# Patient Record
Sex: Female | Born: 1965 | Race: Black or African American | Hispanic: No | Marital: Single | Smoking: Light tobacco smoker
Health system: Southern US, Community
[De-identification: ages and names within clinical notes are randomized; demographics above are authoritative.]

## PROBLEM LIST (undated history)

## (undated) DIAGNOSIS — R7303 Prediabetes: Secondary | ICD-10-CM

## (undated) DIAGNOSIS — E669 Obesity, unspecified: Secondary | ICD-10-CM

## (undated) DIAGNOSIS — G4733 Obstructive sleep apnea (adult) (pediatric): Secondary | ICD-10-CM

## (undated) DIAGNOSIS — E119 Type 2 diabetes mellitus without complications: Secondary | ICD-10-CM

## (undated) DIAGNOSIS — I10 Essential (primary) hypertension: Secondary | ICD-10-CM

## (undated) DIAGNOSIS — E785 Hyperlipidemia, unspecified: Secondary | ICD-10-CM

## (undated) DIAGNOSIS — Z720 Tobacco use: Secondary | ICD-10-CM

## (undated) DIAGNOSIS — G4730 Sleep apnea, unspecified: Secondary | ICD-10-CM

## (undated) HISTORY — DX: Obstructive sleep apnea (adult) (pediatric): G47.33

## (undated) HISTORY — DX: Prediabetes: R73.03

## (undated) HISTORY — DX: Hyperlipidemia, unspecified: E78.5

## (undated) HISTORY — DX: Obesity, unspecified: E66.9

## (undated) HISTORY — DX: Tobacco use: Z72.0

## (undated) HISTORY — DX: Sleep apnea, unspecified: G47.30

## (undated) HISTORY — DX: Type 2 diabetes mellitus without complications: E11.9

---

## 2012-06-30 ENCOUNTER — Emergency Department (INDEPENDENT_AMBULATORY_CARE_PROVIDER_SITE_OTHER)
Admission: EM | Admit: 2012-06-30 | Discharge: 2012-06-30 | Disposition: A | Discharge status: Home / Self Care | Payer: Self-pay | Source: Home / Self Care | Attending: Family Medicine | Admitting: Family Medicine

## 2012-06-30 ENCOUNTER — Encounter (HOSPITAL_COMMUNITY): Payer: Self-pay

## 2012-06-30 DIAGNOSIS — I10 Essential (primary) hypertension: Secondary | ICD-10-CM

## 2012-06-30 HISTORY — DX: Essential (primary) hypertension: I10

## 2012-06-30 LAB — COMPREHENSIVE METABOLIC PANEL
BUN: 10 mg/dL (ref 6–23)
CO2: 27 mEq/L (ref 19–32)
Chloride: 103 mEq/L (ref 96–112)
Creatinine, Ser: 0.65 mg/dL (ref 0.50–1.10)
GFR calc non Af Amer: >90 mL/min (ref >90)
Glucose, Bld: 88 mg/dL (ref 70–99)
Total Bilirubin: 0.3 mg/dL (ref 0.3–1.2)

## 2012-06-30 LAB — LIPID PANEL
Cholesterol: 200 mg/dL (ref 0–200)
Triglycerides: 152 mg/dL — ABNORMAL HIGH (ref <150)

## 2012-06-30 LAB — CBC
HCT: 40.4 % (ref 36.0–46.0)
Hemoglobin: 13.9 g/dL (ref 12.0–15.0)
MCH: 33.7 pg (ref 26.0–34.0)
MCHC: 34.4 g/dL (ref 30.0–36.0)

## 2012-06-30 MED ORDER — METOPROLOL TARTRATE 50 MG PO TABS: 50.0000 mg | ORAL_TABLET | Freq: Every day | ORAL | Status: DC

## 2012-06-30 MED ORDER — LISINOPRIL 20 MG PO TABS: 20.0000 mg | ORAL_TABLET | Freq: Every day | ORAL | Status: DC

## 2012-06-30 MED ORDER — AMLODIPINE BESYLATE 5 MG PO TABS: 5.0000 mg | ORAL_TABLET | Freq: Every day | ORAL | Status: DC

## 2012-06-30 NOTE — ED Notes (Signed)
Patient states has a history of hypertension -needs medication refill

## 2012-06-30 NOTE — ED Provider Notes (Signed)
History     CSN: [id number]  Arrival date & time 06/30/12  1559   First MD Initiated Contact with Patient 06/30/12 1600      Chief Complaint  Patient presents with  . Medication Refill    (Consider location/radiation/quality/duration/timing/severity/associated sxs/prior treatment) HPI Pt says that she is out of her medications.  She takes 3 different BP medications. Pt says that she just relocated to this area.  Pt says that she is not having any other symptoms.     Past Medical History  Diagnosis Date  . Hypertension    History reviewed. No pertinent past surgical history.  No family history on file.  History  Substance Use Topics  . Smoking status: Light Tobacco Smoker  . Smokeless tobacco: Not on file  . Alcohol Use: Yes    OB History    Grav Para Term Preterm Abortions TAB SAB Ect Mult Living                  Review of Systems  Constitutional: Positive for fatigue.  Neurological: Positive for light-headedness and headaches.  All other systems reviewed and are negative.    Allergies  Review of patient's allergies indicates no known allergies.  Home Medications   Current Outpatient Rx  Name  Route  Sig  Dispense  Refill  . AMLODIPINE BESYLATE 5 MG PO TABS   Oral   Take 5 mg by mouth daily.         Marland Kitchen LISINOPRIL 20 MG PO TABS   Oral   Take 20 mg by mouth daily.         Marland Kitchen METOPROLOL TARTRATE 50 MG PO TABS   Oral   Take 50 mg by mouth once.           BP 151/91  Pulse 84  Temp 98.5 F (36.9 C) (Oral)  Resp 16  SpO2 100%  Physical Exam  Nursing note and vitals reviewed. Constitutional: She is oriented to person, place, and time. She appears well-developed and well-nourished.  HENT:  Head: Normocephalic and atraumatic.  Eyes: EOM are normal. Pupils are equal, round, and reactive to light.  Neck: Normal range of motion. Neck supple. No JVD present. No tracheal deviation present. No thyromegaly present.  Cardiovascular: Normal rate,  regular rhythm and normal heart sounds.   Pulmonary/Chest: Effort normal and breath sounds normal.  Abdominal: Soft. Bowel sounds are normal.  Musculoskeletal: Normal range of motion. She exhibits no edema and no tenderness.  Lymphadenopathy:    She has no cervical adenopathy.  Neurological: She is alert and oriented to person, place, and time. She has normal reflexes.  Skin: Skin is warm and dry.  Psychiatric: She has a normal mood and affect. Judgment and thought content normal.   ED Course  Procedures (including critical care time)  Labs Reviewed - No data to display No results found.  No diagnosis found.   MDM  IMPRESSION  Hypertension  Hyperlipidemia  RECOMMENDATIONS / PLAN Refilled medications today. Follow up on results.  Send for old records.   FOLLOW UP 2 months   The patient was given clear instructions to go to ER or return to medical center if symptoms don't improve, worsen or new problems develop.  The patient verbalized understanding.  The patient was told to call to get lab results if they haven't heard anything in the next week.            Cleora Fleet, MD 06/30/12 1826

## 2012-07-01 ENCOUNTER — Telehealth (HOSPITAL_COMMUNITY): Payer: Self-pay

## 2012-07-01 NOTE — ED Notes (Signed)
Received records  [place]

## 2012-07-01 NOTE — Progress Notes (Signed)
Quick Note:  Please notify patient that her labs came back okay. She should have her labs rechecked again in 3-4 months.   Rodney Langton, MD, CDE, FAAFP [facility] [place], [place]   ______

## 2012-07-01 NOTE — Telephone Encounter (Signed)
Message copied by Lestine Mount on Wed Jul 01, 2012 12:40 PM ------      Message from: Cleora Fleet      Created: Wed Jul 01, 2012 10:57 AM       Please notify patient that her labs came back okay.  She should have her labs rechecked again in 3-4 months.                  Rodney Langton, MD, CDE, FAAFP      [facility]      [facility]      [place], [place]

## 2012-07-07 NOTE — ED Notes (Signed)
Pt ccalled requesting pravastatin  But did not know the strength asked  Her to have pharmacy fax [place] the information

## 2012-07-15 MED ORDER — PRAVASTATIN SODIUM 40 MG PO TABS: 40.0000 mg | ORAL_TABLET | Freq: Every day | ORAL | Status: DC

## 2012-07-15 NOTE — Progress Notes (Signed)
The patient called and reported that she was taking pravastatin 40 mg po daily from her previous provider.  She is requesting for a refill to be sent to [facility] on [address].    Sent electronically on 07/15/12.   Rodney Langton, MD, CDE, FAAFP [facility] [place], [place]

## 2012-11-13 ENCOUNTER — Encounter: Payer: Self-pay | Admitting: Family Medicine

## 2012-11-13 NOTE — Telephone Encounter (Signed)
error 

## 2012-12-11 ENCOUNTER — Ambulatory Visit: Payer: Self-pay | Attending: Family Medicine | Admitting: Internal Medicine

## 2012-12-11 VITALS — BP 151/100 | HR 88 | Temp 99.0°F | Resp 20 | Ht 67.0 in | Wt 246.0 lb

## 2012-12-11 DIAGNOSIS — I10 Essential (primary) hypertension: Secondary | ICD-10-CM | POA: Insufficient documentation

## 2012-12-11 MED ORDER — PRAVASTATIN SODIUM 40 MG PO TABS: 40.0000 mg | ORAL_TABLET | Freq: Every day | ORAL | Status: DC

## 2012-12-11 MED ORDER — LISINOPRIL 20 MG PO TABS: 20.0000 mg | ORAL_TABLET | Freq: Every day | ORAL | Status: DC

## 2012-12-11 MED ORDER — METOPROLOL TARTRATE 50 MG PO TABS: 50.0000 mg | ORAL_TABLET | Freq: Every day | ORAL | Status: DC

## 2012-12-11 MED ORDER — AMLODIPINE BESYLATE 10 MG PO TABS: 10.0000 mg | ORAL_TABLET | Freq: Every day | ORAL | Status: DC

## 2012-12-11 NOTE — Progress Notes (Signed)
PT HERE FOR RX REFILL BP/CHOLOESTEROL MEDS. ALSO C/O RIGHT LEG WITH SWELLING AND L SHOULDER DISCOMFORT THAT STARTS IN BACKL OF NECK.SX STARTED SINCE LAST YR

## 2012-12-11 NOTE — Progress Notes (Signed)
Patient ID: Virginia Levine, female   DOB: September 22, 1965, 47 y.o.   MRN: [id number]  Patient Demographics  Virginia Levine, is a 47 y.o. female  NWG:[id number]  [id number]  DOB - 01/13/1966  Chief Complaint  Patient presents with  . Medication Refill    BP,CHOLESTEROL MEDS  . Leg Pain    R LEG/LEFT SHOULDER        Subjective:   Virginia Levine with History of hypertension, dyslipidemia  is here for routine followup visit and to get her medications refilled.  Denies any subjective complaints except as above, no active headache, no chest abdominal pain at this time, not short of breath. No focal weakness which is new.   Objective:    Patient Active Problem List   Diagnosis Date Noted  . HTN (hypertension) 12/11/2012     Filed Vitals:   12/11/12 1550  BP: 151/100  Pulse: 88  Temp: 99 F (37.2 C)  TempSrc: Oral  Resp: 20  Height: 5\' 7"  (1.702 m)  Weight: 246 lb (111.585 kg)  SpO2: 99%     Exam   Awake Alert, Oriented X 3, No new F.N deficits, Normal affect [name].AT,PERRAL Supple Neck,No JVD, No cervical lymphadenopathy appriciated.  Symmetrical Chest wall movement, Good air movement bilaterally, CTAB RRR,No Gallops,Rubs or new Murmurs, No Parasternal Heave +ve B.Sounds, Abd Soft, Non tender, No organomegaly appriciated, No rebound - guarding or rigidity. No Cyanosis, Clubbing or edema, No new Rash or bruise      Data Review   CBC No results found for this basename: WBC, HGB, HCT, PLT, MCV, MCH, MCHC, RDW, NEUTRABS, LYMPHSABS, MONOABS, EOSABS, BASOSABS, BANDABS, BANDSABD,  in the last 168 hours  Chemistries   No results found for this basename: NA, K, CL, CO2, GLUCOSE, BUN, CREATININE, GFRCGP, CALCIUM, MG, AST, ALT, ALKPHOS, BILITOT,  in the last 168 hours ------------------------------------------------------------------------------------------------------------------ No results found for this basename: HGBA1C,  in the last 72  hours ------------------------------------------------------------------------------------------------------------------ No results found for this basename: CHOL, HDL, LDLCALC, TRIG, CHOLHDL, LDLDIRECT,  in the last 72 hours ------------------------------------------------------------------------------------------------------------------ No results found for this basename: TSH, T4TOTAL, FREET3, T3FREE, THYROIDAB,  in the last 72 hours ------------------------------------------------------------------------------------------------------------------ No results found for this basename: VITAMINB12, FOLATE, FERRITIN, TIBC, IRON, RETICCTPCT,  in the last 72 hours  Coagulation profile  No results found for this basename: INR, PROTIME,  in the last 168 hours     Prior to Admission medications   Medication Sig Start Date End Date Taking? Authorizing Provider  amLODipine (NORVASC) 10 MG tablet Take 1 tablet (10 mg total) by mouth daily. 12/11/12  Yes Leroy Sea, MD  lisinopril (PRINIVIL,ZESTRIL) 20 MG tablet Take 1 tablet (20 mg total) by mouth daily. 12/11/12  Yes Leroy Sea, MD  metoprolol (LOPRESSOR) 50 MG tablet Take 1 tablet (50 mg total) by mouth daily. 12/11/12  Yes Leroy Sea, MD  pravastatin (PRAVACHOL) 40 MG tablet Take 1 tablet (40 mg total) by mouth daily. 12/11/12  Yes Leroy Sea, MD     Assessment & Plan   Hypertension in poor control. Increase Norvasc dose, get back in 2 weeks. Continue other medications unchanged.   Smoking. Counseled to quit smoking, outpatient referral to smoking cessation made   Obesity. Counseled on diet and exercise   Routine health maintenance.  Screening labs. CBC, CMP, TSH, A1c, ordered   Mammogram, Pap smear - referral made   Immunizations shot next visit clinic is out of tetanus shots       SINGH,PRASHANT K  M.D on 12/11/2012 at 4:06 PM

## 2012-12-11 NOTE — Patient Instructions (Signed)
Heart healthy low carbohydrate diet. Low impact aerobic exercise 30 minutes a day 5 times a week.   Him back in 2 weeks

## 2012-12-12 LAB — TSH: TSH: 1.261 u[IU]/mL (ref 0.350–4.500)

## 2012-12-12 LAB — CBC

## 2012-12-12 LAB — HEMOGLOBIN A1C

## 2012-12-12 LAB — BASIC METABOLIC PANEL
CO2: 22 mEq/L (ref 19–32)
Glucose, Bld: 95 mg/dL (ref 70–99)
Potassium: 4.2 mEq/L (ref 3.5–5.3)
Sodium: 139 mEq/L (ref 135–145)

## 2012-12-22 ENCOUNTER — Encounter: Payer: Self-pay | Admitting: Obstetrics and Gynecology

## 2012-12-25 ENCOUNTER — Ambulatory Visit: Payer: Self-pay | Admitting: Family Medicine

## 2013-01-21 ENCOUNTER — Ambulatory Visit (INDEPENDENT_AMBULATORY_CARE_PROVIDER_SITE_OTHER): Payer: Self-pay | Admitting: Obstetrics and Gynecology

## 2013-01-21 ENCOUNTER — Encounter: Payer: Self-pay | Admitting: Obstetrics and Gynecology

## 2013-01-21 VITALS — BP 149/98 | HR 106 | Ht 67.0 in | Wt 243.8 lb

## 2013-01-21 DIAGNOSIS — Z01419 Encounter for gynecological examination (general) (routine) without abnormal findings: Secondary | ICD-10-CM

## 2013-01-21 DIAGNOSIS — F17200 Nicotine dependence, unspecified, uncomplicated: Secondary | ICD-10-CM

## 2013-01-21 DIAGNOSIS — E785 Hyperlipidemia, unspecified: Secondary | ICD-10-CM

## 2013-01-21 DIAGNOSIS — E669 Obesity, unspecified: Secondary | ICD-10-CM

## 2013-01-21 MED ORDER — METOPROLOL TARTRATE 50 MG PO TABS: 50.0000 mg | ORAL_TABLET | Freq: Every day | ORAL | Status: DC

## 2013-01-21 MED ORDER — LISINOPRIL 20 MG PO TABS: 20.0000 mg | ORAL_TABLET | Freq: Every day | ORAL | Status: DC

## 2013-01-21 MED ORDER — AMLODIPINE BESYLATE 10 MG PO TABS: 10.0000 mg | ORAL_TABLET | Freq: Every day | ORAL | Status: DC

## 2013-01-21 MED ORDER — PRAVASTATIN SODIUM 40 MG PO TABS: 40.0000 mg | ORAL_TABLET | Freq: Every day | ORAL | Status: DC

## 2013-01-21 NOTE — Progress Notes (Signed)
CC: Gynecologic Exam      HPI Virginia Levine is a 47 y.o. G3P0030  who Is referred from her primary physician for GYN exam. She is being followed for chronic hypertension on multiple agents and has dyslipidemia. Needs refills.  GYN Hx: neg for STIs, abnormal bleeding, fibroids, GYN surgeries. Has not had mammogram for 2 years. All have been negative.  LMP 11/21/12 Now feels premenstrual and this has been the first late menses. Not sexually active x 2 years. Last Pap negative per pt about 2 years ago.   Past Medical History  Diagnosis Date  . Hypertension     OB History  Gravida Para Term Preterm AB SAB TAB Ectopic Multiple Living  3 0 0 0 3 2 1    0    # Outcome Date GA Lbr Len/2nd Weight Sex Delivery Anes PTL Lv  3 SAB           2 SAB           1 TAB             FH negative for breast, ovarian, endometrial, colon CA   History reviewed. No pertinent past surgical history.  History   Social History  . Marital Status: Single    Spouse Name: N/A    Number of Children: N/A  . Years of Education: N/A   Occupational History  . Not on file.   Social History Main Topics  . Smoking status: Light Tobacco Smoker  . Smokeless tobacco: Never Used  . Alcohol Use: Yes  . Drug Use: No  . Sexual Activity: No   Other Topics Concern  . Not on file   Social History Narrative  . No narrative on file    Current Outpatient Prescriptions on File Prior to Visit  Medication Sig Dispense Refill  . amLODipine (NORVASC) 10 MG tablet Take 1 tablet (10 mg total) by mouth daily.  30 tablet  3  . lisinopril (PRINIVIL,ZESTRIL) 20 MG tablet Take 1 tablet (20 mg total) by mouth daily.  30 tablet  3  . metoprolol (LOPRESSOR) 50 MG tablet Take 1 tablet (50 mg total) by mouth daily.  30 tablet  3  . pravastatin (PRAVACHOL) 40 MG tablet Take 1 tablet (40 mg total) by mouth daily.  30 tablet  3   No current facility-administered medications on file prior to visit.    No Known  Allergies  ROS Pertinent items in HPI  PHYSICAL EXAM Filed Vitals:   01/21/13 1528  BP: 149/98  Pulse: 106   General: Well nourished, well developed female in no acute distress Cardiovascular: Normal rate Respiratory: Normal effort Neck: no thyromegaly Breasts: symmetric, no nipple discharge, no discrete masses, no lymphadenopathy Abdomen: Soft, nontender Back: No CVAT Extremities: No edema Neurologic: Alert and oriented Speculum exam: NEFG; vagina with malodorous  discharge, no blood; cervix clean Bimanual exam: cervix closed, no CMT; uterus Unable to outline due to body habitus; no adnexal tenderness or masses   LAB  Pap and WP sent    ASSESSMENT GYN exam 1. Obesity   2. Dyslipidemia   CHTN not well controlled  PLAN      Medication List       This list is accurate as of: 01/21/13  3:42 PM.  Always use your most recent med list.               amLODipine 10 MG tablet  Commonly known as:  NORVASC  Take 1 tablet (10 mg  total) by mouth daily.     lisinopril 20 MG tablet  Commonly known as:  PRINIVIL,ZESTRIL  Take 1 tablet (20 mg total) by mouth daily.     metoprolol 50 MG tablet  Commonly known as:  LOPRESSOR  Take 1 tablet (50 mg total) by mouth daily.     pravastatin 40 MG tablet  Commonly known as:  PRAVACHOL  Take 1 tablet (40 mg total) by mouth daily.      Above meds reordered Advised to call Dr. Thedore Mins for  F/U asap since BP not controlled.  Mamogram scholarship application done. F/U 1 year or prn  Danae Orleans, CNM 01/21/2013 3:41 PM

## 2013-01-21 NOTE — Addendum Note (Signed)
Addended by: Kathee Delton on: 01/21/2013 04:13 PM   Modules accepted: Orders

## 2013-01-21 NOTE — Addendum Note (Signed)
Addended by: Kathee Delton on: 01/21/2013 04:17 PM   Modules accepted: Orders

## 2013-01-21 NOTE — Progress Notes (Signed)
Last pap 2-3 years ago. Last mammogram was 4 years ago. Having a heavy discharge with odor. Out of BP meds, hasn't taken any of them today.

## 2013-01-21 NOTE — Addendum Note (Signed)
Addended by: HILLMAN, CARRIE L on: 01/21/2013 04:21 PM   Modules accepted: Orders  

## 2013-01-21 NOTE — Patient Instructions (Addendum)
Mammogram Tips Healthy women should begin getting mammograms every year or two once they reach age 49, and once a year when they reach age 57. Here are tips:  Find an experienced, high-volume center with accomplished radiologists. You can ask for their credentials.  Ask to see the certificate showing the center is approved by the U.S. Food and Drug Administration.  Use the same center regularly, so it is easier to compare your new mammograms with your old ones.  Bring a list of places you have had mammograms, dates, biopsies or other breast treatments. Bring old mammograms with you or have them sent to your primary caregiver.  Describe any breast problems to your caregiver or the person doing the mammogram. Be ready to give past surgeries, birth control pills, hormone use, breast implants, growths, moles, breast scars and family or personal history of breast cancer.  Call your doctor or center to check on the mammogram if you hear nothing within 10 days. Do not assume everything was normal.  To protect your privacy, the mammogram results cannot be given over the phone or to anyone but you.  Radiation from a mammogram is very low and does not pose a radiation risk.  Mammograms can detect breast problems other than breast cancer.  You may be asked stand or sit in front of the X-ray machine.  Two small plastic or glass plates are placed around the breast when taking the X-ray.  If you are menstruating, schedule your mammogram a week after your menstrual period.  Do not wear deodorants, powder or perfume when getting a mammogram.  Wash your breasts and under your arms before getting a mammogram.  Wear cloths that are easy for you to undress and dress.  Arrive at the center at least 15 minutes before the mammogram is scheduled.  There may be slight discomfort during the mammogram, but it goes away shortly after the test.  Try to relax as much as possible during the mammogram.  Talk  to your caregiver if you do not understand the results of the mammogram.  Follow the recommendations of your caregiver regarding further tests and treatments if needed.  Get a second opinion if you are concerned or question the results of the mammogram, further tests or treatment if needed.  Continue with monthly self-breast exams and yearly caregiver exams even if the mammogram is normal.  Your caregiver may recommend getting a mammogram before age 65 and more often if you are at high risk for developing breast cancer. Document Released: 09/05/2005 Document Revised: 08/12/2011 Document Reviewed: 05/13/2008 [facility] Patient Information 2014 [place], [place]. Health Maintenance, Females A healthy lifestyle and preventative care can promote health and wellness.  Maintain regular health, dental, and eye exams.  Eat a healthy diet. Foods like vegetables, fruits, whole grains, low-fat dairy products, and lean protein foods contain the nutrients you need without too many calories. Decrease your intake of foods high in solid fats, added sugars, and salt. Get information about a proper diet from your caregiver, if necessary.  Regular physical exercise is one of the most important things you can do for your health. Most adults should get at least 150 minutes of moderate-intensity exercise (any activity that increases your heart rate and causes you to sweat) each week. In addition, most adults need muscle-strengthening exercises on 2 or more days a week.   Maintain a healthy weight. The body mass index (BMI) is a screening tool to identify possible weight problems. It provides an estimate of  body fat based on height and weight. Your caregiver can help determine your BMI, and can help you achieve or maintain a healthy weight. For adults 20 years and older:  A BMI below 18.5 is considered underweight.  A BMI of 18.5 to 24.9 is normal.  A BMI of 25 to 29.9 is considered overweight.  A BMI of 30 and  above is considered obese.  Maintain normal blood lipids and cholesterol by exercising and minimizing your intake of saturated fat. Eat a balanced diet with plenty of fruits and vegetables. Blood tests for lipids and cholesterol should begin at age 88 and be repeated every 5 years. If your lipid or cholesterol levels are high, you are over 50, or you are a high risk for heart disease, you may need your cholesterol levels checked more frequently.Ongoing high lipid and cholesterol levels should be treated with medicines if diet and exercise are not effective.  If you smoke, find out from your caregiver how to quit. If you do not use tobacco, do not start.  If you are pregnant, do not drink alcohol. If you are breastfeeding, be very cautious about drinking alcohol. If you are not pregnant and choose to drink alcohol, do not exceed 1 drink per day. One drink is considered to be 12 ounces (355 mL) of beer, 5 ounces (148 mL) of wine, or 1.5 ounces (44 mL) of liquor.  Avoid use of street drugs. Do not share needles with anyone. Ask for help if you need support or instructions about stopping the use of drugs.  High blood pressure causes heart disease and increases the risk of stroke. Blood pressure should be checked at least every 1 to 2 years. Ongoing high blood pressure should be treated with medicines, if weight loss and exercise are not effective.  If you are 50 to 49 years old, ask your caregiver if you should take aspirin to prevent strokes.  Diabetes screening involves taking a blood sample to check your fasting blood sugar level. This should be done once every 3 years, after age 43, if you are within normal weight and without risk factors for diabetes. Testing should be considered at a younger age or be carried out more frequently if you are overweight and have at least 1 risk factor for diabetes.  Breast cancer screening is essential preventative care for women. You should practice "breast  self-awareness." This means understanding the normal appearance and feel of your breasts and may include breast self-examination. Any changes detected, no matter how small, should be reported to a caregiver. Women in their 46s and 30s should have a clinical breast exam (CBE) by a caregiver as part of a regular health exam every 1 to 3 years. After age 40, women should have a CBE every year. Starting at age 25, women should consider having a mammogram (breast X-ray) every year. Women who have a family history of breast cancer should talk to their caregiver about genetic screening. Women at a high risk of breast cancer should talk to their caregiver about having an MRI and a mammogram every year.  The Pap test is a screening test for cervical cancer. Women should have a Pap test starting at age 56. Between ages 61 and 77, Pap tests should be repeated every 2 years. Beginning at age 32, you should have a Pap test every 3 years as long as the past 3 Pap tests have been normal. If you had a hysterectomy for a problem that was not cancer  or a condition that could lead to cancer, then you no longer need Pap tests. If you are between ages 73 and 92, and you have had normal Pap tests going back 10 years, you no longer need Pap tests. If you have had past treatment for cervical cancer or a condition that could lead to cancer, you need Pap tests and screening for cancer for at least 20 years after your treatment. If Pap tests have been discontinued, risk factors (such as a new sexual partner) need to be reassessed to determine if screening should be resumed. Some women have medical problems that increase the chance of getting cervical cancer. In these cases, your caregiver may recommend more frequent screening and Pap tests.  The human papillomavirus (HPV) test is an additional test that may be used for cervical cancer screening. The HPV test looks for the virus that can cause the cell changes on the cervix. The cells  collected during the Pap test can be tested for HPV. The HPV test could be used to screen women aged 71 years and older, and should be used in women of any age who have unclear Pap test results. After the age of 11, women should have HPV testing at the same frequency as a Pap test.  Colorectal cancer can be detected and often prevented. Most routine colorectal cancer screening begins at the age of 82 and continues through age 46. However, your caregiver may recommend screening at an earlier age if you have risk factors for colon cancer. On a yearly basis, your caregiver may provide home test kits to check for hidden blood in the stool. Use of a small camera at the end of a tube, to directly examine the colon (sigmoidoscopy or colonoscopy), can detect the earliest forms of colorectal cancer. Talk to your caregiver about this at age 70, when routine screening begins. Direct examination of the colon should be repeated every 5 to 10 years through age 58, unless early forms of pre-cancerous polyps or small growths are found.  Hepatitis C blood testing is recommended for all people born from [birth year] through 1967 and any individual with known risks for hepatitis C.  Practice safe sex. Use condoms and avoid high-risk sexual practices to reduce the spread of sexually transmitted infections (STIs). Sexually active women aged 56 and younger should be checked for Chlamydia, which is a common sexually transmitted infection. Older women with new or multiple partners should also be tested for Chlamydia. Testing for other STIs is recommended if you are sexually active and at increased risk.  Osteoporosis is a disease in which the bones lose minerals and strength with aging. This can result in serious bone fractures. The risk of osteoporosis can be identified using a bone density scan. Women ages 86 and over and women at risk for fractures or osteoporosis should discuss screening with their caregivers. Ask your caregiver  whether you should be taking a calcium supplement or vitamin D to reduce the rate of osteoporosis.  Menopause can be associated with physical symptoms and risks. Hormone replacement therapy is available to decrease symptoms and risks. You should talk to your caregiver about whether hormone replacement therapy is right for you.  Use sunscreen with a sun protection factor (SPF) of 30 or greater. Apply sunscreen liberally and repeatedly throughout the day. You should seek shade when your shadow is shorter than you. Protect yourself by wearing long sleeves, pants, a wide-brimmed hat, and sunglasses year round, whenever you are outdoors.  Notify your  caregiver of new moles or changes in moles, especially if there is a change in shape or color. Also notify your caregiver if a mole is larger than the size of a pencil eraser.  Stay current with your immunizations. Document Released: 12/03/2010 Document Revised: 08/12/2011 Document Reviewed: 12/03/2010 [facility] Patient Information 2014 [place], [place]. Smoking Cessation Quitting smoking is important to your health and has many advantages. However, it is not always easy to quit since nicotine is a very addictive drug. Often times, people try 3 times or more before being able to quit. This document explains the best ways for you to prepare to quit smoking. Quitting takes hard work and a lot of effort, but you can do it. ADVANTAGES OF QUITTING SMOKING  You will live longer, feel better, and live better.  Your body will feel the impact of quitting smoking almost immediately.  Within 20 minutes, blood pressure decreases. Your pulse returns to its normal level.  After 8 hours, carbon monoxide levels in the blood return to normal. Your oxygen level increases.  After 24 hours, the chance of having a heart attack starts to decrease. Your breath, hair, and body stop smelling like smoke.  After 48 hours, damaged nerve endings begin to recover. Your sense of  taste and smell improve.  After 72 hours, the body is virtually free of nicotine. Your bronchial tubes relax and breathing becomes easier.  After 2 to 12 weeks, lungs can hold more air. Exercise becomes easier and circulation improves.  The risk of having a heart attack, stroke, cancer, or lung disease is greatly reduced.  After 1 year, the risk of coronary heart disease is cut in half.  After 5 years, the risk of stroke falls to the same as a nonsmoker.  After 10 years, the risk of lung cancer is cut in half and the risk of other cancers decreases significantly.  After 15 years, the risk of coronary heart disease drops, usually to the level of a nonsmoker.  If you are pregnant, quitting smoking will improve your chances of having a healthy baby.  The people you live with, especially any children, will be healthier.  You will have extra money to spend on things other than cigarettes. QUESTIONS TO THINK ABOUT BEFORE ATTEMPTING TO QUIT You may want to talk about your answers with your caregiver.  Why do you want to quit?  If you tried to quit in the past, what helped and what did not?  What will be the most difficult situations for you after you quit? How will you plan to handle them?  Who can help you through the tough times? Your family? Friends? A caregiver?  What pleasures do you get from smoking? What ways can you still get pleasure if you quit? Here are some questions to ask your caregiver:  How can you help me to be successful at quitting?  What medicine do you think would be best for me and how should I take it?  What should I do if I need more help?  What is smoking withdrawal like? How can I get information on withdrawal? GET READY  Set a quit date.  Change your environment by getting rid of all cigarettes, ashtrays, matches, and lighters in your home, car, or work. Do not let people smoke in your home.  Review your past attempts to quit. Think about what  worked and what did not. GET SUPPORT AND ENCOURAGEMENT You have a better chance of being successful if you have  help. You can get support in many ways.  Tell your family, friends, and co-workers that you are going to quit and need their support. Ask them not to smoke around you.  Get individual, group, or telephone counseling and support. Programs are available at [employer] and health centers. Call your local health department for information about programs in your area.  Spiritual beliefs and practices may help some smokers quit.  Download a "quit meter" on your computer to keep track of quit statistics, such as how long you have gone without smoking, cigarettes not smoked, and money saved.  Get a self-help book about quitting smoking and staying off of tobacco. LEARN NEW SKILLS AND BEHAVIORS  Distract yourself from urges to smoke. Talk to someone, go for a walk, or occupy your time with a task.  Change your normal routine. Take a different route to work. Drink tea instead of coffee. Eat breakfast in a different place.  Reduce your stress. Take a hot bath, exercise, or read a book.  Plan something enjoyable to do every day. Reward yourself for not smoking.  Explore interactive web-based programs that specialize in helping you quit. GET MEDICINE AND USE IT CORRECTLY Medicines can help you stop smoking and decrease the urge to smoke. Combining medicine with the above behavioral methods and support can greatly increase your chances of successfully quitting smoking.  Nicotine replacement therapy helps deliver nicotine to your body without the negative effects and risks of smoking. Nicotine replacement therapy includes nicotine gum, lozenges, inhalers, nasal sprays, and skin patches. Some may be available over-the-counter and others require a prescription.  Antidepressant medicine helps people abstain from smoking, but how this works is unknown. This medicine is available by  prescription.  Nicotinic receptor partial agonist medicine simulates the effect of nicotine in your brain. This medicine is available by prescription. Ask your caregiver for advice about which medicines to use and how to use them based on your health history. Your caregiver will tell you what side effects to look out for if you choose to be on a medicine or therapy. Carefully read the information on the package. Do not use any other product containing nicotine while using a nicotine replacement product.  RELAPSE OR DIFFICULT SITUATIONS Most relapses occur within the first 3 months after quitting. Do not be discouraged if you start smoking again. Remember, most people try several times before finally quitting. You may have symptoms of withdrawal because your body is used to nicotine. You may crave cigarettes, be irritable, feel very hungry, cough often, get headaches, or have difficulty concentrating. The withdrawal symptoms are only temporary. They are strongest when you first quit, but they will go away within 10 14 days. To reduce the chances of relapse, try to:  Avoid drinking alcohol. Drinking lowers your chances of successfully quitting.  Reduce the amount of caffeine you consume. Once you quit smoking, the amount of caffeine in your body increases and can give you symptoms, such as a rapid heartbeat, sweating, and anxiety.  Avoid smokers because they can make you want to smoke.  Do not let weight gain distract you. Many smokers will gain weight when they quit, usually less than 10 pounds. Eat a healthy diet and stay active. You can always lose the weight gained after you quit.  Find ways to improve your mood other than smoking. FOR MORE INFORMATION  www.smokefree.gov  Document Released: 05/14/2001 Document Revised: 11/19/2011 Document Reviewed: 08/29/2011 [facility] Patient Information 2014 [place], [place].

## 2013-01-22 ENCOUNTER — Ambulatory Visit: Payer: Self-pay

## 2013-01-22 ENCOUNTER — Other Ambulatory Visit: Payer: Self-pay | Admitting: General Practice

## 2013-01-22 DIAGNOSIS — I10 Essential (primary) hypertension: Secondary | ICD-10-CM

## 2013-01-22 DIAGNOSIS — E785 Hyperlipidemia, unspecified: Secondary | ICD-10-CM

## 2013-01-22 LAB — WET PREP, GENITAL: Clue Cells Wet Prep HPF POC: NONE SEEN

## 2013-01-22 MED ORDER — PRAVASTATIN SODIUM 40 MG PO TABS: 40.0000 mg | ORAL_TABLET | Freq: Every day | ORAL | Status: DC

## 2013-01-22 MED ORDER — METOPROLOL TARTRATE 50 MG PO TABS: 50.0000 mg | ORAL_TABLET | Freq: Every day | ORAL | Status: DC

## 2013-01-22 MED ORDER — AMLODIPINE BESYLATE 10 MG PO TABS: 10.0000 mg | ORAL_TABLET | Freq: Every day | ORAL | Status: DC

## 2013-01-22 MED ORDER — LISINOPRIL 20 MG PO TABS: 20.0000 mg | ORAL_TABLET | Freq: Every day | ORAL | Status: DC

## 2013-02-23 ENCOUNTER — Other Ambulatory Visit: Payer: Self-pay | Admitting: Family Medicine

## 2013-02-25 NOTE — Telephone Encounter (Signed)
Medication refill- zocor 

## 2014-04-04 ENCOUNTER — Encounter: Payer: Self-pay | Admitting: Obstetrics and Gynecology

## 2015-01-11 DIAGNOSIS — E785 Hyperlipidemia, unspecified: Secondary | ICD-10-CM | POA: Insufficient documentation

## 2015-01-11 DIAGNOSIS — B372 Candidiasis of skin and nail: Secondary | ICD-10-CM | POA: Insufficient documentation

## 2015-03-24 DIAGNOSIS — K625 Hemorrhage of anus and rectum: Secondary | ICD-10-CM | POA: Insufficient documentation

## 2015-03-24 DIAGNOSIS — N938 Other specified abnormal uterine and vaginal bleeding: Secondary | ICD-10-CM | POA: Insufficient documentation

## 2015-06-04 DIAGNOSIS — J189 Pneumonia, unspecified organism: Secondary | ICD-10-CM | POA: Insufficient documentation

## 2015-06-04 DIAGNOSIS — J810 Acute pulmonary edema: Secondary | ICD-10-CM | POA: Insufficient documentation

## 2016-06-03 HISTORY — PX: LEEP: SHX91

## 2016-11-26 DIAGNOSIS — Z8742 Personal history of other diseases of the female genital tract: Secondary | ICD-10-CM | POA: Insufficient documentation

## 2018-03-16 DIAGNOSIS — K219 Gastro-esophageal reflux disease without esophagitis: Secondary | ICD-10-CM | POA: Insufficient documentation

## 2018-07-06 DIAGNOSIS — R748 Abnormal levels of other serum enzymes: Secondary | ICD-10-CM | POA: Insufficient documentation

## 2018-12-01 DIAGNOSIS — R140 Abdominal distension (gaseous): Secondary | ICD-10-CM | POA: Insufficient documentation

## 2019-12-14 ENCOUNTER — Other Ambulatory Visit: Payer: Self-pay

## 2019-12-14 ENCOUNTER — Encounter: Payer: Self-pay | Admitting: Family Medicine

## 2019-12-14 ENCOUNTER — Ambulatory Visit (INDEPENDENT_AMBULATORY_CARE_PROVIDER_SITE_OTHER): Payer: Self-pay | Admitting: Family Medicine

## 2019-12-14 VITALS — BP 136/90 | HR 82 | Ht 67.0 in | Wt 294.0 lb

## 2019-12-14 DIAGNOSIS — Z1211 Encounter for screening for malignant neoplasm of colon: Secondary | ICD-10-CM

## 2019-12-14 DIAGNOSIS — E785 Hyperlipidemia, unspecified: Secondary | ICD-10-CM

## 2019-12-14 DIAGNOSIS — I10 Essential (primary) hypertension: Secondary | ICD-10-CM

## 2019-12-14 DIAGNOSIS — Z720 Tobacco use: Secondary | ICD-10-CM

## 2019-12-14 DIAGNOSIS — N898 Other specified noninflammatory disorders of vagina: Secondary | ICD-10-CM

## 2019-12-14 DIAGNOSIS — R7303 Prediabetes: Secondary | ICD-10-CM

## 2019-12-14 DIAGNOSIS — Z7689 Persons encountering health services in other specified circumstances: Secondary | ICD-10-CM

## 2019-12-14 DIAGNOSIS — G4733 Obstructive sleep apnea (adult) (pediatric): Secondary | ICD-10-CM

## 2019-12-14 MED ORDER — CARVEDILOL 25 MG PO TABS: 25.0000 mg | ORAL_TABLET | Freq: Two times a day (BID) | ORAL | 3 refills | Status: DC

## 2019-12-14 MED ORDER — METFORMIN HCL ER 500 MG PO TB24: 500.0000 mg | ORAL_TABLET | Freq: Every day | ORAL | 3 refills | Status: DC

## 2019-12-14 NOTE — Patient Instructions (Signed)
Thank you for coming to see me today. It was a pleasure. Today we talked about:   Come back for lab work and to talk about your other.  I have placed a referral to Gynecology and GI.  If you do not hear from them in the next 2 weeks, please give [place] a call.  Please follow-up with me in 1-2 months.  If you have any questions or concerns, please do not hesitate to call the office at [phone].  Best,   Luis Abed, DO

## 2019-12-14 NOTE — Progress Notes (Signed)
SUBJECTIVE:   CHIEF COMPLAINT / HPI:   Establish Care Patient presents today to establish care She works for [employer] at the old [facility]  Originally from [place], lived in the [place] She likes the [place] Then she moved to [place], [place], [place], then [place]  Had bradycardia 2015 while hospitalized in [place] with pneumonia She was supposed to follow up with a cardiologist after a year, but she has not done this in quite some time  Hypertension Currently on carvedilol 25 mg twice daily, Norvasc 5 mg daily, hydrochlorothiazide 25 mg daily.  Patient also takes potassium chloride 20 mg daily States that blood pressure has been well controlled.  No chest pain or trouble breathing.  HLD Currently on atorvastatin 20 mg daily Has been tolerating this well  Pre-diabetes On Metformin 500 mg daily Has been tolerating this well  OSA Has been using one that she bought from someone but thinks that she needs a new one Has sleep study in care everywhere from 12/24/2016 and 11/27/2016, was diagnosed with severe OSA  Tobacco Abuse Reports that she smokes more when she drinks more, about once a month.  States other than that, is usually around 5 to 6 cigarettes a day.  Vaginal discharge Had a LEEP in 2018 Last Pap 2019 with neg pap and neg HPV in Care everywhere LMP many years ago, is not sure when. Patient wants referral to gynecology because of long history of vaginal discharge and wants to see gynecology specifically   PERTINENT  PMH / PSH: Hypertension, hyperlipidemia, prediabetes, OSA, tobacco abuse, morbid obesity  OBJECTIVE:   BP 136/90   Pulse 82   Ht 5\' 7"  (1.702 m)   Wt 294 lb (133.4 kg)   LMP 11/21/2012   SpO2 98%   BMI 46.05 kg/m    Physical Exam:  General: 54 y.o. female in NAD Cardio: RRR no m/r/g Lungs: CTAB, no wheezing, no rhonchi, no crackles, no IWOB on RA Skin: warm and dry Extremities: No edema   ASSESSMENT/PLAN:    Encounter to establish care with new doctor New patient packet reviewed with patient.  Patient's past medical, surgical, family, social history reviewed and updated in chart, medications also reviewed and updated in chart.  Plan to address more health maintenance at patient's follow-up visit.  Advised to follow-up in 1 month.  HTN (hypertension) Patient is currently on Coreg 25 mg twice daily, Norvasc 5 mg daily, HCTZ 25 mg daily and potassium chloride 20 mg daily.  Provided refill for Coreg at patient request.  We will plan to perform BMP at her next visit.  Pre-diabetes Patient does meet criteria for treatment with Metformin for prediabetes.  She is requesting refill for Metformin, refill sent.  Will perform A1c at next visit.  Dyslipidemia Currently on Lipitor 20 mg daily.  On chart review it appears the last lipid panel was June 2020.  Can repeat at next visit.  Continue with Lipitor.  OSA (obstructive sleep apnea) Did see where patient has a sleep study in her chart and diagnosis of severe OSA.  She does not have her own CPAP machine, therefore will order.  Order sent to nurse clinic.  Tobacco abuse Patient currently smoking about 5 to 6 cigarettes a day.  Can work on cessation if patient agrees.  Continue to encourage discontinuing.  Vaginal discharge Patient is requesting referral for gynecologic care.  Referral placed.  Screen for colon cancer Referral placed for colonoscopy.  Patient given colonoscopy form to schedule.  Cleophas Dunker, [place]

## 2019-12-15 DIAGNOSIS — R7303 Prediabetes: Secondary | ICD-10-CM | POA: Insufficient documentation

## 2019-12-15 DIAGNOSIS — Z7689 Persons encountering health services in other specified circumstances: Secondary | ICD-10-CM | POA: Insufficient documentation

## 2019-12-15 DIAGNOSIS — N898 Other specified noninflammatory disorders of vagina: Secondary | ICD-10-CM | POA: Insufficient documentation

## 2019-12-15 DIAGNOSIS — Z1211 Encounter for screening for malignant neoplasm of colon: Secondary | ICD-10-CM | POA: Insufficient documentation

## 2019-12-15 DIAGNOSIS — G4733 Obstructive sleep apnea (adult) (pediatric): Secondary | ICD-10-CM | POA: Insufficient documentation

## 2019-12-15 MED ORDER — AMLODIPINE BESYLATE 5 MG PO TABS: 5.0000 mg | ORAL_TABLET | Freq: Every day | ORAL | Status: DC

## 2019-12-15 NOTE — Assessment & Plan Note (Signed)
Did see where patient has a sleep study in her chart and diagnosis of severe OSA.  She does not have her own CPAP machine, therefore will order.  Order sent to nurse clinic.

## 2019-12-15 NOTE — Assessment & Plan Note (Signed)
Patient is requesting referral for gynecologic care.  Referral placed.

## 2019-12-15 NOTE — Assessment & Plan Note (Signed)
Currently on Lipitor 20 mg daily.  On chart review it appears the last lipid panel was June 2020.  Can repeat at next visit.  Continue with Lipitor.

## 2019-12-15 NOTE — Assessment & Plan Note (Signed)
Patient is currently on Coreg 25 mg twice daily, Norvasc 5 mg daily, HCTZ 25 mg daily and potassium chloride 20 mg daily.  Provided refill for Coreg at patient request.  We will plan to perform BMP at her next visit.

## 2019-12-15 NOTE — Assessment & Plan Note (Signed)
Patient currently smoking about 5 to 6 cigarettes a day.  Can work on cessation if patient agrees.  Continue to encourage discontinuing.

## 2019-12-15 NOTE — Assessment & Plan Note (Signed)
New patient packet reviewed with patient.  Patient's past medical, surgical, family, social history reviewed and updated in chart, medications also reviewed and updated in chart.  Plan to address more health maintenance at patient's follow-up visit.  Advised to follow-up in 1 month.

## 2019-12-15 NOTE — Assessment & Plan Note (Signed)
Patient does meet criteria for treatment with Metformin for prediabetes.  She is requesting refill for Metformin, refill sent.  Will perform A1c at next visit.

## 2019-12-15 NOTE — Assessment & Plan Note (Signed)
Referral placed for colonoscopy.  Patient given colonoscopy form to schedule.

## 2019-12-16 ENCOUNTER — Telehealth: Payer: Self-pay

## 2019-12-16 MED ORDER — METFORMIN HCL ER 500 MG PO TB24: 500.0000 mg | ORAL_TABLET | Freq: Every day | ORAL | 3 refills | Status: DC

## 2019-12-16 MED ORDER — CARVEDILOL 25 MG PO TABS: 25.0000 mg | ORAL_TABLET | Freq: Two times a day (BID) | ORAL | 3 refills | Status: DC

## 2019-12-16 NOTE — Progress Notes (Signed)
Patient calls nurse line requesting transfer of [facility] from [facility] to [facility]. Called [facility], spoke with Zollie Scale, and cancelled original rx. Resent to [facility] pharmacy upon patient request.   Veronda Prude, RN

## 2019-12-16 NOTE — Telephone Encounter (Signed)
See below message from [place] at [facility].   Received, Thank you!    Veronda Prude, RN

## 2019-12-16 NOTE — Telephone Encounter (Signed)
Community message sent to [facility]. Will await response.   Marlia Schewe C Lenka Zhao, RN  

## 2019-12-16 NOTE — Addendum Note (Signed)
Addended by: Veronda Prude on: 12/16/2019 02:03 PM   Modules accepted: Orders

## 2019-12-20 ENCOUNTER — Other Ambulatory Visit: Payer: Self-pay | Admitting: Family Medicine

## 2019-12-20 ENCOUNTER — Telehealth: Payer: Self-pay

## 2019-12-20 DIAGNOSIS — I10 Essential (primary) hypertension: Secondary | ICD-10-CM

## 2019-12-20 MED ORDER — POTASSIUM CHLORIDE CRYS ER 20 MEQ PO TBCR: 20.0000 meq | EXTENDED_RELEASE_TABLET | Freq: Two times a day (BID) | ORAL | 1 refills | Status: DC

## 2019-12-20 NOTE — Telephone Encounter (Signed)
Rx sent 

## 2019-12-20 NOTE — Telephone Encounter (Signed)
Patient calls nurse line reporting her medications were sent to the wrong pharmacy at recent visit. Looks like pharmacy was entered into epic wrong. I have confirmed with original pharmacy that they have transferred to preferred [facility] on [address]   Patient requesting a refill on Potassium BID. This was not sent in at her recent office visit. Please advise. I have updated her preferred pharmacy in the system.

## 2019-12-29 ENCOUNTER — Other Ambulatory Visit: Payer: Self-pay | Admitting: Obstetrics and Gynecology

## 2019-12-29 DIAGNOSIS — Z1231 Encounter for screening mammogram for malignant neoplasm of breast: Secondary | ICD-10-CM

## 2020-01-12 ENCOUNTER — Telehealth: Payer: Self-pay

## 2020-01-12 DIAGNOSIS — I10 Essential (primary) hypertension: Secondary | ICD-10-CM

## 2020-01-12 MED ORDER — AMLODIPINE BESYLATE 5 MG PO TABS: 5.0000 mg | ORAL_TABLET | Freq: Every day | ORAL | 0 refills | Status: DC

## 2020-01-12 MED ORDER — HYDROCHLOROTHIAZIDE 25 MG PO TABS: 25.0000 mg | ORAL_TABLET | Freq: Every day | ORAL | 0 refills | Status: DC

## 2020-01-12 MED ORDER — ATORVASTATIN CALCIUM 20 MG PO TABS: 20.0000 mg | ORAL_TABLET | Freq: Every day | ORAL | 0 refills | Status: DC

## 2020-01-12 NOTE — Telephone Encounter (Signed)
Patient needs follow up appointment for labwork and to discuss neck pain.

## 2020-01-12 NOTE — Telephone Encounter (Signed)
Patient calls nurse line for medication refills. Patient reports having a slipped disc in neck and has been having muscle tightness. Patient reports using flexeril in the past. Not on current medication list.   Please advise  To PCP  Veronda Prude, RN

## 2020-01-17 NOTE — Telephone Encounter (Signed)
Called patient and LVM to return call to office to schedule follow up.   Envy Meno C Dakhari Zuver, RN  

## 2020-01-18 NOTE — Telephone Encounter (Signed)
Called patient and scheduled appointment with PCP for 02/10/20 at 1550.   Veronda Prude, RN

## 2020-01-20 ENCOUNTER — Telehealth: Payer: Self-pay | Admitting: Family Medicine

## 2020-01-20 NOTE — Telephone Encounter (Signed)
Patient called and had questions regarding which Covid vaccination she should get [employer] versus [address]  Call patient back to answer question.  Patient was encouraged to obtain any Covid vaccine, father or maternal would be fine.  Advised patient that she will likely have side effects afterwards and that this is a sign of her immune system working properly.  Patient raise concerns that given her comorbidities and heart disease that she would have problems from the vaccine, reassured the patient that the chances of her having problems from Covid are much higher and that protecting herself with the COVID-19 vaccine would be the safest and recommended thing to do.  Patient was thankful for answering her questions and felt reassured.  She plans to get her Covid vaccine tomorrow.

## 2020-01-31 ENCOUNTER — Ambulatory Visit: Payer: Self-pay

## 2020-02-03 ENCOUNTER — Ambulatory Visit: Payer: Self-pay

## 2020-02-03 ENCOUNTER — Ambulatory Visit: Payer: No Typology Code available for payment source | Admitting: *Deleted

## 2020-02-03 ENCOUNTER — Other Ambulatory Visit: Payer: Self-pay

## 2020-02-03 VITALS — Temp 97.1°F | Wt 297.6 lb

## 2020-02-03 DIAGNOSIS — Z01419 Encounter for gynecological examination (general) (routine) without abnormal findings: Secondary | ICD-10-CM

## 2020-02-03 NOTE — Patient Instructions (Addendum)
Explained breast self awareness with Virginia Levine. Pap smear completed today. Let her know next Pap smear will be due based on the result of today's Pap smear. Referred patient to the [facility] for a screening mammogram on the mobile unit. Appointment scheduled Thursday, March 16, 2020 at 1500. Patient aware of appointment and will be there. Let patient know will follow up with her within the next couple weeks with results of Pap smear by letter or phone. Informed patient that the Breast Center will follow-up with her within two weeks after mammogram by letter or phone with results. Discussed smoking cessation. Referred to the [facility] Quitline and gave resources to the free smoking cessation classes at [facility]. Shelley Cocke verbalized understanding.  Tiann Saha, Kathaleen Maser, RN 3:06 PM

## 2020-02-03 NOTE — Progress Notes (Signed)
[name] is a 54 y.o. G2P0020 female who presents to [facility] clinic today with complaint of vaginal odor since LEEP 01/14/2017.    Pap Smear: Pap smear completed today. Last Pap smear was 03/30/2018 and was normal. Per patient has history of an abnormal Pap smear 11/11/2016 that was LGSIL. 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 Last Pap smear result is available in Epic.   Physical exam: Breasts Breasts symmetrical. No skin abnormalities bilateral breasts. No nipple retraction bilateral breasts. No nipple discharge bilateral breasts. No lymphadenopathy. No lumps palpated bilateral breasts. No complaints of pain or tenderness on exam.      Pelvic/Bimanual Ext Genitalia No lesions, no swelling and no discharge observed on external genitalia.      Vagina Vagina pink and normal texture. No lesions or discharge observed in vagina. Patient complained of a vaginal odor since LEEP. Referred patient to the [facility] for [facility] for follow-up. Explained to patient the visit will not be covered by [facility] and patient given the [facility] Paperwork. Follow-up appointment scheduled for Thursday, March 16, 2020 at 1535.          Cervix Cervix is present. Cervix pink and of normal texture. No discharge observed.    Uterus Uterus is present and palpable. Uterus in normal position and normal size.        Adnexae Bilateral ovaries present and unable to palpate. No tenderness on palpation.         Rectovaginal No rectal exam completed today since patient had no rectal complaints. No skin abnormalities observed on exam.     Smoking History: Patient is a current smoker. Discussed smoking cessation. Referred to the [facility] Quitline and gave resources to the free smoking cessation classes at [facility].   Patient Navigation: Patient education provided. Access to services provided for patient through [facility]  program. Transportation provided home from appointment.  Colorectal Cancer Screening: Per patient has had colonoscopy completed on 04/26/2015. Patient stated has been referred for another colonoscopy by her PCP. No complaints today.    Breast and Cervical Cancer Risk Assessment: Patient does not have family history of breast cancer, known genetic mutations, or radiation treatment to the chest before age 86. Patient has history of cervical dysplasia. Patient has no history of being immunocompromised or DES exposure in-utero.  Risk Assessment    Risk Scores      02/03/2020   Last edited by: Narda Rutherford, LPN   5-year risk: 1.4 %   Lifetime risk: 7.7 %          A: [facility] exam with pap smear Complaint of vaginal odor since LEEP.  P: Referred patient to the [facility] for a screening mammogram on the mobile unit. Appointment scheduled Thursday, March 16, 2020 at 1500.  Priscille Heidelberg, RN 02/03/2020 4:43 PM

## 2020-02-09 ENCOUNTER — Telehealth: Payer: Self-pay

## 2020-02-09 LAB — CYTOLOGY - PAP
Comment: NEGATIVE
Diagnosis: UNDETERMINED — AB
High risk HPV: NEGATIVE

## 2020-02-09 NOTE — Telephone Encounter (Addendum)
Patient informed pap results, ASC-US, repeat pap & co-testing in 1 year. Patient verbalized understanding.  ----- Message from Priscille Heidelberg, RN sent at 02/09/2020 10:21 AM EDT ----- Pap and co-testing 1 year.

## 2020-02-10 ENCOUNTER — Ambulatory Visit (INDEPENDENT_AMBULATORY_CARE_PROVIDER_SITE_OTHER): Payer: Self-pay | Admitting: Family Medicine

## 2020-02-10 ENCOUNTER — Encounter: Payer: Self-pay | Admitting: Family Medicine

## 2020-02-10 ENCOUNTER — Other Ambulatory Visit: Payer: Self-pay

## 2020-02-10 VITALS — BP 128/60 | HR 88 | Ht 67.0 in | Wt 297.2 lb

## 2020-02-10 DIAGNOSIS — R7303 Prediabetes: Secondary | ICD-10-CM

## 2020-02-10 DIAGNOSIS — Z114 Encounter for screening for human immunodeficiency virus [HIV]: Secondary | ICD-10-CM

## 2020-02-10 DIAGNOSIS — I10 Essential (primary) hypertension: Secondary | ICD-10-CM

## 2020-02-10 DIAGNOSIS — R635 Abnormal weight gain: Secondary | ICD-10-CM

## 2020-02-10 DIAGNOSIS — Z1159 Encounter for screening for other viral diseases: Secondary | ICD-10-CM

## 2020-02-10 DIAGNOSIS — Z720 Tobacco use: Secondary | ICD-10-CM

## 2020-02-10 DIAGNOSIS — E785 Hyperlipidemia, unspecified: Secondary | ICD-10-CM

## 2020-02-10 MED ORDER — BUPROPION HCL ER (SR) 150 MG PO TB12: 150.0000 mg | ORAL_TABLET | Freq: Every day | ORAL | 0 refills | Status: DC

## 2020-02-10 MED ORDER — CARVEDILOL 25 MG PO TABS: 25.0000 mg | ORAL_TABLET | Freq: Two times a day (BID) | ORAL | 3 refills | Status: DC

## 2020-02-10 NOTE — Patient Instructions (Signed)
Thank you for coming to see me today. It was a pleasure. Today we talked about:   We will start you on Wellbutrin 150mg  once a day for one week, then increase to twice daily.  We will get some labs today.  If they are abnormal or we need to do something about them, I will call you.  If they are normal, I will send you a message on MyChart (if it is active) or a letter in the mail.  If you don't hear from in 2 weeks, please call the office at the number below.  Call the [facility] to set up an appointment for weight management. Their number is [phone].  Call 1800-QUIT-NOW for help with stopping smoking. They can assist with free resources such as patches, check-in calls, and counseling.   I will reach out to the pharmacy team about helping you quit smoking and they may call you.  Please follow-up with me in 1 month.  If you have any questions or concerns, please do not hesitate to call the office at [phone].  Best,   [phone], DO

## 2020-02-10 NOTE — Progress Notes (Signed)
SUBJECTIVE:   CHIEF COMPLAINT / HPI:   Weight gain Patient reports that she has been gaining weight over the past 8 months, thinks  somewhere around 80 pounds On chart review appears to be 15 pound increase since January This is greatly affecting her mood She reports that she does not think she has been eating very well She states that she eats multiple times throughout the day and feels that she needs an appetite suppressant Overall she states that she tries to be happy, but she is not happy with the way that she looks and feels She would like assistance with weight loss  HTN Current regimen: Coreg 25 mg twice daily, Norvasc 5 mg daily, HCTZ 25 mg daily, potassium chloride 20 mg daily Denies chest pain, shortness of breath Has slight leg edema Last BMP appears in 2014 on chart review She needs a refill on Coreg  Prediabetes Has been on Metformin 500 mg daily No prior A1c in chart Patient does not check her own CBGs  HLD Current regimen: Lipitor 20 mg daily Needs lipid panel Tolerates statin well  Tobacco abuse Patient has been trying to quit smoking She is smoking about 1 pack in 3 days She is very overwhelmed with the idea of quitting and would like assistance with this  Patient has received her Covid vaccination  PERTINENT  PMH / PSH: Hypertension, OSA, obesity, hyperlipidemia, tobacco abuse, prediabetes  OBJECTIVE:   BP 128/60   Pulse 88   Ht 5\' 7"  (1.702 m)   Wt 297 lb 3.2 oz (134.8 kg)   LMP 11/21/2012   SpO2 96%   BMI 46.55 kg/m   Physical Exam:  General: 54 y.o. female in NAD Cardio: RRR no m/r/g Lungs: CTAB, no wheezing, no rhonchi, no crackles, no IWOB on RA Skin: warm and dry Extremities: Trace BLE edema Psych: Mood and affect somewhat tearful during examination, appropriate dress, denies SI, thought process linear and logical   ASSESSMENT/PLAN:   Weight gain Weight loss does not seem to be as drastic as patient realizes however she has  gained a significant amount of weight and meets criteria for morbid obesity.  She is very motivated to lose weight.  Discussed with her that the best approach would likely be a lifestyle change.  Recommended healthy weight and wellness, which patient is very interested in.  She was given the number to call them.  We will also add on TSH to labs to ensure there is no thyroid dysfunction, although less likely, and advised patient that this would likely not be sole cause of her weight gain.  HTN (hypertension) BP well controlled today.  Continue Coreg 25 mg twice daily, Norvasc 5 mg daily, HCTZ 25 mg daily.  Will obtain BMP today.  Patient's A1c is in diabetic range, would consider adding ACE/ARB for renal protection.  Pre-diabetes A1c obtained today, could not be run until the following day due to end of day lab closure.  She has been prediabetic per her report and on Metformin 500 mg daily.  We will continue this for now.  Follow-up A1c.  Dyslipidemia Continue Lipitor.  Obtain lipid panel today.  Titrate statin pending results of lipid panel as well as diabetes diagnosis.  Tobacco abuse Discussed with patient that given she is having some depression as well as wanting to quit smoking, would recommend Wellbutrin.  She is very open to this.  She was counseled on side effects and advised to take 150 mg daily for 1  week, then increase to twice daily.  Also reach out to Dr. Raymondo Band who will have the pharmacy team check in on her to see how she is doing with her smoking cessation.  She will follow-up in 1 month.  She is also given quit line information.     Unknown Jim, DO [facility]

## 2020-02-11 DIAGNOSIS — R635 Abnormal weight gain: Secondary | ICD-10-CM | POA: Insufficient documentation

## 2020-02-11 LAB — BASIC METABOLIC PANEL
BUN/Creatinine Ratio: 19 (ref 9–23)
BUN: 14 mg/dL (ref 6–24)
CO2: 24 mmol/L (ref 20–29)
Calcium: 9.9 mg/dL (ref 8.7–10.2)
Chloride: 101 mmol/L (ref 96–106)
Creatinine, Ser: 0.73 mg/dL (ref 0.57–1.00)
GFR calc Af Amer: 106 mL/min/{1.73_m2} (ref >59)
GFR calc non Af Amer: 92 mL/min/{1.73_m2} (ref >59)
Glucose: 107 mg/dL — ABNORMAL HIGH (ref 65–99)
Potassium: 3.2 mmol/L — ABNORMAL LOW (ref 3.5–5.2)
Sodium: 143 mmol/L (ref 134–144)

## 2020-02-11 LAB — HCV AB W REFLEX TO QUANT PCR: HCV Ab: <0.1 s/co ratio (ref 0.0–0.9)

## 2020-02-11 LAB — LIPID PANEL
Chol/HDL Ratio: 4.6 ratio — ABNORMAL HIGH (ref 0.0–4.4)
Cholesterol, Total: 161 mg/dL (ref 100–199)
HDL: 35 mg/dL — ABNORMAL LOW (ref >39)
LDL Chol Calc (NIH): 90 mg/dL (ref 0–99)
Triglycerides: 212 mg/dL — ABNORMAL HIGH (ref 0–149)
VLDL Cholesterol Cal: 36 mg/dL (ref 5–40)

## 2020-02-11 LAB — POCT GLYCOSYLATED HEMOGLOBIN (HGB A1C): HbA1c, POC (controlled diabetic range): 6.6 % (ref 0.0–7.0)

## 2020-02-11 LAB — TSH: TSH: 1.51 u[IU]/mL (ref 0.450–4.500)

## 2020-02-11 LAB — HCV INTERPRETATION

## 2020-02-11 LAB — HIV ANTIBODY (ROUTINE TESTING W REFLEX): HIV Screen 4th Generation wRfx: NONREACTIVE

## 2020-02-11 NOTE — Assessment & Plan Note (Signed)
Weight loss does not seem to be as drastic as patient realizes however she has gained a significant amount of weight and meets criteria for morbid obesity.  She is very motivated to lose weight.  Discussed with her that the best approach would likely be a lifestyle change.  Recommended healthy weight and wellness, which patient is very interested in.  She was given the number to call them.  We will also add on TSH to labs to ensure there is no thyroid dysfunction, although less likely, and advised patient that this would likely not be sole cause of her weight gain.

## 2020-02-11 NOTE — Assessment & Plan Note (Signed)
Discussed with patient that given she is having some depression as well as wanting to quit smoking, would recommend Wellbutrin.  She is very open to this.  She was counseled on side effects and advised to take 150 mg daily for 1 week, then increase to twice daily.  Also reach out to Dr. Raymondo Band who will have the pharmacy team check in on her to see how she is doing with her smoking cessation.  She will follow-up in 1 month.  She is also given quit line information.

## 2020-02-11 NOTE — Assessment & Plan Note (Signed)
A1c obtained today, could not be run until the following day due to end of day lab closure.  She has been prediabetic per her report and on Metformin 500 mg daily.  We will continue this for now.  Follow-up A1c.

## 2020-02-11 NOTE — Assessment & Plan Note (Signed)
BP well controlled today.  Continue Coreg 25 mg twice daily, Norvasc 5 mg daily, HCTZ 25 mg daily.  Will obtain BMP today.  Patient's A1c is in diabetic range, would consider adding ACE/ARB for renal protection.

## 2020-02-11 NOTE — Assessment & Plan Note (Signed)
Continue Lipitor.  Obtain lipid panel today.  Titrate statin pending results of lipid panel as well as diabetes diagnosis.

## 2020-02-14 ENCOUNTER — Other Ambulatory Visit: Payer: Self-pay | Admitting: Family Medicine

## 2020-02-14 ENCOUNTER — Encounter: Payer: Self-pay | Admitting: Family Medicine

## 2020-02-14 ENCOUNTER — Telehealth: Payer: Self-pay | Admitting: Pharmacist

## 2020-02-14 DIAGNOSIS — E876 Hypokalemia: Secondary | ICD-10-CM

## 2020-02-14 DIAGNOSIS — E785 Hyperlipidemia, unspecified: Secondary | ICD-10-CM

## 2020-02-14 DIAGNOSIS — E119 Type 2 diabetes mellitus without complications: Secondary | ICD-10-CM | POA: Insufficient documentation

## 2020-02-14 MED ORDER — ATORVASTATIN CALCIUM 40 MG PO TABS: 40.0000 mg | ORAL_TABLET | Freq: Every day | ORAL | 1 refills | Status: DC

## 2020-02-14 NOTE — Telephone Encounter (Signed)
Contacted patient RE tobacco cessation follow-up/ support.   Patient had multiple questions related to bupropion SR.  I clarified the following:  - Bupropion most common side effect is insomnia  - Important to take second dose 8 hour after the first   Patient expressed willingness to start this medication for benefits including; tobacco cessation, weight loss and mood.  She plans to wait until next Sunday 9/19 to initiate the bupropion.   We agreed that  I would follow-up by phone in ~ 7-9  Days

## 2020-02-15 NOTE — Telephone Encounter (Signed)
Patient call nurse line requesting to speak with PCP about smoking cessation and the new medication prescribed. Please call her at your convenience.

## 2020-02-15 NOTE — Telephone Encounter (Signed)
Attempted to call patient.  No answer, left VM.  Advised to call back if she has questions she would like to discuss.

## 2020-02-21 ENCOUNTER — Telehealth: Payer: Self-pay | Admitting: Pharmacist

## 2020-02-21 NOTE — Telephone Encounter (Signed)
Noted and agree. 

## 2020-02-21 NOTE — Telephone Encounter (Signed)
Contacted patient RE tobacco cessation.  She initially stated "I am really stressed" and shared that her cigarette intake has increased from 5 to 10 cigs per day.  She believes most of her smoking is stress related.   She denies starting the bupropion at this time.  I encourage her to start bupropion, even if quitting cigarettes was not an option currently.  She agreed that starting bupropion may help and is considering starting.   Following some discussion, we agreed that an additional support call in 1 week would be helpful.   I will plan to follow-up in 1 week.

## 2020-02-25 ENCOUNTER — Other Ambulatory Visit: Payer: Self-pay

## 2020-02-25 ENCOUNTER — Other Ambulatory Visit: Payer: No Typology Code available for payment source

## 2020-02-25 DIAGNOSIS — E876 Hypokalemia: Secondary | ICD-10-CM

## 2020-02-26 LAB — BASIC METABOLIC PANEL
BUN/Creatinine Ratio: 15 (ref 9–23)
BUN: 11 mg/dL (ref 6–24)
CO2: 24 mmol/L (ref 20–29)
Calcium: 10.1 mg/dL (ref 8.7–10.2)
Chloride: 104 mmol/L (ref 96–106)
Creatinine, Ser: 0.75 mg/dL (ref 0.57–1.00)
GFR calc Af Amer: 103 mL/min/{1.73_m2} (ref >59)
GFR calc non Af Amer: 89 mL/min/{1.73_m2} (ref >59)
Glucose: 106 mg/dL — ABNORMAL HIGH (ref 65–99)
Potassium: 3.6 mmol/L (ref 3.5–5.2)
Sodium: 142 mmol/L (ref 134–144)

## 2020-02-28 ENCOUNTER — Telehealth: Payer: Self-pay | Admitting: Pharmacist

## 2020-02-28 ENCOUNTER — Encounter: Payer: Self-pay | Admitting: Family Medicine

## 2020-02-28 MED ORDER — NICOTINE POLACRILEX 4 MG MT LOZG: 4.0000 mg | LOZENGE | OROMUCOSAL | 2 refills | Status: DC | PRN

## 2020-02-28 NOTE — Telephone Encounter (Signed)
Contacted patient RE tobacco cessation.   Patient reports continued intake of 7-8 cigarettes per day.  She reports that she believes her current pack (purchased this AM) is her last.  She anticipates quitting smoking by Thursday (in 3 days).   We discussed initiating bupropion 150mg  SR ONCE daily in the AM (she has this medication already).  We discussed her insomnia (sleeping on 4 hours per night) and she shared that she does NOT smoke during the night.   She is willing to ADD nicotine lozenges to the bupropion on Thursday (Quit date).  New prescription provided to her pharmacy.   I will plan to follow-up in 1 week to discuss success with quit attempt.   Patient also desires a medication for sleep.  I shared that I would defer to her PCP, Dr. Monday.  - We could consider low dose nortriptyline as an option (could be used in place of bupropion or in combination.  I am happy to discuss this more.

## 2020-02-28 NOTE — Telephone Encounter (Signed)
Noted and agree. 

## 2020-02-29 NOTE — Telephone Encounter (Signed)
Discussed with Dr. Raymondo Band that nortriptyline could be used for smoking cessation as well as insomnia and could be a good option for her, but will first assess how she is doing with wellbutrin.    Called and spoke with patient who states that she is doing well and has started wellbutrin.  She plans to try this and then if insomnia is not doing well and cannot wait until next appointment, can call to schedule one sooner.  Would consider change to nortriptyline at that time.

## 2020-03-06 ENCOUNTER — Telehealth: Payer: Self-pay | Admitting: Pharmacist

## 2020-03-07 NOTE — Telephone Encounter (Signed)
Patient contacted - mid-day 10/4 - she was unable to talk at that time.  I agreed to try again later.   Patient contacted for tobacco reduction/cessation follow-up.  Patient reports reducing intake to ~ 6-7 cigarettes per day.  She has just picked up the nicotine lozenges and plans to start them later today with a goal of quitting completely (finishing all cigarettes) in the next 2-3 days.   I encouraged her to use up to 6 lozenges per day.  We agreed on a 1 week follow-up phone call.  Patient verbalized confidence in quitting.  She appears to be an excellent candidate for long-term success.

## 2020-03-07 NOTE — Telephone Encounter (Signed)
Noted and agree. 

## 2020-03-14 ENCOUNTER — Telehealth: Payer: Self-pay | Admitting: Pharmacist

## 2020-03-14 NOTE — Telephone Encounter (Signed)
Attempted phone follow-up for tobacco cessation.    Patient had scheduled quit date within the past week.    I left a voice mail message requesting call back.  If I do not hear from patient I plan to try again in 2-3 days.

## 2020-03-16 ENCOUNTER — Other Ambulatory Visit: Payer: Self-pay

## 2020-03-16 ENCOUNTER — Ambulatory Visit (INDEPENDENT_AMBULATORY_CARE_PROVIDER_SITE_OTHER): Payer: Self-pay | Admitting: Obstetrics and Gynecology

## 2020-03-16 ENCOUNTER — Telehealth: Payer: Self-pay | Admitting: Pharmacist

## 2020-03-16 ENCOUNTER — Encounter: Payer: Self-pay | Admitting: Obstetrics and Gynecology

## 2020-03-16 VITALS — BP 141/89 | HR 81 | Wt 298.7 lb

## 2020-03-16 DIAGNOSIS — R32 Unspecified urinary incontinence: Secondary | ICD-10-CM

## 2020-03-16 DIAGNOSIS — I10 Essential (primary) hypertension: Secondary | ICD-10-CM

## 2020-03-16 DIAGNOSIS — N898 Other specified noninflammatory disorders of vagina: Secondary | ICD-10-CM | POA: Insufficient documentation

## 2020-03-16 MED ORDER — POTASSIUM CHLORIDE CRYS ER 20 MEQ PO TBCR: 20.0000 meq | EXTENDED_RELEASE_TABLET | Freq: Two times a day (BID) | ORAL | 1 refills | Status: DC

## 2020-03-16 NOTE — Progress Notes (Signed)
Obstetrics and Gynecology New Patient Evaluation  Appointment Date: 03/16/2020  OBGYN Clinic: [facility]  Primary Care Provider: Unknown Jim  Referring Provider: Unknown Jim, *  Chief Complaint:  Chief Complaint  Patient presents with  . Vaginal Odor    History of Present Illness: Virginia Levine is a 54 y.o.  G2P0020 (Patient's last menstrual period was 11/21/2012.), seen for the above chief complaint.   Patient previously at [facility] (see care everywhere) and followed for her abnormal paps and vaginal odor previously   Patient states s/s began after her LEEP in 2018 and she has recurrent vaginal odor. She has been on recurrent metrogel in the past and she states it did not help. She has no vaginal itching. She endorses occasional tan discharge. She does state that she has a panty liner daily due to some post void dribbling. She doesn't have any SUI or increased urinary frequency. She states that after she voids she does have some urine that comes out.   She does not douche or use anything OTC and she uses dove soap.   Review of Systems: Pertinent items noted in HPI and remainder of comprehensive ROS otherwise negative.    Patient Active Problem List   Diagnosis Date Noted  . Vaginal odor 03/16/2020  . Urinary incontinence 03/16/2020  . Controlled type 2 diabetes mellitus without complication, without long-term current use of insulin (HCC) 02/14/2020  . Weight gain 02/11/2020  . OSA (obstructive sleep apnea) 12/15/2019  . Screen for colon cancer 12/15/2019  . Obesity 01/21/2013  . Dyslipidemia 01/21/2013  . Tobacco abuse 01/21/2013  . HTN (hypertension) 12/11/2012    Past Medical History:  Past Medical History:  Diagnosis Date  . Hyperlipidemia   . Hypertension   . Obesity   . OSA (obstructive sleep apnea)   . Pre-diabetes   . Tobacco use     Past Surgical History:  Past Surgical History:  Procedure Laterality  Date  . LEEP  2018    Past Obstetrical History:  OB History  Gravida Para Term Preterm AB Living  2 0 0 0 2 0  SAB TAB Ectopic Multiple Live Births  2 0          # Outcome Date GA Lbr Len/2nd Weight Sex Delivery Anes PTL Lv  2 SAB           1 SAB             Past Gynecological History: As per HPI. LMP: late 30s History of Pap Smear(s): Yes.   Last pap ASCUS, HPV neg (02/2020) History of HRT use: Yes.    Social History:  Social History   Socioeconomic History  . Marital status: Single    Spouse name: Not on file  . Number of children: 0  . Years of education: Not on file  . Highest education level: Some college, no degree  Occupational History  . Not on file  Tobacco Use  . Smoking status: Light Tobacco Smoker    Types: Cigarettes  . Smokeless tobacco: Never Used  . Tobacco comment: smokes with drinking more (once a month), 5-6 cigarettes a day  Vaping Use  . Vaping Use: Never used  Substance and Sexual Activity  . Alcohol use: Yes    Comment: occasional  . Drug use: No  . Sexual activity: Never  Other Topics Concern  . Not on file  Social History Narrative  . Not on file   Social Determinants of  Health   Financial Resource Strain:   . Difficulty of Paying Living Expenses: Not on file  Food Insecurity:   . Worried About Programme researcher, broadcasting/film/video in the Last Year: Not on file  . Ran Out of Food in the Last Year: Not on file  Transportation Needs: No Transportation Needs  . Lack of Transportation (Medical): No  . Lack of Transportation (Non-Medical): No  Physical Activity:   . Days of Exercise per Week: Not on file  . Minutes of Exercise per Session: Not on file  Stress:   . Feeling of Stress : Not on file  Social Connections:   . Frequency of Communication with Friends and Family: Not on file  . Frequency of Social Gatherings with Friends and Family: Not on file  . Attends Religious Services: Not on file  . Active Member of Clubs or Organizations: Not on  file  . Attends Banker Meetings: Not on file  . Marital Status: Not on file  Intimate Partner Violence:   . Fear of Current or Ex-Partner: Not on file  . Emotionally Abused: Not on file  . Physically Abused: Not on file  . Sexually Abused: Not on file    Family History:  Family History  Problem Relation Age of Onset  . Heart attack Father        died at age 19  . Leukemia Mother 19  . Diabetes Mother   . Drug abuse Sister   . Aneurysm Sister   . Kidney failure Brother   . Hypertension Brother   . Aneurysm Sister   . Diabetes Maternal Grandmother     Health Maintenance:  Mammogram(s): ordered by [facility]  Medications Oletta Buehring had no medications administered during this visit. Current Outpatient Medications  Medication Sig Dispense Refill  . amLODipine (NORVASC) 5 MG tablet Take 1 tablet (5 mg total) by mouth daily. 90 tablet 0  . atorvastatin (LIPITOR) 40 MG tablet Take 1 tablet (40 mg total) by mouth daily. 90 tablet 1  . buPROPion (WELLBUTRIN SR) 150 MG 12 hr tablet Take 1 tablet (150 mg total) by mouth daily. Take once daily for 1 week, then increase to 2 times a day (Patient not taking: Reported on 02/14/2020) 53 tablet 0  . carvedilol (COREG) 25 MG tablet Take 1 tablet (25 mg total) by mouth 2 (two) times daily with a meal. 180 tablet 3  . hydrochlorothiazide (HYDRODIURIL) 25 MG tablet Take 1 tablet (25 mg total) by mouth daily. 90 tablet 0  . metFORMIN (GLUCOPHAGE-XR) 500 MG 24 hr tablet Take 1 tablet (500 mg total) by mouth daily with breakfast. 180 tablet 3  . nicotine polacrilex (COMMIT) 4 MG lozenge Take 1 lozenge (4 mg total) by mouth as needed for smoking cessation. (Patient not taking: Reported on 03/07/2020) 100 tablet 2  . potassium chloride SA (KLOR-CON) 20 MEQ tablet Take 1 tablet (20 mEq total) by mouth 2 (two) times daily. 60 tablet 1   No current facility-administered medications for this visit.    Allergies Lisinopril   Physical Exam:   BP (!) 141/89   Pulse 81   Wt 298 lb 11.2 oz (135.5 kg)   LMP 11/21/2012   BMI 46.78 kg/m  Body mass index is 46.78 kg/m.  General appearance: Well nourished, well developed female in no acute distress.  Cardiovascular: normal s1 and s2.  No murmurs, rubs or gallops. Respiratory:  Clear to auscultation bilateral. Normal respiratory effort Abdomen: positive bowel sounds and no  masses, hernias; diffusely non tender to palpation, non distended Neuro/Psych:  Normal mood and affect.  Skin:  Warm and dry.  Lymphatic:  No inguinal lymphadenopathy.   Pelvic exam: is not limited by body habitus EGBUS: within normal limits Vagina: within normal limits and with no blood or discharge in the vault Cervix: normal appearing cervix without tenderness, discharge or lesions. Uterus:  nonenlarged and non tender Adnexa:  normal adnexa and no mass, fullness, tenderness Rectovaginal: deferred  Laboratory: as per HPI  Radiology: none  Assessment: pt stable  Plan:  1. Vaginal odor I didn't smell a vaginal odor but I did smell what smelled to be old urine so I told her if the smell is actually external due to the urinary incontinence and vaginal per se. Follow up swab and I gave her some strategies for hygiene like keeping her bladder empty, waiting after she voids to make sure all of her urine comes out, etc.  Even if swab is negative, she will likely benefit from pelvic PT, vaginal estrogen and/or urogyn consult for urodynamics. - NuSwab Vaginitis Plus (VG+)  2. Urinary incontinence, unspecified type Pelvic floor pt  Orders Placed This Encounter  Procedures  . NuSwab Vaginitis Plus (VG+)    RTC after swab results.   Cornelia Copa MD Attending [facility] [employer] [facility])

## 2020-03-16 NOTE — Telephone Encounter (Signed)
Attempted to contact RE tobacco cessation.   No answer.  Left message and return phone call request.  Provided direct phone line.   As I will be out of the office, I will try her again in 7 days if she fails to call back today.

## 2020-03-19 LAB — NUSWAB VAGINITIS PLUS (VG+)
Candida albicans, NAA: NEGATIVE
Candida glabrata, NAA: NEGATIVE
Chlamydia trachomatis, NAA: NEGATIVE
Neisseria gonorrhoeae, NAA: NEGATIVE
Trich vag by NAA: NEGATIVE

## 2020-03-23 ENCOUNTER — Ambulatory Visit (INDEPENDENT_AMBULATORY_CARE_PROVIDER_SITE_OTHER): Payer: Self-pay | Admitting: Family Medicine

## 2020-03-23 ENCOUNTER — Encounter: Payer: Self-pay | Admitting: Family Medicine

## 2020-03-23 ENCOUNTER — Other Ambulatory Visit (HOSPITAL_COMMUNITY): Payer: Self-pay | Admitting: Family Medicine

## 2020-03-23 ENCOUNTER — Telehealth: Payer: Self-pay | Admitting: Pharmacist

## 2020-03-23 ENCOUNTER — Other Ambulatory Visit: Payer: Self-pay

## 2020-03-23 VITALS — BP 132/84 | HR 83 | Ht 67.0 in | Wt 302.0 lb

## 2020-03-23 DIAGNOSIS — Z720 Tobacco use: Secondary | ICD-10-CM

## 2020-03-23 DIAGNOSIS — E876 Hypokalemia: Secondary | ICD-10-CM | POA: Insufficient documentation

## 2020-03-23 DIAGNOSIS — A498 Other bacterial infections of unspecified site: Secondary | ICD-10-CM | POA: Insufficient documentation

## 2020-03-23 DIAGNOSIS — E119 Type 2 diabetes mellitus without complications: Secondary | ICD-10-CM

## 2020-03-23 MED ORDER — OFLOXACIN 0.3 % OP SOLN: OPHTHALMIC | 0 refills | Status: DC

## 2020-03-23 MED FILL — NICOTINE 2 MG LOZENGE: 2 | 10 days supply | Qty: 72 | Fill #0

## 2020-03-23 NOTE — Assessment & Plan Note (Signed)
Precepted with Dr. Sheffield Slider.  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  She does not seem to have systemic symptoms and does not seem to have cellulitis of the toes bilaterally.  Treatment for this would be topical fluoroquinolone, called and spoke with the clinical pharmacist noted that the cheapest option would be to use eye or ear drops.  We will send in ofloxacin to use twice daily on each toe and also do antiseptic baths with 1 part bleach to 4 parts water at least twice daily.  She will follow up closely in 2 weeks to ensure that she has not had worsening of her symptoms.

## 2020-03-23 NOTE — Assessment & Plan Note (Signed)
Discussed with patient new diagnosis of diabetes.  Also discussed weight loss techniques including not eating after 7 PM and walking after she eats.  Her A1c is well controlled at this time at 6.6.  We will continue her on Metformin 500 mg daily.  Could increase this if needed.  She was recently increased to Lipitor 40 mg, not yet time to recheck LDL.  Plan to recheck 3 months after start.  Would not start her on ACE/ARB due to angioedema with lisinopril.

## 2020-03-23 NOTE — Assessment & Plan Note (Signed)
Congratulated patient on the changes that she has made towards her health and decreasing smoking.  We will continue with her current management.  She states that she will decide if she wants to do Wellbutrin in the future.  We will continue to support her through this.

## 2020-03-23 NOTE — Patient Instructions (Signed)
Thank you for coming to see me today. It was a pleasure. Today we talked about:   We will get some labs today.  If they are abnormal or we need to do something about them, I will call you.  If they are normal, I will send you a message on MyChart (if it is active) or a letter in the mail.  If you don't hear from [place] in 2 weeks, please call the office at the number below.  We will send eye drops for you to use for your toenails twice a day.  Do bleach soaks (1 part bleach to 4 parts water) 2-3 times a day.  Keep your toes aired out and dry when you are able.  Make sure your nails are dry after your soaks.  Please follow-up with me in 2 weeks to make sure your nails are okay.  If you have any questions or concerns, please do not hesitate to call the office at [phone].  Best,   Luis Abed, DO

## 2020-03-23 NOTE — Assessment & Plan Note (Signed)
We will recheck her BMP today to see if this is resolved.

## 2020-03-23 NOTE — Telephone Encounter (Signed)
Sent Nicotine Gum Lozenge 2 mg PRN prescription to [facility] via fax. Pharmacy stated it will be 30-45 mins to process and prepare. Provided patient with pharmacy address and phone number. Patient states she will be able to pick up prescription after visit with Dr. [name] today. Provided patient with Dr. Macky Lower direct office line if she experiences an issues. Patient was grateful and verbalized understanding.  Fabio Neighbors, PharmD PGY2 Ambulatory Care Resident [facility]  Pharmacy

## 2020-03-23 NOTE — Progress Notes (Signed)
    SUBJECTIVE:   CHIEF COMPLAINT / HPI:   New diagnosis of diabetes Patient had a previous diagnosis of prediabetes A1c performed on 9/9 was in diabetic range, 6.6 She is currently on Lipitor 40 mg, but now has an LDL goal of less than 70, was 90 on recheck and was reason for increased to 40 mg from 20 mg She is ineligible for ACE/ARB therapy as she had angioedema with lisinopril She is already on Metformin 500 mg daily  Hypokalemia BMP on 9/9 with potassium of 3.2 Patient takes potassium and was advised to increase this in the short-term She took the potassium as advised  Tobacco abuse with depression She has been working with Dr. Raymondo Band She never started wellbutrin She reports that she is now smoking only every so often, if she does smoke in a day at all, she will smoke only 3 times at maximum She is going to start lozenges per Dr. Macky Lower recommendation and receive them today  Green toenails First noticed the beginning of August Both great toenails Otherwise feeling well No redness of the toes   PERTINENT  PMH / PSH: HTN, OSA, obesity, HLD, tobacco abuse, T2DM  OBJECTIVE:   BP 132/84   Pulse 83   Ht 5\' 7"  (1.702 m)   Wt (!) 302 lb (137 kg)   LMP 11/21/2012   SpO2 98%   BMI 47.30 kg/m    Physical Exam:  General: 54 y.o. female in NAD Lungs: Breathing comfortably on room air Skin: warm and dry Extremities: Bilateral toenails with increased thickening and greenish hue, no surrounding erythema, 1+ dorsalis pedis pulses bilaterally, monofilament sensation intact, see images below        ASSESSMENT/PLAN:   Controlled type 2 diabetes mellitus without complication, without long-term current use of insulin (HCC) Discussed with patient new diagnosis of diabetes.  Also discussed weight loss techniques including not eating after 7 PM and walking after she eats.  Her A1c is well controlled at this time at 6.6.  We will continue her on Metformin 500 mg daily.  Could  increase this if needed.  She was recently increased to Lipitor 40 mg, not yet time to recheck LDL.  Plan to recheck 3 months after start.  Would not start her on ACE/ARB due to angioedema with lisinopril.  Hypokalemia We will recheck her BMP today to see if this is resolved.  Tobacco abuse Congratulated patient on the changes that she has made towards her health and decreasing smoking.  We will continue with her current management.  She states that she will decide if she wants to do Wellbutrin in the future.  We will continue to support her through this.  Pseudomonas infection Precepted with Dr. 59.  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  She does not seem to have systemic symptoms and does not seem to have cellulitis of the toes bilaterally.  Treatment for this would be topical fluoroquinolone, called and spoke with the clinical pharmacist noted that the cheapest option would be to use eye or ear drops.  We will send in ofloxacin to use twice daily on each toe and also do antiseptic baths with 1 part bleach to 4 parts water at least twice daily.  She will follow up closely in 2 weeks to ensure that she has not had worsening of her symptoms.     Sheffield Slider, DO [facility]

## 2020-03-27 LAB — BASIC METABOLIC PANEL

## 2020-03-30 NOTE — Telephone Encounter (Signed)
Spoke with Virginia Levine regarding her smoking cessation progress. She picked up her nicotine lozenges this past Monday (10/25) and was under the impression that she couldn't start the lozenges until after she quits smoking. Discussed that it is okay to start using lozenges now while smoking as they will help reduce cravings, and ultimately reduce the number of cigarettes. Patient verbalized understanding and plans to start using lozenges tomorrow. Patient is currently smoking 2-3 cigs per day and is experiencing personal and financial stress.   We set a goal of 1 cig/day in the next 2-3 weeks.   Patient states she will call Dr. Raymondo Band on Tuesday 11/2 and I will follow-up with her in 2 weeks.

## 2020-04-03 ENCOUNTER — Telehealth: Payer: Self-pay

## 2020-04-03 ENCOUNTER — Other Ambulatory Visit: Payer: Self-pay | Admitting: Family Medicine

## 2020-04-03 DIAGNOSIS — A498 Other bacterial infections of unspecified site: Secondary | ICD-10-CM

## 2020-04-03 MED ORDER — OFLOXACIN 0.3 % OP SOLN: OPHTHALMIC | 0 refills | Status: DC

## 2020-04-03 NOTE — Telephone Encounter (Signed)
Called and spoke with patient in regards to her toenail.  Advised her that the coloring of her toenail would not change, it takes 12 weeks to regrow toenail, therefore would not expect to see any improvement in the coloration of her toenail.  She reports that she is not having any redness around the toe itself, which is reassuring.  She is requesting a refill on the drops for her toenail, refill was provided.  She has a follow-up appointment on 11/11.  She reports that she has some pain in the top of her foot and into her other toes, but not specifically in her great toe.  Because of this, the pain is most likely not from the infection of her toenail.  Can follow-up about this on 11/11.

## 2020-04-03 NOTE — Telephone Encounter (Signed)
Patient calls nurse line requesting a different antibiotic for toenail. Patient reports she has been compliant with original prescription, however she feels it is not helping at all. Patient reports pain to her toenail and continued discoloration. Patient has a FU apt scheduled for 11/11 with PCP. Please advise on switching to a different antibiotic.

## 2020-04-12 ENCOUNTER — Other Ambulatory Visit: Payer: Self-pay | Admitting: Family Medicine

## 2020-04-13 ENCOUNTER — Encounter: Payer: Self-pay | Admitting: Family Medicine

## 2020-04-13 ENCOUNTER — Other Ambulatory Visit: Payer: Self-pay

## 2020-04-13 ENCOUNTER — Ambulatory Visit (INDEPENDENT_AMBULATORY_CARE_PROVIDER_SITE_OTHER): Payer: Self-pay | Admitting: Family Medicine

## 2020-04-13 ENCOUNTER — Telehealth: Payer: Self-pay | Admitting: Pharmacist

## 2020-04-13 ENCOUNTER — Other Ambulatory Visit (HOSPITAL_COMMUNITY): Payer: Self-pay | Admitting: Family Medicine

## 2020-04-13 VITALS — BP 136/84 | HR 90 | Ht 67.0 in | Wt 307.0 lb

## 2020-04-13 DIAGNOSIS — A498 Other bacterial infections of unspecified site: Secondary | ICD-10-CM

## 2020-04-13 DIAGNOSIS — M2141 Flat foot [pes planus] (acquired), right foot: Secondary | ICD-10-CM

## 2020-04-13 DIAGNOSIS — G4700 Insomnia, unspecified: Secondary | ICD-10-CM | POA: Insufficient documentation

## 2020-04-13 DIAGNOSIS — M2142 Flat foot [pes planus] (acquired), left foot: Secondary | ICD-10-CM

## 2020-04-13 DIAGNOSIS — R7303 Prediabetes: Secondary | ICD-10-CM

## 2020-04-13 DIAGNOSIS — Z720 Tobacco use: Secondary | ICD-10-CM

## 2020-04-13 DIAGNOSIS — M2140 Flat foot [pes planus] (acquired), unspecified foot: Secondary | ICD-10-CM | POA: Insufficient documentation

## 2020-04-13 MED ORDER — NORTRIPTYLINE HCL 25 MG PO CAPS: 25.0000 mg | ORAL_CAPSULE | Freq: Every day | ORAL | 1 refills | Status: DC

## 2020-04-13 MED FILL — NICOTINE 4 MG LOZENGE: 4 | 10 days supply | Qty: 72 | Fill #0

## 2020-04-13 NOTE — Progress Notes (Signed)
SUBJECTIVE:   CHIEF COMPLAINT / HPI:   F/U Pseudomonas infection of bilateral great toenails Patient was diagnosed with green nail syndrome secondary to pseudomonal infection of her bilateral great toenails on 10/21 She was started on ofloxacin drops to use on each toe and advised to use antiseptic baths at least twice daily She reports that she is also having pain in the top of her foot and in her third toe occasionally after walking No fevers No redness of her toes She is wondering if she should have her toenails removed at this time  Tobacco abuse Patient spoke with pharmacist this morning and advised that lozenges does not seem to be working, she has been on 2 mg dose and thinks that increasing to 4 mg dose would be better Currently smoking 2 cigarettes/day She is currently deciding on a quit date  Prediabetes versus T2DM Last A1c 6.6, prior to that she had not had any abnormal A1c is in diabetic range or elevated CBGs, therefore has diagnosis of prediabetes, does not yet meet diagnosis for T2DM We will continue to have an LDL goal of less than 70 Current regimen: Metformin 500 mg daily, Lipitor 40 mg daily She was previously increased from 20 mg to 40 mg of Lipitor in September  Insomnia Patient continues to have symptoms of insomnia difficulty sleeping and staying asleep She is looking for a medication that can help with this She has tried melatonin without any improvement   PERTINENT  PMH / PSH: T2DM, HTN, OSA, obesity, HLD, tobacco abuse, prediabetes  OBJECTIVE:   BP 136/84    Pulse 90    Ht 5\' 7"  (1.702 m)    Wt (!) 307 lb (139.3 kg)    LMP 11/21/2012    SpO2 97%    BMI 48.08 kg/m    Physical Exam:  General: 54 y.o. female in NAD Lungs: Breathing comfortably on room air Skin: warm and dry Extremities: Pes planus bilaterally, second toe overrides third toe on left foot.  Collapse of transverse arch noted.  Bilateral great toenails with darkening and slight  greenish hue, splitting of the left great toenail with some separation from underlying matrix, whitish thickening and changes of other nails.  New nail growth appears thickened, but not discolored at base of bilateral great toenails.   ASSESSMENT/PLAN:   Pseudomonas infection Patient does have a slightly greenish hue to the tips of her toenails, could also consider onychomycosis as a cause.  Discussed with her that her pain is likely not secondary to this is most likely secondary to pes planus and changes to the anatomy of her feet.  We will have her go ahead and continue treatment for Pseudomonas green nail, which can be 1 to 4 months of topical for quinolones.  We will have her continue antiseptic treatment as well.  If this does not improve, could consider treatment of onychomycosis if she desires.  Also discussed podiatry referral, which she may do when she gets insurance.  Advised against removing the toenails at this time as this would not improve her pain and would only be for cosmetic reasons, would also likely cause her more pain.  She voiced understanding and agreed to proceed with current treatment.  She will follow-up in 1 month.  Flat foot Patient has pes planus bilaterally and collapse of transverse arch with third toe turning underneath of second toe on the left.  This is most likely the cause of her foot pain.  Given that she  does not have insurance at this time would start with over-the-counter arch supports.  Advised that she can obtain from [employer] or [employer].  Could also consider referral to sports medicine in the future for custom orthotics.  Tobacco abuse Congratulated patient on her success.  She will pick up her prescription for 4 mg lozenges at the outpatient pharmacy as these were sent today by the pharmacy team.  She will work on picking a quit date.  Pre-diabetes Patient does not technically meet criteria for diagnosis of diabetes.  Updated in chart.  She still meets  prediabetic range given that she only has one abnormal A1c in range.  No change her current regimen.  Insomnia Given her insomnia, discussed with Dr. Raymondo Band, will go ahead and start her on nortriptyline as this can be helpful for insomnia as well depression and smoking cessation.  We will start her at 25 mg nightly and titrate up as needed.  She will follow-up in 1 month.     Unknown Jim, DO [facility]

## 2020-04-13 NOTE — Assessment & Plan Note (Signed)
Congratulated patient on her success.  She will pick up her prescription for 4 mg lozenges at the outpatient pharmacy as these were sent today by the pharmacy team.  She will work on picking a quit date.

## 2020-04-13 NOTE — Telephone Encounter (Signed)
Contacted patient regarding smoking cessation progress. Patient reports smoking 2 cigs/day. Reports using lozenges but does not feel they are working. She reports using the 2 mg dose and states she may need the higher 4 mg dose. Patient states she plans to discuss this with Dr. [name] today at her appointment at 4:10PM.   Plan to call patient in 2-3 weeks.

## 2020-04-13 NOTE — Assessment & Plan Note (Signed)
Patient has pes planus bilaterally and collapse of transverse arch with third toe turning underneath of second toe on the left.  This is most likely the cause of her foot pain.  Given that she does not have insurance at this time would start with over-the-counter arch supports.  Advised that she can obtain from [employer] or [employer].  Could also consider referral to sports medicine in the future for custom orthotics.

## 2020-04-13 NOTE — Assessment & Plan Note (Signed)
Given her insomnia, discussed with Dr. Raymondo Band, will go ahead and start her on nortriptyline as this can be helpful for insomnia as well depression and smoking cessation.  We will start her at 25 mg nightly and titrate up as needed.  She will follow-up in 1 month.

## 2020-04-13 NOTE — Patient Instructions (Signed)
Thank you for coming to see me today. It was a pleasure. Today we talked about:   We will keep doing the soaks and antibiotic drops on your toes.  Get the insoles we talked about for arch support.  We will start nortriptyline at 25 mg nightly for insomnia.  We can titrate this up if needed.  Please follow-up with me in 1 month.  If you have any questions or concerns, please do not hesitate to call the office at [phone].  Best,   Luis Abed, DO

## 2020-04-13 NOTE — Telephone Encounter (Addendum)
Faxed prescription of Nicotine lozenge 4 mg PRN cravings to [facility]. Dr. [name] will inform patient lozenge will be ready at for pick up.

## 2020-04-13 NOTE — Assessment & Plan Note (Signed)
Patient does have a slightly greenish hue to the tips of her toenails, could also consider onychomycosis as a cause.  Discussed with her that her pain is likely not secondary to this is most likely secondary to pes planus and changes to the anatomy of her feet.  We will have her go ahead and continue treatment for Pseudomonas green nail, which can be 1 to 4 months of topical for quinolones.  We will have her continue antiseptic treatment as well.  If this does not improve, could consider treatment of onychomycosis if she desires.  Also discussed podiatry referral, which she may do when she gets insurance.  Advised against removing the toenails at this time as this would not improve her pain and would only be for cosmetic reasons, would also likely cause her more pain.  She voiced understanding and agreed to proceed with current treatment.  She will follow-up in 1 month.

## 2020-04-13 NOTE — Assessment & Plan Note (Signed)
Patient does not technically meet criteria for diagnosis of diabetes.  Updated in chart.  She still meets prediabetic range given that she only has one abnormal A1c in range.  No change her current regimen.

## 2020-04-20 ENCOUNTER — Telehealth: Payer: Self-pay | Admitting: Pharmacist

## 2020-04-20 DIAGNOSIS — Z720 Tobacco use: Secondary | ICD-10-CM

## 2020-04-20 NOTE — Telephone Encounter (Signed)
Contacted patient regarding smoking cessation progress. Patient confirmed she picked up new prescription of nicotine lozenges 4 mg with no issues from the pharmacy. Reports she is now smoking 3-4 cigs/week (originally 2 cigs/day). Congratulated her on this achievement. Patient is not ready to set a quit date but is hopeful she will be smoke-free in two weeks. Will follow-up with her in 2 weeks to assess progress.

## 2020-04-24 ENCOUNTER — Other Ambulatory Visit: Payer: Self-pay

## 2020-04-24 DIAGNOSIS — I10 Essential (primary) hypertension: Secondary | ICD-10-CM

## 2020-04-24 DIAGNOSIS — A498 Other bacterial infections of unspecified site: Secondary | ICD-10-CM

## 2020-04-24 MED ORDER — OFLOXACIN 0.3 % OP SOLN: OPHTHALMIC | 0 refills | Status: DC

## 2020-04-24 MED ORDER — AMLODIPINE BESYLATE 5 MG PO TABS: 5.0000 mg | ORAL_TABLET | Freq: Every day | ORAL | 2 refills | Status: DC

## 2020-04-25 ENCOUNTER — Telehealth: Payer: Self-pay

## 2020-04-25 ENCOUNTER — Other Ambulatory Visit: Payer: Self-pay | Admitting: Family Medicine

## 2020-04-25 DIAGNOSIS — B351 Tinea unguium: Secondary | ICD-10-CM

## 2020-04-25 DIAGNOSIS — A498 Other bacterial infections of unspecified site: Secondary | ICD-10-CM

## 2020-04-25 NOTE — Telephone Encounter (Signed)
Pt informed. Gave pt number to [facility]. Sunday Spillers, CMA

## 2020-04-25 NOTE — Progress Notes (Signed)
Patient requesting podiatry referral.  Been treating for green nail, but also likely underlying onychomycosis.

## 2020-04-25 NOTE — Telephone Encounter (Signed)
Patient calls nurse line requesting a referral to Podiatry. Patient reports she has been having some issues with her feet that are not resolving. Spoke with Jazmin as patient is self pay. A referral can be placed, however the patient will have to set up a payment plan with office. The patient has been informed of this.

## 2020-04-25 NOTE — Telephone Encounter (Signed)
Referral placed.  Please make patient aware °

## 2020-05-12 ENCOUNTER — Telehealth: Payer: Self-pay | Admitting: Family Medicine

## 2020-05-12 NOTE — Telephone Encounter (Signed)
Patient is calling and would like for Dr. [name] to call her to discuss issues with her foot. She had to cancel her appointment for 12/13 and was not able to reschedule.   The best call back is [phone]

## 2020-05-15 ENCOUNTER — Ambulatory Visit: Payer: No Typology Code available for payment source | Admitting: Family Medicine

## 2020-05-16 NOTE — Telephone Encounter (Signed)
Returned patient's call.  She just wanted to state that she had quit smoking and she cancelled her appointment because she wanted to see the podiatrist before she came back.  Congratulated on her success and advised that her follow up plan is fine.  She has an appointment on 1/6 with podiatry and is otherwise doing well.  Will forward to Dr. Raymondo Band as well to advise him that patient quit smoking and his team has been working with her on this.

## 2020-05-30 ENCOUNTER — Encounter (HOSPITAL_COMMUNITY): Payer: Self-pay

## 2020-05-30 ENCOUNTER — Emergency Department (HOSPITAL_COMMUNITY)
Admission: EM | Admit: 2020-05-30 | Discharge: 2020-05-30 | Disposition: A | Discharge status: Home / Self Care | Payer: No Typology Code available for payment source | Attending: Emergency Medicine | Admitting: Emergency Medicine

## 2020-05-30 ENCOUNTER — Other Ambulatory Visit: Payer: Self-pay

## 2020-05-30 DIAGNOSIS — J029 Acute pharyngitis, unspecified: Secondary | ICD-10-CM | POA: Insufficient documentation

## 2020-05-30 DIAGNOSIS — Z7722 Contact with and (suspected) exposure to environmental tobacco smoke (acute) (chronic): Secondary | ICD-10-CM | POA: Insufficient documentation

## 2020-05-30 DIAGNOSIS — Z79899 Other long term (current) drug therapy: Secondary | ICD-10-CM | POA: Insufficient documentation

## 2020-05-30 DIAGNOSIS — Z20822 Contact with and (suspected) exposure to covid-19: Secondary | ICD-10-CM | POA: Insufficient documentation

## 2020-05-30 DIAGNOSIS — R059 Cough, unspecified: Secondary | ICD-10-CM

## 2020-05-30 DIAGNOSIS — Z7984 Long term (current) use of oral hypoglycemic drugs: Secondary | ICD-10-CM | POA: Insufficient documentation

## 2020-05-30 DIAGNOSIS — M791 Myalgia, unspecified site: Secondary | ICD-10-CM | POA: Insufficient documentation

## 2020-05-30 LAB — RESP PANEL BY RT-PCR (RSV, FLU A&B, COVID)  RVPGX2
Influenza A by PCR: NEGATIVE
Influenza B by PCR: NEGATIVE
Resp Syncytial Virus by PCR: NEGATIVE
SARS Coronavirus 2 by RT PCR: NEGATIVE

## 2020-05-30 MED ORDER — BENZONATATE 100 MG PO CAPS: 100.0000 mg | ORAL_CAPSULE | Freq: Three times a day (TID) | ORAL | 0 refills | Status: DC

## 2020-05-30 MED ORDER — LIDOCAINE VISCOUS HCL 2 % MT SOLN: 15.0000 mL | OROMUCOSAL | 0 refills | Status: DC | PRN

## 2020-05-30 NOTE — ED Triage Notes (Signed)
Patient c/o sore throat and body aches x 2 days.

## 2020-05-30 NOTE — ED Provider Notes (Signed)
[facility]-EMERGENCY DEPT Provider Note   CSN: [id number] Arrival date & time: 05/30/20  1156     History Chief Complaint  Patient presents with  . Sore Throat  . Generalized Body Aches    Virginia Levine is a 54 y.o. female with a past medical history significant for hyperlipidemia, hypertension, OSA, and prediabetes who presents to the ED due to sore throat and myalgias x2 days. Denies difficulties swallowing, changes to phonation, and neck pain. Patient denies sick contacts or known Covid exposures.  She has received both her Covid vaccines.  Denies chest pain and shortness of breath.  Admits to a dry cough.  Denies fever, chills, abdominal pain, nausea, vomiting, diarrhea.  No treatment prior to arrival.  No aggravating or alleviating factors  History obtained from patient and past medical records. No interpreter used during encounter.      Past Medical History:  Diagnosis Date  . Hyperlipidemia   . Hypertension   . Obesity   . OSA (obstructive sleep apnea)   . Pre-diabetes   . Tobacco use     Patient Active Problem List   Diagnosis Date Noted  . Insomnia 04/13/2020  . Flat foot 04/13/2020  . Pseudomonas infection 03/23/2020  . Hypokalemia 03/23/2020  . Vaginal odor 03/16/2020  . Urinary incontinence 03/16/2020  . Weight gain 02/11/2020  . Pre-diabetes 12/15/2019  . OSA (obstructive sleep apnea) 12/15/2019  . Screen for colon cancer 12/15/2019  . Obesity 01/21/2013  . Dyslipidemia 01/21/2013  . Tobacco abuse 01/21/2013  . HTN (hypertension) 12/11/2012    Past Surgical History:  Procedure Laterality Date  . LEEP  2018     OB History    Gravida  2   Para  0   Term  0   Preterm  0   AB  2   Living  0     SAB  2   IAB  0   Ectopic      Multiple      Live Births              Family History  Problem Relation Age of Onset  . Heart attack Father        died at age 31  . Leukemia Mother 16  . Diabetes Mother   .  Drug abuse Sister   . Aneurysm Sister   . Kidney failure Brother   . Hypertension Brother   . Aneurysm Sister   . Diabetes Maternal Grandmother     Social History   Tobacco Use  . Smoking status: Light Tobacco Smoker    Packs/day: 0.15    Types: Cigarettes  . Smokeless tobacco: Never Used  . Tobacco comment: smokes with drinking more (once a month), 5-6 cigarettes a day  Vaping Use  . Vaping Use: Never used  Substance Use Topics  . Alcohol use: Yes    Comment: occasional  . Drug use: No    Home Medications Prior to Admission medications   Medication Sig Start Date End Date Taking? Authorizing Provider  benzonatate (TESSALON) 100 MG capsule Take 1 capsule (100 mg total) by mouth every 8 (eight) hours. 05/30/20  Yes Nayelis Bonito C, PA-C  lidocaine (XYLOCAINE) 2 % solution Use as directed 15 mLs in the mouth or throat as needed for mouth pain. 05/30/20  Yes Jamesia Linnen C, PA-C  amLODipine (NORVASC) 5 MG tablet Take 1 tablet (5 mg total) by mouth daily. 04/24/20   Meccariello, Solmon Ice, DO  atorvastatin (  LIPITOR) 40 MG tablet Take 1 tablet (40 mg total) by mouth daily. 02/14/20   Meccariello, Solmon Ice, DO  carvedilol (COREG) 25 MG tablet Take 1 tablet (25 mg total) by mouth 2 (two) times daily with a meal. 02/10/20   Meccariello, Solmon Ice, DO  hydrochlorothiazide (HYDRODIURIL) 25 MG tablet Take 1 tablet by mouth once daily 04/12/20   Meccariello, Solmon Ice, DO  metFORMIN (GLUCOPHAGE-XR) 500 MG 24 hr tablet Take 1 tablet (500 mg total) by mouth daily with breakfast. 12/16/19   Meccariello, Solmon Ice, DO  nicotine polacrilex (COMMIT) 4 MG lozenge Take 1 lozenge (4 mg total) by mouth as needed for smoking cessation. Patient not taking: Reported on 03/07/2020 02/28/20   Kathrin Ruddy, RPH-CPP  nortriptyline (PAMELOR) 25 MG capsule Take 1 capsule (25 mg total) by mouth at bedtime. 04/13/20   Meccariello, Solmon Ice, DO  ofloxacin (OCUFLOX) 0.3 % ophthalmic solution Use two drops on each  toenail twice daily for two weeks. 04/24/20   Meccariello, Solmon Ice, DO  potassium chloride SA (KLOR-CON) 20 MEQ tablet Take 1 tablet (20 mEq total) by mouth 2 (two) times daily. 03/16/20   Meccariello, Solmon Ice, DO    Allergies    Lisinopril  Review of Systems   Review of Systems  Constitutional: Negative for chills and fever.  HENT: Positive for congestion and sore throat. Negative for facial swelling, rhinorrhea and trouble swallowing.   Respiratory: Negative for shortness of breath.   Cardiovascular: Negative for chest pain.  Gastrointestinal: Negative for abdominal pain, diarrhea, nausea and vomiting.  Neurological: Negative for headaches.  All other systems reviewed and are negative.   Physical Exam Updated Vital Signs BP (!) 158/103 (BP Location: Left Arm)   Pulse 90   Temp 97.9 F (36.6 C) (Oral)   Resp 16   Ht 5\' 7"  (1.702 m)   Wt 133.8 kg   LMP 11/21/2012   SpO2 95%   BMI 46.20 kg/m   Physical Exam Vitals and nursing note reviewed.  Constitutional:      General: She is not in acute distress.    Appearance: She is not ill-appearing.  HENT:     Head: Normocephalic.     Mouth/Throat:     Comments: Posterior oropharynx clear and mucous membranes moist, there is mild erythema but no edema or tonsillar exudates, uvula midline, normal phonation, no trismus, tolerating secretions without difficulty. Eyes:     Pupils: Pupils are equal, round, and reactive to light.  Neck:     Comments: No meningismus. Cardiovascular:     Rate and Rhythm: Normal rate and regular rhythm.     Pulses: Normal pulses.     Heart sounds: Normal heart sounds. No murmur heard. No friction rub. No gallop.   Pulmonary:     Effort: Pulmonary effort is normal.     Breath sounds: Normal breath sounds.     Comments: Respirations equal and unlabored, patient able to speak in full sentences, lungs clear to auscultation bilaterally Abdominal:     General: Abdomen is flat. There is no distension.      Palpations: Abdomen is soft.     Tenderness: There is no abdominal tenderness. There is no guarding or rebound.  Musculoskeletal:     Cervical back: Neck supple.     Comments: Able to move all 4 extremities without difficulty.  Skin:    General: Skin is warm and dry.  Neurological:     General: No focal deficit present.  Mental Status: She is alert.  Psychiatric:        Mood and Affect: Mood normal.        Behavior: Behavior normal.     ED Results / Procedures / Treatments   Labs (all labs ordered are listed, but only abnormal results are displayed) Labs Reviewed  RESP PANEL BY RT-PCR (RSV, FLU A&B, COVID)  [id number]    EKG None  Radiology No results found.  Procedures Procedures (including critical care time)  Medications Ordered in ED Medications - No data to display  ED Course  I have reviewed the triage vital signs and the nursing notes.  Pertinent labs & imaging results that were available during my care of the patient were reviewed by me and considered in my medical decision making (see chart for details).    MDM Rules/Calculators/A&P                         54 year old female presents to the ED due to dry cough, sore throat, myalgias x2 days.  No sick contacts or known Covid exposures.  She has received both her Covid vaccines.  Denies chest pain and shortness of breath.  Upon arrival, patient is afebrile, not tachycardic or hypoxic.  Elevated BP at 158/103.  low suspicion for hypertensive urgency/emergency.  Patient in no acute distress and nontoxic-appearing.  Physical exam reassuring.  No tonsillar hypertrophy. Mild erythema. Low suspicion for strep throat. Patient tolerating oral secretions without difficulty.  No abscess appreciated on exam.  No meningismus to suggest meningitis.  Lungs clear to auscultation bilaterally.  No rales, rhonchi, or wheezing.  Low suspicion for pneumonia.  Covid/influenza/RSV negative.  Suspect symptoms related to other viral  etiology.  Will discharge patient with symptomatic treatment.  Instructed patient to follow-up with PCP if symptoms do not improve in the next week. Strict ED precautions discussed with patient. Patient states understanding and agrees to plan. Patient discharged home in no acute distress and stable vitals  Final Clinical Impression(s) / ED Diagnoses Final diagnoses:  Cough  Sore throat    Rx / DC Orders ED Discharge Orders         Ordered    benzonatate (TESSALON) 100 MG capsule  Every 8 hours        05/30/20 1510    lidocaine (XYLOCAINE) 2 % solution  As needed        05/30/20 1510           Jesusita Oka 05/30/20 1512    Milagros Loll, MD 06/01/20 [phone]

## 2020-05-30 NOTE — ED Notes (Signed)
Patienat went to the bathroom immediately when name called for triage.

## 2020-05-30 NOTE — Discharge Instructions (Signed)
As discussed, your Covid and influenza test were negative.  I am sending him with cough medication and numbing solution for your throat.  Take as needed.  You may also take over-the-counter ibuprofen or Tylenol as needed for body aches and fever.  Please follow-up with PCP if symptoms not improved within the next week.  Return to the ER for new or worsening symptoms.

## 2020-05-31 NOTE — ED Notes (Signed)
Opened chart to print work note at pts request.

## 2020-06-08 ENCOUNTER — Ambulatory Visit (INDEPENDENT_AMBULATORY_CARE_PROVIDER_SITE_OTHER): Payer: BLUE CROSS/BLUE SHIELD | Admitting: Podiatry

## 2020-06-08 ENCOUNTER — Other Ambulatory Visit: Payer: Self-pay

## 2020-06-08 DIAGNOSIS — M79674 Pain in right toe(s): Secondary | ICD-10-CM | POA: Diagnosis not present

## 2020-06-08 DIAGNOSIS — B351 Tinea unguium: Secondary | ICD-10-CM | POA: Diagnosis not present

## 2020-06-08 DIAGNOSIS — M79675 Pain in left toe(s): Secondary | ICD-10-CM

## 2020-06-08 MED ORDER — CICLOPIROX 8 % EX SOLN: Freq: Every day | CUTANEOUS | 3 refills | Status: DC

## 2020-06-08 MED ORDER — TERBINAFINE HCL 250 MG PO TABS: 250.0000 mg | ORAL_TABLET | Freq: Every day | ORAL | 0 refills | Status: AC

## 2020-06-11 NOTE — Progress Notes (Signed)
  Subjective:  Patient ID: Virginia Levine, female    DOB: 1965-09-16,  MRN: [id number]  Chief Complaint  Patient presents with  . Nail Problem    Bilateral hallux nail are thick and discolored. Nail trim    55 y.o. female presents with the above complaint. History confirmed with patient. Nail is split on L hallux  Objective:  Physical Exam: warm, good capillary refill, no trophic changes or ulcerative lesions, normal DP and PT pulses and normal sensory exam. Onychomycosis x10 with splitting of left hallux nail, thickened brown and discoloration    Assessment:  No diagnosis found.   Plan:  Patient was evaluated and treated and all questions answered.  Discussed the etiology and treatment options for the condition in detail with the patient. Educated patient on the topical and oral treatment options for mycotic nails. Recommended debridement of the nails today. Sharp and mechanical debridement performed of all painful and mycotic nails today. Nails debrided in length and thickness using a nail nipper and a mechanical burr to level of comfort. Discussed treatment options including appropriate shoe gear. Follow up as needed for painful nails.  Recommend dual therapy with lamisil and terbinafine. Rx sent to her pharmacy. No hx of liver disease or heavy alcohol use  No follow-ups on file.

## 2020-06-13 ENCOUNTER — Other Ambulatory Visit: Payer: Self-pay | Admitting: Family Medicine

## 2020-06-13 DIAGNOSIS — I10 Essential (primary) hypertension: Secondary | ICD-10-CM

## 2020-06-15 ENCOUNTER — Other Ambulatory Visit: Payer: Self-pay | Admitting: Family Medicine

## 2020-06-15 ENCOUNTER — Telehealth: Payer: Self-pay

## 2020-06-15 DIAGNOSIS — I208 Other forms of angina pectoris: Secondary | ICD-10-CM

## 2020-06-15 NOTE — Progress Notes (Signed)
Patient concerned that tightening in back that comes and goes is related to her heart.   Requesting cardiology referral.  See phone note.

## 2020-06-15 NOTE — Telephone Encounter (Signed)
Called and spoke with patient.  She is concerned that she has been having tightening in he back, usually in the morning, sometimes while eating, that resolvs spontaneously after 10-55min, no SOB with this.  States that it used to happen more frequently when she was smoking.  She is very concerned that something is wrong with her heart and would like a referral to cardiology.  Given that this could be stable angina and patient has risk factors including weight, tobacco use, pre-diabetes, will agree to refer her.  Advised to make appointment in office as well.  Advised that is this changes, becomes more frequent, has shortness of breath, or comes to chest, should be seen right away.  She will also call to make appointment with me soon.

## 2020-06-15 NOTE — Telephone Encounter (Signed)
Patient LVM on nurse line requesting a call back from her PCP. Patient reports this is a Personnel officer and would only like to discuss with Meccariello. Will forward.

## 2020-06-23 ENCOUNTER — Ambulatory Visit: Payer: No Typology Code available for payment source | Admitting: Cardiology

## 2020-06-29 ENCOUNTER — Telehealth: Payer: Self-pay

## 2020-06-29 NOTE — Telephone Encounter (Signed)
Patient calls nurse line regarding potassium rx. Patient reports that she is taking three potassium pills daily. Upon chart review, I am only able to find that patient is taking 1 tablet BID.   Please advise how patient is to be taking potassium pills, and if she is to be taking three/day, please update rx.   To PCP  Veronda Prude, RN

## 2020-06-29 NOTE — Telephone Encounter (Signed)
Please let patient know that she can take potassium twice daily.  We can just recheck at her next visit, but three times a day can be difficult to time and she shouldn't need that much!

## 2020-06-30 ENCOUNTER — Other Ambulatory Visit: Payer: Self-pay | Admitting: Family Medicine

## 2020-06-30 DIAGNOSIS — I10 Essential (primary) hypertension: Secondary | ICD-10-CM

## 2020-06-30 MED ORDER — POTASSIUM CHLORIDE CRYS ER 20 MEQ PO TBCR: 20.0000 meq | EXTENDED_RELEASE_TABLET | Freq: Two times a day (BID) | ORAL | 2 refills | Status: DC

## 2020-06-30 NOTE — Telephone Encounter (Signed)
Pt informed and she said she needs a refill on this.  She said she has enough to get to Sunday. Routing to PCP. Robb Sibal Zimmerman Rumple, CMA

## 2020-06-30 NOTE — Telephone Encounter (Signed)
Rx sent 

## 2020-07-01 NOTE — Progress Notes (Unsigned)
Cardiology Office Note:    Date:  07/04/2020   ID:  Virginia Levine, DOB 1966-01-11, MRN [id number]  PCP:  Cleophas Dunker, DO  [facility] Cardiologist:  Freada Bergeron, MD  [facility] Electrophysiologist:  None   Referring MD: Zenia Resides, MD    History of Present Illness:    Virginia Levine is a 55 y.o. female with a hx of HTN, HLD, OSA on CPAP, obesity and DMII who was referred by Dr. Andria Frames for further management of back/chest tightness.  The patient states that she has been having episodes of chest and back tightness that has been ongoing for the past several months. Occurs intermittently throughout the day. She cannot predict when it comes on. Not exertional in nature and not made worse with walking. She thinks it may be related to smoking or after sleeping on an air mattress. Has occasional LE edema and she has cut out salt. No orthopnea, PND, palpitations, lightheadedness or dizziness. She does note worsening DOE and she feels like her exercise capacity has really gone down. Has gained significant amount of weight (70lbs) since 06/2019. She states she feels overall unwell and is concerned that it is her heart.  She is a current smoker but is trying to quit. Interested in starting nicotine patches.  Family history: Father with MI, Brother with ESRD and possible heart issues, mother with leukemia. Sisters with DMII.    Past Medical History:  Diagnosis Date  . Hyperlipidemia   . Hypertension   . Obesity   . OSA (obstructive sleep apnea)   . Pre-diabetes   . Tobacco use     Past Surgical History:  Procedure Laterality Date  . LEEP  2018    Current Medications: Current Meds  Medication Sig  . amLODipine (NORVASC) 5 MG tablet Take 1 tablet (5 mg total) by mouth daily.  Marland Kitchen aspirin 81 MG EC tablet Take 1 tablet by mouth daily.  Marland Kitchen atorvastatin (LIPITOR) 40 MG tablet Take 1 tablet (40 mg total) by mouth daily.  . carvedilol (COREG) 25 MG tablet Take 1  tablet (25 mg total) by mouth 2 (two) times daily with a meal.  . ciclopirox (PENLAC) 8 % solution Apply topically at bedtime. Apply over nail and surrounding skin. Apply daily over previous coat. After seven (7) days, may remove with alcohol and continue cycle.  . hydrochlorothiazide (HYDRODIURIL) 25 MG tablet Take 1 tablet by mouth once daily  . losartan (COZAAR) 100 MG tablet Take 1 tablet by mouth daily.  . metFORMIN (GLUCOPHAGE-XR) 500 MG 24 hr tablet Take 1 tablet (500 mg total) by mouth daily with breakfast.  . metoprolol tartrate (LOPRESSOR) 100 MG tablet Take one tablet by mouth 2 hours prior to CT Scan  . nicotine (NICODERM CQ - DOSED IN MG/24 HOURS) 14 mg/24hr patch Place 1 patch (14 mg total) onto the skin daily.  . potassium chloride SA (KLOR-CON) 20 MEQ tablet Take 1 tablet (20 mEq total) by mouth 2 (two) times daily.  . simethicone (MYLICON) 80 MG chewable tablet Chew 1 tablet (80 mg total) by mouth 3 (three) times daily as needed.  . simvastatin (ZOCOR) 20 MG tablet Take by mouth.  . terbinafine (LAMISIL) 250 MG tablet Take 1 tablet (250 mg total) by mouth daily.     Allergies:   Lisinopril   Social History   Socioeconomic History  . Marital status: Single    Spouse name: Not on file  . Number of children: 0  .  Years of education: Not on file  . Highest education level: Some college, no degree  Occupational History  . Not on file  Tobacco Use  . Smoking status: Light Tobacco Smoker    Packs/day: 0.15    Types: Cigarettes  . Smokeless tobacco: Never Used  . Tobacco comment: smokes with drinking more (once a month), 5-6 cigarettes a day  Vaping Use  . Vaping Use: Never used  Substance and Sexual Activity  . Alcohol use: Yes    Comment: occasional  . Drug use: No  . Sexual activity: Never  Other Topics Concern  . Not on file  Social History Narrative  . Not on file   Social Determinants of Health   Financial Resource Strain: Not on file  Food Insecurity: Not  on file  Transportation Needs: No Transportation Needs  . Lack of Transportation (Medical): No  . Lack of Transportation (Non-Medical): No  Physical Activity: Not on file  Stress: Not on file  Social Connections: Not on file     Family History: The patient's family history includes Aneurysm in her sister and sister; Diabetes in her maternal grandmother and mother; Drug abuse in her sister; Heart attack in her father; Hypertension in her brother; Kidney failure in her brother; Leukemia (age of onset: 40) in her mother.  ROS:   Please see the history of present illness.    Review of Systems  Constitutional: Positive for malaise/fatigue. Negative for chills, fever and weight loss.  HENT: Negative for congestion.   Eyes: Negative for blurred vision and double vision.  Respiratory: Positive for shortness of breath.   Cardiovascular: Positive for chest pain and leg swelling. Negative for palpitations, orthopnea, claudication and PND.  Gastrointestinal: Negative for heartburn and melena.  Genitourinary: Negative for flank pain and urgency.  Musculoskeletal: Positive for back pain and myalgias.  Neurological: Negative for dizziness and loss of consciousness.  Endo/Heme/Allergies: Negative for polydipsia.  Psychiatric/Behavioral: Negative for substance abuse.    EKGs/Labs/Other Studies Reviewed:    The following studies were reviewed today: No cardiac studies in the system  EKG:  EKG is ordered today.  The ekg ordered today demonstrates NSR with HR 87, single PVC  Recent Labs: 02/10/2020: TSH 1.510 03/23/2020: BUN CANCELED; Creatinine, Ser CANCELED; Potassium CANCELED; Sodium CANCELED  Recent Lipid Panel    Component Value Date/Time   CHOL 161 02/10/2020 1720   TRIG 212 (H) 02/10/2020 1720   HDL 35 (L) 02/10/2020 1720   CHOLHDL 4.6 (H) 02/10/2020 1720   CHOLHDL 5.9 06/30/2012 1626   VLDL 30 06/30/2012 1626   LDLCALC 90 02/10/2020 1720     Risk Assessment/Calculations:        Physical Exam:    VS:  BP 132/90   Pulse 87   Ht _0  (1.702 m)   Wt (!) 310 lb 9.6 oz (140.9 kg)   LMP 11/21/2012   SpO2 95%   BMI 48.65 kg/m     Wt Readings from Last 3 Encounters:  07/04/20 (!) 310 lb 9.6 oz (140.9 kg)  05/30/20 295 lb (133.8 kg)  04/13/20 (!) 307 lb (139.3 kg)     GEN:  Well nourished, well developed in no acute distress HEENT: Normal NECK: No JVD; No carotid bruits CARDIAC: RRR, no murmurs, rubs, gallops RESPIRATORY:  Clear to auscultation without rales, wheezing or rhonchi  ABDOMEN: Soft, non-tender, non-distended MUSCULOSKELETAL:  Trace edema to the shins; warm SKIN: Warm and dry NEUROLOGIC:  Alert and oriented x 3 PSYCHIATRIC:  Normal  affect   ASSESSMENT:    1. Precordial pain   2. Primary hypertension   3. Pure hypercholesterolemia   4. Morbid obesity ([place])   5. Tobacco abuse   6. Type 2 diabetes mellitus without complication, without long-term current use of insulin (HCC)    PLAN:    In order of problems listed above:  #Chest/Back Pain: #Dyspnea on Exertion: Chest pain is atypical in nature as symptoms do not seem be to be exertional in nature. Has been having significant DOE as well and decreased exercise capacity which is more concerning. Notably has gained 70lbs in 1 year which may be contributing to symptoms. Given risk factors including HTN, HLD, DMII, morbid obesity and family history, will pursue ischemic work-up. -Check coronary CTA -Check TTE  #HTN: Well controlled. -Continue amlodipine 52m -Continue coreg 261mBID -Continue losartan 10056maily  #HLD: -Continue simvastatin 57m15mily  #DMII: -Continue metformin -Will inquire about GLP-1 agonist as would also aide in weight loss  #Morbid Obesity: Extensive counseling provided today about diet and exercise. Patient is very motivated to lose weight. -Will refer to weight loss clinic -Discussed Mediterranean diet at length -Increase daily exercise as  tolerated -Will inquire with pharmacy about eligibility for GLP-1 agonist  #Tobacco Abuse: Extensive counseling today about tobacco cessation and patient is motivated to quit. Did not respond to lozenges or wellbutrin in the past, but nicotine patches have helped. Will do trial of nicotine patches and follow-up. -Start nicotine patches and continue to counsel about tobacco cessation     Medication Adjustments/Labs and Tests Ordered: Current medicines are reviewed at length with the patient today.  Concerns regarding medicines are outlined above.  Orders Placed This Encounter  Procedures  . CT CORONARY MORPH W/CTA COR W/SCORE W/CA W/CM &/OR WO/CM  . CT CORONARY FRACTIONAL FLOW RESERVE DATA PREP  . CT CORONARY FRACTIONAL FLOW RESERVE FLUID ANALYSIS  . Basic Metabolic Panel (BMET)  . Amb Ref to Medical Weight Management  . EKG 12-Lead  . ECHOCARDIOGRAM COMPLETE   Meds ordered this encounter  Medications  . simethicone (MYLICON) 80 MG chewable tablet    Sig: Chew 1 tablet (80 mg total) by mouth 3 (three) times daily as needed.    Dispense:  60 tablet    Refill:  3  . nicotine (NICODERM CQ - DOSED IN MG/24 HOURS) 14 mg/24hr patch    Sig: Place 1 patch (14 mg total) onto the skin daily.    Dispense:  28 patch    Refill:  0  . metoprolol tartrate (LOPRESSOR) 100 MG tablet    Sig: Take one tablet by mouth 2 hours prior to CT Scan    Dispense:  1 tablet    Refill:  0    Patient Instructions   Medication Instructions:  Your physician has recommended you make the following change in your medication:  Start nicotine patch daily.  Follow instructions on package. Start Simethicone 80 mg chewable tablets by mouth three times daily as needed *If you need a refill on your cardiac medications before your next appointment, please call your pharmacy*   Lab Work: Lab work to be done today--BMP If you have labs (blood work) drawn today and your tests are completely normal, you will receive  your results only by: . MyMarland Kitchenhart Message (if you have MyChart) OR . A paper copy in the mail If you have any lab test that is abnormal or we need to change your treatment, we will call you to review  the results.   Testing/Procedures: Your physician has requested that you have an echocardiogram. Echocardiography is a painless test that uses sound waves to create images of your heart. It provides your doctor with information about the size and shape of your heart and how well your heart's chambers and valves are working. This procedure takes approximately one hour. There are no restrictions for this procedure.  Your physician has requested that you have cardiac CT. Cardiac computed tomography (CT) is a painless test that uses an x-ray machine to take clear, detailed pictures of your heart. For further information please visit [email or web address]. Please follow instruction sheet as given.      Follow-Up: At [facility], you and your health needs are our priority.  As part of our continuing mission to provide you with exceptional heart care, we have created designated Provider Care Teams.  These Care Teams include your primary Cardiologist (physician) and Advanced Practice Providers (APPs -  Physician Assistants and Nurse Practitioners) who all work together to provide you with the care you need, when you need it.  We recommend signing up for the patient portal called "MyChart".  Sign up information is provided on this After Visit Summary.  MyChart is used to connect with patients for Virtual Visits (Telemedicine).  Patients are able to view lab/test results, encounter notes, upcoming appointments, etc.  Non-urgent messages can be sent to your provider as well.   To learn more about what you can do with MyChart, go to [email or web address].    Your next appointment:   3 month(s)  The format for your next appointment:   In Person  Provider:   You may see Freada Bergeron, MD or one of  the following Advanced Practice Providers on your designated Care Team:    Richardson Dopp, PA-C  Vin Parnell, [place]    Other Instructions  Your cardiac CT will be scheduled at one of the below locations:   [facility] [address] [place], [place] [address] [phone]  [place] [address] [place], [place] [address] [phone]  If scheduled at [facility], please arrive at the [facility] main entrance of [facility] 30 minutes prior to test start time. Proceed to the [facility] Radiology Department (first floor) to check-in and test prep.  If scheduled at [facility], please arrive 15 mins early for check-in and test prep.  Please follow these instructions carefully (unless otherwise directed):  Hold all erectile dysfunction medications at least 3 days (72 hrs) prior to test.  On the Night Before the Test: . Be sure to Drink plenty of water. . Do not consume any caffeinated/decaffeinated beverages or chocolate 12 hours prior to your test. . Do not take any antihistamines 12 hours prior to your test.  On the Day of the Test: . Drink plenty of water. Do not drink any water within one hour of the test. . Do not eat any food 4 hours prior to the test. . You may take your regular medications prior to the test.  . Take metoprolol (Lopressor) two hours prior to test.  Do not take Carvedilol the morning of the test . HOLD Furosemide/Hydrochlorothiazide morning of the test. . FEMALES- please wear underwire-free bra if available  .       After the Test: . Drink plenty of water. . After receiving IV contrast, you may experience a mild flushed feeling. This is normal. .  On occasion, you may experience a mild rash up to 24 hours after the test. This is not dangerous. If this occurs, you can take Benadryl 25 mg and increase your fluid intake. . If you experience  trouble breathing, this can be serious. If it is severe call 911 IMMEDIATELY. If it is mild, please call our office. . If you take any of these medications: Glipizide/Metformin, Avandament, Glucavance, please do not take 48 hours after completing test unless otherwise instructed.   Once we have confirmed authorization from your insurance company, we will call you to set up a date and time for your test. Based on how quickly your insurance processes prior authorizations requests, please allow up to 4 weeks to be contacted for scheduling your Cardiac CT appointment. Be advised that routine Cardiac CT appointments could be scheduled as many as 8 weeks after your provider has ordered it.  For non-scheduling related questions, please contact the cardiac imaging nurse navigator should you have any questions/concerns: Marchia Bond, Cardiac Imaging Nurse Navigator Burley Saver, Interim Cardiac Imaging Nurse Hall and Vascular Services Direct Office Dial: [phone]   For scheduling needs, including cancellations and rescheduling, please call [place], [phone].  You have been referred to Healthy weight and wellness   Mediterranean Diet A Mediterranean diet refers to food and lifestyle choices that are based on the traditions of countries located on the [employer]. This way of eating has been shown to help prevent certain conditions and improve outcomes for people who have chronic diseases, like kidney disease and heart disease. What are tips for following this plan? Lifestyle  Cook and eat meals together with your family, when possible.  Drink enough fluid to keep your urine clear or pale yellow.  Be physically active every day. This includes: ? Aerobic exercise like running or swimming. ? Leisure activities like gardening, walking, or housework.  Get 7-8 hours of sleep each night.  If recommended by your health care provider, drink red wine in moderation. This  means 1 glass a day for nonpregnant women and 2 glasses a day for men. A glass of wine equals 5 oz (150 mL). Reading food labels  Check the serving size of packaged foods. For foods such as rice and pasta, the serving size refers to the amount of cooked product, not dry.  Check the total fat in packaged foods. Avoid foods that have saturated fat or trans fats.  Check the ingredients list for added sugars, such as corn syrup.   Shopping  At the grocery store, buy most of your food from the areas near the walls of the store. This includes: ? Fresh fruits and vegetables (produce). ? Grains, beans, nuts, and seeds. Some of these may be available in unpackaged forms or large amounts (in bulk). ? Fresh seafood. ? Poultry and eggs. ? Low-fat dairy products.  Buy whole ingredients instead of prepackaged foods.  Buy fresh fruits and vegetables in-season from local farmers markets.  Buy frozen fruits and vegetables in resealable bags.  If you do not have access to quality fresh seafood, buy precooked frozen shrimp or canned fish, such as tuna, salmon, or sardines.  Buy small amounts of raw or cooked vegetables, salads, or olives from the deli or salad bar at your store.  Stock your pantry so you always have certain foods on hand, such as olive oil, canned tuna, canned tomatoes, rice, pasta, and beans. Cooking  Cook foods with extra-virgin olive oil instead of using butter or  other vegetable oils.  Have meat as a side dish, and have vegetables or grains as your main dish. This means having meat in small portions or adding small amounts of meat to foods like pasta or stew.  Use beans or vegetables instead of meat in common dishes like chili or lasagna.  Experiment with different cooking methods. Try roasting or broiling vegetables instead of steaming or sauteing them.  Add frozen vegetables to soups, stews, pasta, or rice.  Add nuts or seeds for added healthy fat at each meal. You can  add these to yogurt, salads, or vegetable dishes.  Marinate fish or vegetables using olive oil, lemon juice, garlic, and fresh herbs. Meal planning  Plan to eat 1 vegetarian meal one day each week. Try to work up to 2 vegetarian meals, if possible.  Eat seafood 2 or more times a week.  Have healthy snacks readily available, such as: ? Vegetable sticks with hummus. ? [place] yogurt. ? Fruit and nut trail mix.  Eat balanced meals throughout the week. This includes: ? Fruit: 2-3 servings a day ? Vegetables: 4-5 servings a day ? Low-fat dairy: 2 servings a day ? Fish, poultry, or lean meat: 1 serving a day ? Beans and legumes: 2 or more servings a week ? Nuts and seeds: 1-2 servings a day ? Whole grains: 6-8 servings a day ? Extra-virgin olive oil: 3-4 servings a day  Limit red meat and sweets to only a few servings a month   What are my food choices?  Mediterranean diet ? Recommended  Grains: Whole-grain pasta. Brown rice. Bulgar wheat. Polenta. Couscous. Whole-wheat bread. [place].  Vegetables: Artichokes. Beets. Broccoli. Cabbage. Carrots. Eggplant. Green beans. Chard. Kale. Spinach. Onions. Leeks. Peas. Squash. Tomatoes. Peppers. Radishes.  Fruits: Apples. Apricots. Avocado. Berries. Bananas. Cherries. Dates. Figs. Grapes. Lemons. Melon. Oranges. Peaches. Plums. Pomegranate.  Meats and other protein foods: Beans. Almonds. Sunflower seeds. Pine nuts. Peanuts. [place]. Salmon. Scallops. Shrimp. [place]. Tilapia. Clams. Oysters. Eggs.  Dairy: Low-fat milk. Cheese. Greek yogurt.  Beverages: Water. Red wine. Herbal tea.  Fats and oils: Extra virgin olive oil. Avocado oil. Grape seed oil.  Sweets and desserts: [place] yogurt with honey. Baked apples. Poached pears. Trail mix.  Seasoning and other foods: Basil. Cilantro. Coriander. Cumin. Mint. Parsley. Sage. Rosemary. Tarragon. Garlic. Oregano. Thyme. Pepper. Balsalmic vinegar. Tahini. Hummus. Tomato sauce. Olives.  Mushrooms. ? Limit these  Grains: Prepackaged pasta or rice dishes. Prepackaged cereal with added sugar.  Vegetables: Deep fried potatoes (french fries).  Fruits: Fruit canned in syrup.  Meats and other protein foods: Beef. Pork. Lamb. Poultry with skin. Hot dogs. Berniece Salines.  Dairy: Ice cream. Sour cream. Whole milk.  Beverages: Juice. Sugar-sweetened soft drinks. Beer. Liquor and spirits.  Fats and oils: Butter. Canola oil. Vegetable oil. Beef fat (tallow). Lard.  Sweets and desserts: Cookies. Cakes. Pies. Candy.  Seasoning and other foods: Mayonnaise. Premade sauces and marinades. The items listed may not be a complete list. Talk with your dietitian about what dietary choices are right for you. Summary  The Mediterranean diet includes both food and lifestyle choices.  Eat a variety of fresh fruits and vegetables, beans, nuts, seeds, and whole grains.  Limit the amount of red meat and sweets that you eat.  Talk with your health care provider about whether it is safe for you to drink red wine in moderation. This means 1 glass a day for nonpregnant women and 2 glasses a day for men. A glass of wine equals  5 oz (150 mL). This information is not intended to replace advice given to you by your health care provider. Make sure you discuss any questions you have with your health care provider. Document Revised: 01/18/2016 Document Reviewed: 01/11/2016 Elsevier Patient Education  2020 [place], Freada Bergeron, MD  07/04/2020 5:03 PM    [facility]

## 2020-07-04 ENCOUNTER — Encounter: Payer: Self-pay | Admitting: Cardiology

## 2020-07-04 ENCOUNTER — Other Ambulatory Visit: Payer: Self-pay

## 2020-07-04 ENCOUNTER — Ambulatory Visit (INDEPENDENT_AMBULATORY_CARE_PROVIDER_SITE_OTHER): Payer: BLUE CROSS/BLUE SHIELD | Admitting: Cardiology

## 2020-07-04 VITALS — BP 132/90 | HR 87 | Ht 67.0 in | Wt 310.6 lb

## 2020-07-04 DIAGNOSIS — E7800 Pure hypercholesterolemia, unspecified: Secondary | ICD-10-CM | POA: Diagnosis not present

## 2020-07-04 DIAGNOSIS — R072 Precordial pain: Secondary | ICD-10-CM

## 2020-07-04 DIAGNOSIS — I10 Essential (primary) hypertension: Secondary | ICD-10-CM

## 2020-07-04 DIAGNOSIS — Z720 Tobacco use: Secondary | ICD-10-CM

## 2020-07-04 DIAGNOSIS — E119 Type 2 diabetes mellitus without complications: Secondary | ICD-10-CM

## 2020-07-04 MED ORDER — NICOTINE 14 MG/24HR TD PT24: 14.0000 mg | MEDICATED_PATCH | Freq: Every day | TRANSDERMAL | 0 refills | Status: DC

## 2020-07-04 MED ORDER — SIMETHICONE 80 MG PO CHEW: 80.0000 mg | CHEWABLE_TABLET | Freq: Three times a day (TID) | ORAL | 3 refills | Status: DC | PRN

## 2020-07-04 MED ORDER — METOPROLOL TARTRATE 100 MG PO TABS: ORAL_TABLET | ORAL | 0 refills | Status: DC

## 2020-07-04 NOTE — Patient Instructions (Addendum)
Medication Instructions:  Your physician has recommended you make the following change in your medication:  Start nicotine patch daily.  Follow instructions on package. Start Simethicone 80 mg chewable tablets by mouth three times daily as needed *If you need a refill on your cardiac medications before your next appointment, please call your pharmacy*   Lab Work: Your physician recommends that you return for lab work prior to CT Scan--BMP  If you have labs (blood work) drawn today and your tests are completely normal, you will receive your results only by: Marland Kitchen MyChart Message (if you have MyChart) OR . A paper copy in the mail If you have any lab test that is abnormal or we need to change your treatment, we will call you to review the results.   Testing/Procedures: Your physician has requested that you have an echocardiogram. Echocardiography is a painless test that uses sound waves to create images of your heart. It provides your doctor with information about the size and shape of your heart and how well your heart's chambers and valves are working. This procedure takes approximately one hour. There are no restrictions for this procedure.  Your physician has requested that you have cardiac CT. Cardiac computed tomography (CT) is a painless test that uses an x-ray machine to take clear, detailed pictures of your heart. For further information please visit [email or web address]. Please follow instruction sheet as given.      Follow-Up: At [facility], you and your health needs are our priority.  As part of our continuing mission to provide you with exceptional heart care, we have created designated Provider Care Teams.  These Care Teams include your primary Cardiologist (physician) and Advanced Practice Providers (APPs -  Physician Assistants and Nurse Practitioners) who all work together to provide you with the care you need, when you need it.  We recommend signing up for the patient  portal called "MyChart".  Sign up information is provided on this After Visit Summary.  MyChart is used to connect with patients for Virtual Visits (Telemedicine).  Patients are able to view lab/test results, encounter notes, upcoming appointments, etc.  Non-urgent messages can be sent to your provider as well.   To learn more about what you can do with MyChart, go to [email or web address].    Your next appointment:   3 month(s)  The format for your next appointment:   In Person  Provider:   You may see Freada Bergeron, MD or one of the following Advanced Practice Providers on your designated Care Team:    Richardson Dopp, PA-C  [place], [place]    Other Instructions  Your cardiac CT will be scheduled at one of the below locations:   [facility] [address] [place], [place] [address] [phone]  [place] [address] [place], [place] [address] [phone]  If scheduled at [facility], please arrive at the [facility] main entrance of [facility] 30 minutes prior to test start time. Proceed to the [facility] Radiology Department (first floor) to check-in and test prep.  If scheduled at [facility], please arrive 15 mins early for check-in and test prep.  Please follow these instructions carefully (unless otherwise directed):  Hold all erectile dysfunction medications at least 3 days (72 hrs) prior to test.  On the Night Before the Test: . Be sure to Drink plenty of water. . Do not consume any caffeinated/decaffeinated beverages or chocolate  12 hours prior to your test. . Do not take any antihistamines 12 hours prior to your test.  On the Day of the Test: . Drink plenty of water. Do not drink any water within one hour of the test. . Do not eat any food 4 hours prior to the test. . You may take your regular medications prior to the test.  . Take  metoprolol (Lopressor) two hours prior to test.  Do not take Carvedilol the morning of the test . HOLD Furosemide/Hydrochlorothiazide morning of the test. . FEMALES- please wear underwire-free bra if available  .       After the Test: . Drink plenty of water. . After receiving IV contrast, you may experience a mild flushed feeling. This is normal. . On occasion, you may experience a mild rash up to 24 hours after the test. This is not dangerous. If this occurs, you can take Benadryl 25 mg and increase your fluid intake. . If you experience trouble breathing, this can be serious. If it is severe call 911 IMMEDIATELY. If it is mild, please call our office. . If you take any of these medications: Glipizide/Metformin, Avandament, Glucavance, please do not take 48 hours after completing test unless otherwise instructed.   Once we have confirmed authorization from your insurance company, we will call you to set up a date and time for your test. Based on how quickly your insurance processes prior authorizations requests, please allow up to 4 weeks to be contacted for scheduling your Cardiac CT appointment. Be advised that routine Cardiac CT appointments could be scheduled as many as 8 weeks after your provider has ordered it.  For non-scheduling related questions, please contact the cardiac imaging nurse navigator should you have any questions/concerns: Marchia Bond, Cardiac Imaging Nurse Navigator Burley Saver, Interim Cardiac Imaging Nurse [place] [facility] Direct Office Dial: [phone]   For scheduling needs, including cancellations and rescheduling, please call [place], [phone].  You have been referred to Healthy weight and wellness   Mediterranean Diet A Mediterranean diet refers to food and lifestyle choices that are based on the traditions of countries located on the [employer]. This way of eating has been shown to help prevent certain  conditions and improve outcomes for people who have chronic diseases, like kidney disease and heart disease. What are tips for following this plan? Lifestyle  Cook and eat meals together with your family, when possible.  Drink enough fluid to keep your urine clear or pale yellow.  Be physically active every day. This includes: ? Aerobic exercise like running or swimming. ? Leisure activities like gardening, walking, or housework.  Get 7-8 hours of sleep each night.  If recommended by your health care provider, drink red wine in moderation. This means 1 glass a day for nonpregnant women and 2 glasses a day for men. A glass of wine equals 5 oz (150 mL). Reading food labels  Check the serving size of packaged foods. For foods such as rice and pasta, the serving size refers to the amount of cooked product, not dry.  Check the total fat in packaged foods. Avoid foods that have saturated fat or trans fats.  Check the ingredients list for added sugars, such as corn syrup.   Shopping  At the grocery store, buy most of your food from the areas near the walls of the store. This includes: ? Fresh fruits and vegetables (produce). ? Grains, beans, nuts, and seeds. Some of these may  be available in unpackaged forms or large amounts (in bulk). ? Fresh seafood. ? Poultry and eggs. ? Low-fat dairy products.  Buy whole ingredients instead of prepackaged foods.  Buy fresh fruits and vegetables in-season from local farmers markets.  Buy frozen fruits and vegetables in resealable bags.  If you do not have access to quality fresh seafood, buy precooked frozen shrimp or canned fish, such as tuna, salmon, or sardines.  Buy small amounts of raw or cooked vegetables, salads, or olives from the deli or salad bar at your store.  Stock your pantry so you always have certain foods on hand, such as olive oil, canned tuna, canned tomatoes, rice, pasta, and beans. Cooking  Cook foods with extra-virgin  olive oil instead of using butter or other vegetable oils.  Have meat as a side dish, and have vegetables or grains as your main dish. This means having meat in small portions or adding small amounts of meat to foods like pasta or stew.  Use beans or vegetables instead of meat in common dishes like chili or lasagna.  Experiment with different cooking methods. Try roasting or broiling vegetables instead of steaming or sauteing them.  Add frozen vegetables to soups, stews, pasta, or rice.  Add nuts or seeds for added healthy fat at each meal. You can add these to yogurt, salads, or vegetable dishes.  Marinate fish or vegetables using olive oil, lemon juice, garlic, and fresh herbs. Meal planning  Plan to eat 1 vegetarian meal one day each week. Try to work up to 2 vegetarian meals, if possible.  Eat seafood 2 or more times a week.  Have healthy snacks readily available, such as: ? Vegetable sticks with hummus. ? [place] yogurt. ? Fruit and nut trail mix.  Eat balanced meals throughout the week. This includes: ? Fruit: 2-3 servings a day ? Vegetables: 4-5 servings a day ? Low-fat dairy: 2 servings a day ? Fish, poultry, or lean meat: 1 serving a day ? Beans and legumes: 2 or more servings a week ? Nuts and seeds: 1-2 servings a day ? Whole grains: 6-8 servings a day ? Extra-virgin olive oil: 3-4 servings a day  Limit red meat and sweets to only a few servings a month   What are my food choices?  Mediterranean diet ? Recommended  Grains: Whole-grain pasta. Brown rice. Bulgar wheat. Polenta. Couscous. Whole-wheat bread. [place].  Vegetables: Artichokes. Beets. Broccoli. Cabbage. Carrots. Eggplant. Green beans. Chard. Kale. Spinach. Onions. Leeks. Peas. Squash. Tomatoes. Peppers. Radishes.  Fruits: Apples. Apricots. Avocado. Berries. Bananas. Cherries. Dates. Figs. Grapes. Lemons. Melon. Oranges. Peaches. Plums. Pomegranate.  Meats and other protein foods: Beans.  Almonds. Sunflower seeds. Pine nuts. Peanuts. [place]. Salmon. Scallops. Shrimp. [place]. Tilapia. Clams. Oysters. Eggs.  Dairy: Low-fat milk. Cheese. Greek yogurt.  Beverages: Water. Red wine. Herbal tea.  Fats and oils: Extra virgin olive oil. Avocado oil. Grape seed oil.  Sweets and desserts: [place] yogurt with honey. Baked apples. Poached pears. Trail mix.  Seasoning and other foods: Basil. Cilantro. Coriander. Cumin. Mint. Parsley. Sage. Rosemary. Tarragon. Garlic. Oregano. Thyme. Pepper. Balsalmic vinegar. Tahini. Hummus. Tomato sauce. Olives. Mushrooms. ? Limit these  Grains: Prepackaged pasta or rice dishes. Prepackaged cereal with added sugar.  Vegetables: Deep fried potatoes (french fries).  Fruits: Fruit canned in syrup.  Meats and other protein foods: Beef. Pork. Lamb. Poultry with skin. Hot dogs. Berniece Salines.  Dairy: Ice cream. Sour cream. Whole milk.  Beverages: Juice. Sugar-sweetened soft drinks. Beer. Liquor and spirits.  Fats and oils: Butter. Canola oil. Vegetable oil. Beef fat (tallow). Lard.  Sweets and desserts: Cookies. Cakes. Pies. Candy.  Seasoning and other foods: Mayonnaise. Premade sauces and marinades. The items listed may not be a complete list. Talk with your dietitian about what dietary choices are right for you. Summary  The Mediterranean diet includes both food and lifestyle choices.  Eat a variety of fresh fruits and vegetables, beans, nuts, seeds, and whole grains.  Limit the amount of red meat and sweets that you eat.  Talk with your health care provider about whether it is safe for you to drink red wine in moderation. This means 1 glass a day for nonpregnant women and 2 glasses a day for men. A glass of wine equals 5 oz (150 mL). This information is not intended to replace advice given to you by your health care provider. Make sure you discuss any questions you have with your health care provider. Document Revised: 01/18/2016 Document Reviewed:  01/11/2016 Elsevier Patient Education  [place].

## 2020-07-04 NOTE — Addendum Note (Signed)
Addended by: Dossie Arbour on: 07/04/2020 05:14 PM   Modules accepted: Orders

## 2020-07-05 ENCOUNTER — Telehealth: Payer: Self-pay | Admitting: Pharmacist

## 2020-07-05 NOTE — Telephone Encounter (Addendum)
Weight loss medication discussed with pt yesterday at [facility] with Dr Shari Prows. Will submit prior authorization for [facility] and follow up with pt once decision is made from insurance company.

## 2020-07-12 MED ORDER — OZEMPIC (0.25 OR 0.5 MG/DOSE) 2 MG/1.5ML ~~LOC~~ SOPN: PEN_INJECTOR | SUBCUTANEOUS | 1 refills | Status: DC

## 2020-07-12 NOTE — Telephone Encounter (Signed)
Called pt's insurance to follow up, they stated Virginia Levine was denied because her benefit plan does not cover weight loss drugs. Asked if they would cover Saxenda instead but they unfortunately do not.

## 2020-07-12 NOTE — Addendum Note (Signed)
Addended by: Aiyonna Lucado E on: 07/12/2020 01:16 PM   Modules accepted: Orders

## 2020-07-12 NOTE — Telephone Encounter (Signed)
Called pharmacy to provide copay card info, pt unfortunately has a very high deductible to meet. Initial copay with her insurance was > $800. After copay card was applied copay was still > $500. Pt understandably does not want to pay this amount. Advised pt we could try again next year to see if her formulary/deductible have changed at all.

## 2020-07-12 NOTE — Telephone Encounter (Signed)
You are the best!  Thank you!

## 2020-07-12 NOTE — Telephone Encounter (Addendum)
Update - pt does have an A1c of 6.6% which places her in the diabetic range. Her insurance should still cover semaglutide (just at lower max dose of 1mg  under Ozempic brand name instead of higher weight loss dose of 3mg  for [facility]). Will try submitting prior authorization for this instead. She is already taking metformin so GLP1RA is appropriate add on therapy for both her DM and obesity.  Ozempic prior auth submitted and approved immediately. Rx sent to pharmacy, left message for pt. Will schedule PharmD visit for counseling and first injection in office. She'll need to be set up with $25/month copay card too.

## 2020-07-18 ENCOUNTER — Ambulatory Visit: Payer: BLUE CROSS/BLUE SHIELD

## 2020-07-19 ENCOUNTER — Telehealth: Payer: Self-pay

## 2020-07-19 NOTE — Telephone Encounter (Signed)
Patient calls nurse line requesting to speak with Dr. [name]. Attempted to gather more information, patient states that it is a personal concern and only wants to speak with provider.   Forwarding to PCP  Veronda Prude, RN

## 2020-07-19 NOTE — Telephone Encounter (Signed)
Called patient. She wanted to update me on her cardiologist's recommendation for Ozempic for weight loss, which insurance will not cover per the pharmacy note, and CT scan to assess for CAD.  She will make appointment with me afterwards.

## 2020-07-19 NOTE — Telephone Encounter (Signed)
Called patient. No answer, left VM to call back if she still has concerns she would like to discuss.

## 2020-07-19 NOTE — Telephone Encounter (Signed)
Patient returns call to nurse line to speak with provider. Patient states that she will have her phone on her for the remainder of the afternoon for returned call.   Veronda Prude, RN

## 2020-07-20 ENCOUNTER — Other Ambulatory Visit: Payer: Self-pay

## 2020-07-20 ENCOUNTER — Other Ambulatory Visit: Payer: Self-pay | Admitting: Family Medicine

## 2020-07-20 DIAGNOSIS — E785 Hyperlipidemia, unspecified: Secondary | ICD-10-CM

## 2020-07-21 MED ORDER — ATORVASTATIN CALCIUM 40 MG PO TABS: 40.0000 mg | ORAL_TABLET | Freq: Every day | ORAL | 1 refills | Status: DC

## 2020-07-28 ENCOUNTER — Other Ambulatory Visit (HOSPITAL_COMMUNITY): Payer: BLUE CROSS/BLUE SHIELD

## 2020-07-28 ENCOUNTER — Telehealth (HOSPITAL_COMMUNITY): Payer: Self-pay | Admitting: Cardiology

## 2020-07-28 ENCOUNTER — Other Ambulatory Visit: Payer: BLUE CROSS/BLUE SHIELD

## 2020-07-28 NOTE — Telephone Encounter (Signed)
Patient called and cancelled echocardiogram and did not wish to reschedule at this time. Order will be removed from the WQ.

## 2020-08-02 ENCOUNTER — Encounter: Payer: Self-pay | Admitting: Family Medicine

## 2020-08-02 ENCOUNTER — Ambulatory Visit (INDEPENDENT_AMBULATORY_CARE_PROVIDER_SITE_OTHER): Payer: BLUE CROSS/BLUE SHIELD | Admitting: Family Medicine

## 2020-08-02 ENCOUNTER — Other Ambulatory Visit: Payer: Self-pay

## 2020-08-02 VITALS — BP 136/86 | HR 84 | Ht 67.0 in | Wt 311.0 lb

## 2020-08-02 DIAGNOSIS — R7303 Prediabetes: Secondary | ICD-10-CM | POA: Diagnosis not present

## 2020-08-02 DIAGNOSIS — I10 Essential (primary) hypertension: Secondary | ICD-10-CM

## 2020-08-02 DIAGNOSIS — Z720 Tobacco use: Secondary | ICD-10-CM

## 2020-08-02 DIAGNOSIS — Z6841 Body Mass Index (BMI) 40.0 and over, adult: Secondary | ICD-10-CM

## 2020-08-02 DIAGNOSIS — G4700 Insomnia, unspecified: Secondary | ICD-10-CM | POA: Diagnosis not present

## 2020-08-02 DIAGNOSIS — R079 Chest pain, unspecified: Secondary | ICD-10-CM | POA: Insufficient documentation

## 2020-08-02 DIAGNOSIS — Z5989 Other problems related to housing and economic circumstances: Secondary | ICD-10-CM | POA: Insufficient documentation

## 2020-08-02 DIAGNOSIS — Z5971 Insufficient health insurance coverage: Secondary | ICD-10-CM | POA: Insufficient documentation

## 2020-08-02 LAB — POCT GLYCOSYLATED HEMOGLOBIN (HGB A1C): Hemoglobin A1C: 6.7 % — AB (ref 4.0–5.6)

## 2020-08-02 MED ORDER — AMLODIPINE BESYLATE 5 MG PO TABS: 5.0000 mg | ORAL_TABLET | Freq: Every day | ORAL | 2 refills | Status: DC

## 2020-08-02 MED ORDER — POTASSIUM CHLORIDE CRYS ER 20 MEQ PO TBCR: 20.0000 meq | EXTENDED_RELEASE_TABLET | Freq: Two times a day (BID) | ORAL | 2 refills | Status: DC

## 2020-08-02 MED ORDER — NORTRIPTYLINE HCL 25 MG PO CAPS: 25.0000 mg | ORAL_CAPSULE | Freq: Every day | ORAL | 1 refills | Status: DC

## 2020-08-02 MED ORDER — CARVEDILOL 25 MG PO TABS: 25.0000 mg | ORAL_TABLET | Freq: Two times a day (BID) | ORAL | 3 refills | Status: DC

## 2020-08-02 MED ORDER — LOSARTAN POTASSIUM 100 MG PO TABS: 100.0000 mg | ORAL_TABLET | Freq: Every day | ORAL | 2 refills | Status: DC

## 2020-08-02 NOTE — Patient Instructions (Signed)
Thank you for coming to see me today. It was a pleasure. Today we talked about:   Call the [facility] to set up an appointment for weight management. Their number is [phone].  [facility] Office in [place] [phone]  Please follow-up with me in 1 month if you are staying with [place].  If you have any questions or concerns, please do not hesitate to call the office at [phone].  Best,   Luis Abed, DO

## 2020-08-02 NOTE — Assessment & Plan Note (Signed)
Now meets criteria for diabetes with A1c of 6.7. Given insurance problem, if she is staying here, she will come back soon to discuss this further.  Otherwise, she will follow up with her new PCP.

## 2020-08-02 NOTE — Assessment & Plan Note (Signed)
Refill provided for nortriptyline.

## 2020-08-02 NOTE — Progress Notes (Signed)
SUBJECTIVE:   CHIEF COMPLAINT / HPI:   Pre-diabetes Last A1c 6.6, but no other criteria for diagnosis of diabetes   Obesity She is very frustrated by her weight She is trying to eat healthy, doesn't drink soda She has gained 10 lbs since last visit  Tobacco abuse Has stopped smoking daily, but only uses when she is very stressed She last smoked on Monday Has been using patches from cardiologist  Hypertension Current regimen: Norvasc 5 mg daily, Coreg 25 mg twice daily, HCTZ 25 mg daily, losartan 100 mg daily She is compliant with this Last BMP on 02/25/2020, another was ordered by cardiology but not yet completed  Chest pain Seen by cardiology on 07/04/10/2022 They ordered coronary CTA and echo She cancelled it because she found out that she has the [facility] Last lipid panel 02/10/2020, LDL 90 at that time, currently on Lipitor 40 mg daily  Insomnia Was using nortriptyline previously with good improvement She is requesting a refill on the  Insurance problems Patient received multiple bills from [facility] system She realizes now that she has [employer] for the [facility] system, not the [employer] system She is trying to figure out her next steps  PERTINENT  PMH / PSH: HTN, OSA, Obesity, HLD, Pre-diabetes, hypokalemia   OBJECTIVE:   BP 136/86   Pulse 84   Ht 5\' 7"  (1.702 m)   Wt (!) 311 lb (141.1 kg)   LMP 11/21/2012   SpO2 96%   BMI 48.71 kg/m    Physical Exam:  General: 55 y.o. female in NAD Lungs: Breathing comfortably on RA Abdomen: Soft, non-tender to palpation, non-distended, positive bowel sounds Skin: warm and dry Extremities: No edema   Results for orders placed or performed in visit on 08/02/20 (from the past 24 hour(s))  POCT glycosylated hemoglobin (Hb A1C)     Status: Abnormal   Collection Time: 08/02/20  4:35 PM  Result Value Ref Range   Hemoglobin A1C 6.7 (A) 4.0 - 5.6 %   HbA1c POC (<> result, manual entry)     HbA1c, POC  (prediabetic range)     HbA1c, POC (controlled diabetic range)       ASSESSMENT/PLAN:   Essential hypertension Well controlled. No changes, will hold off on labs per insurance problem below.  Refills provided.   Pre-diabetes Now meets criteria for diabetes with A1c of 6.7. Given insurance problem, if she is staying here, she will come back soon to discuss this further.  Otherwise, she will follow up with her new PCP.    Insomnia Refill provided for nortriptyline.  Chest pain Continues to have intermittent chest pain.  Unchanged from previous.  Again recommended that patient should have coronary CTA and echo per cardiology recommendations, but patient is trying to figure out her insurance before then.  Tobacco abuse Congratulated on her success.  She is continuing to use patches that were prescribed by her cardiologist.  Prescribed nortriptyline per above for insomnia.  Insurance coverage problems Patient found out after receiving multiple bills from common that she actually has 10/02/20 that is supposed to be within the [facility] system.  She is not able to afford all of these out-of-pocket cost.  She has difficulty with transportation however and gets this from [facility].  Ultimately, she would like to stay within the [facility] system.  She is when to try to call her insurance and see if she can get something worked out.  In  the meantime she was given the phone number for the [facility] practice that is in [place].  She will call them tomorrow as well.  She will follow-up with me to let me know what the plan is moving forward.  Obesity Continues to gain weight and she is very frustrated by this.  [facility], however we will hold off on this given her many insurance problems at the moment.  We will send her there if she stays within the consistent may can get her insurance changed.     Unknown Jim, DO [facility]  [facility]

## 2020-08-02 NOTE — Assessment & Plan Note (Signed)
Continues to have intermittent chest pain.  Unchanged from previous.  Again recommended that patient should have coronary CTA and echo per cardiology recommendations, but patient is trying to figure out her insurance before then.

## 2020-08-02 NOTE — Assessment & Plan Note (Signed)
Well controlled. No changes, will hold off on labs per insurance problem below.  Refills provided.

## 2020-08-02 NOTE — Assessment & Plan Note (Signed)
Continues to gain weight and she is very frustrated by this.  Did discuss [facility], however we will hold off on this given her many insurance problems at the moment.  We will send her there if she stays within the consistent may can get her insurance changed.

## 2020-08-02 NOTE — Assessment & Plan Note (Signed)
Congratulated on her success.  She is continuing to use patches that were prescribed by her cardiologist.  Prescribed nortriptyline per above for insomnia.

## 2020-08-02 NOTE — Assessment & Plan Note (Signed)
Patient found out after receiving multiple bills from common that she actually has [employer] that is supposed to be within the [facility] system.  She is not able to afford all of these out-of-pocket cost.  She has difficulty with transportation however and gets this from [facility].  Ultimately, she would like to stay within the [facility] system.  She is when to try to call her insurance and see if she can get something worked out.  In the meantime she was given the phone number for the [facility] practice that is in [place].  She will call them tomorrow as well.  She will follow-up with me to let me know what the plan is moving forward.

## 2020-08-03 ENCOUNTER — Telehealth: Payer: Self-pay

## 2020-08-03 NOTE — Telephone Encounter (Signed)
Patient calls nurse line requesting to speak with provider. Patient states that provider instructed her to call the office today. Asked patient if there was anything I could help her with and she just wanted me to give this message to PCP.   Veronda Prude, RN

## 2020-08-03 NOTE — Telephone Encounter (Signed)
Called patient.  She found a PCP within the [facility] system and made an appointment with them.  She will be planning to transfer care to them.

## 2020-10-02 ENCOUNTER — Other Ambulatory Visit: Payer: Self-pay | Admitting: Internal Medicine

## 2020-10-02 DIAGNOSIS — E785 Hyperlipidemia, unspecified: Secondary | ICD-10-CM

## 2020-10-03 ENCOUNTER — Other Ambulatory Visit: Payer: Self-pay | Admitting: Family Medicine

## 2020-10-27 ENCOUNTER — Ambulatory Visit: Payer: BLUE CROSS/BLUE SHIELD | Admitting: Cardiology

## 2020-11-29 ENCOUNTER — Inpatient Hospital Stay: Admission: RE | Admit: 2020-11-29 | Payer: BLUE CROSS/BLUE SHIELD | Source: Ambulatory Visit

## 2021-01-02 ENCOUNTER — Other Ambulatory Visit: Payer: Self-pay | Admitting: Family Medicine

## 2021-01-02 DIAGNOSIS — I10 Essential (primary) hypertension: Secondary | ICD-10-CM

## 2021-01-26 ENCOUNTER — Other Ambulatory Visit: Payer: Self-pay | Admitting: Family Medicine

## 2021-01-26 DIAGNOSIS — I10 Essential (primary) hypertension: Secondary | ICD-10-CM

## 2021-02-26 ENCOUNTER — Other Ambulatory Visit: Payer: Self-pay | Admitting: Family Medicine

## 2021-02-26 DIAGNOSIS — R7303 Prediabetes: Secondary | ICD-10-CM

## 2021-05-10 ENCOUNTER — Other Ambulatory Visit: Payer: Self-pay | Admitting: Family Medicine

## 2021-05-10 DIAGNOSIS — I10 Essential (primary) hypertension: Secondary | ICD-10-CM

## 2021-06-06 ENCOUNTER — Other Ambulatory Visit: Payer: Self-pay

## 2021-06-06 ENCOUNTER — Encounter (HOSPITAL_COMMUNITY): Payer: Self-pay

## 2021-06-06 ENCOUNTER — Ambulatory Visit (INDEPENDENT_AMBULATORY_CARE_PROVIDER_SITE_OTHER): Payer: Self-pay

## 2021-06-06 ENCOUNTER — Ambulatory Visit (HOSPITAL_COMMUNITY)
Admission: RE | Admit: 2021-06-06 | Discharge: 2021-06-06 | Disposition: A | Discharge status: Home / Self Care | Payer: Self-pay | Source: Ambulatory Visit | Attending: Physician Assistant | Admitting: Physician Assistant

## 2021-06-06 VITALS — BP 151/99 | HR 83 | Temp 98.9°F | Resp 18

## 2021-06-06 DIAGNOSIS — J441 Chronic obstructive pulmonary disease with (acute) exacerbation: Secondary | ICD-10-CM | POA: Insufficient documentation

## 2021-06-06 DIAGNOSIS — R051 Acute cough: Secondary | ICD-10-CM | POA: Insufficient documentation

## 2021-06-06 DIAGNOSIS — Z20822 Contact with and (suspected) exposure to covid-19: Secondary | ICD-10-CM | POA: Insufficient documentation

## 2021-06-06 DIAGNOSIS — R0602 Shortness of breath: Secondary | ICD-10-CM | POA: Insufficient documentation

## 2021-06-06 DIAGNOSIS — F17210 Nicotine dependence, cigarettes, uncomplicated: Secondary | ICD-10-CM | POA: Insufficient documentation

## 2021-06-06 DIAGNOSIS — I119 Hypertensive heart disease without heart failure: Secondary | ICD-10-CM | POA: Insufficient documentation

## 2021-06-06 DIAGNOSIS — Z8701 Personal history of pneumonia (recurrent): Secondary | ICD-10-CM | POA: Insufficient documentation

## 2021-06-06 DIAGNOSIS — E119 Type 2 diabetes mellitus without complications: Secondary | ICD-10-CM | POA: Insufficient documentation

## 2021-06-06 LAB — CBC WITH DIFFERENTIAL/PLATELET
Abs Immature Granulocytes: 0.04 10*3/uL (ref 0.00–0.07)
Basophils Absolute: 0 10*3/uL (ref 0.0–0.1)
Basophils Relative: 0 %
Eosinophils Absolute: 0.1 10*3/uL (ref 0.0–0.5)
Eosinophils Relative: 1 %
HCT: 41.2 % (ref 36.0–46.0)
Hemoglobin: 13.7 g/dL (ref 12.0–15.0)
Immature Granulocytes: 0 %
Lymphocytes Relative: 19 %
Lymphs Abs: 1.8 10*3/uL (ref 0.7–4.0)
MCH: 32.9 pg (ref 26.0–34.0)
MCHC: 33.3 g/dL (ref 30.0–36.0)
MCV: 98.8 fL (ref 80.0–100.0)
Monocytes Absolute: 0.5 10*3/uL (ref 0.1–1.0)
Monocytes Relative: 5 %
Neutro Abs: 7 10*3/uL (ref 1.7–7.7)
Neutrophils Relative %: 75 %
Platelets: 212 10*3/uL (ref 150–400)
RBC: 4.17 MIL/uL (ref 3.87–5.11)
RDW: 12.5 % (ref 11.5–15.5)
WBC: 9.4 10*3/uL (ref 4.0–10.5)
nRBC: 0 % (ref 0.0–0.2)

## 2021-06-06 LAB — BASIC METABOLIC PANEL
Anion gap: 8 (ref 5–15)
BUN: 11 mg/dL (ref 6–20)
CO2: 28 mmol/L (ref 22–32)
Calcium: 9.7 mg/dL (ref 8.9–10.3)
Chloride: 105 mmol/L (ref 98–111)
Creatinine, Ser: 0.7 mg/dL (ref 0.44–1.00)
GFR, Estimated: >60 mL/min (ref >60)
Glucose, Bld: 101 mg/dL — ABNORMAL HIGH (ref 70–99)
Potassium: 3.3 mmol/L — ABNORMAL LOW (ref 3.5–5.1)
Sodium: 141 mmol/L (ref 135–145)

## 2021-06-06 LAB — POC INFLUENZA A AND B ANTIGEN (URGENT CARE ONLY)
INFLUENZA A ANTIGEN, POC: NEGATIVE
INFLUENZA B ANTIGEN, POC: NEGATIVE

## 2021-06-06 MED ORDER — DOXYCYCLINE HYCLATE 100 MG PO CAPS: 100.0000 mg | ORAL_CAPSULE | Freq: Two times a day (BID) | ORAL | 0 refills | Status: DC

## 2021-06-06 MED ORDER — ALBUTEROL SULFATE (2.5 MG/3ML) 0.083% IN NEBU
INHALATION_SOLUTION | RESPIRATORY_TRACT | Status: AC
  Filled 2021-06-06: qty 3

## 2021-06-06 MED ORDER — ALBUTEROL SULFATE HFA 108 (90 BASE) MCG/ACT IN AERS: 1.0000 | INHALATION_SPRAY | Freq: Four times a day (QID) | RESPIRATORY_TRACT | 0 refills | Status: DC | PRN

## 2021-06-06 MED ORDER — METHYLPREDNISOLONE SODIUM SUCC 125 MG IJ SOLR
80.0000 mg | Freq: Once | INTRAMUSCULAR | Status: AC
  Administered 2021-06-06: 80 mg via INTRAMUSCULAR

## 2021-06-06 MED ORDER — METHYLPREDNISOLONE SODIUM SUCC 125 MG IJ SOLR
INTRAMUSCULAR | Status: AC
  Filled 2021-06-06: qty 2

## 2021-06-06 MED ORDER — BUDESONIDE-FORMOTEROL FUMARATE 80-4.5 MCG/ACT IN AERO: 2.0000 | INHALATION_SPRAY | Freq: Two times a day (BID) | RESPIRATORY_TRACT | 0 refills | Status: DC

## 2021-06-06 MED ORDER — ALBUTEROL SULFATE (2.5 MG/3ML) 0.083% IN NEBU
2.5000 mg | INHALATION_SOLUTION | Freq: Once | RESPIRATORY_TRACT | Status: AC
  Administered 2021-06-06: 2.5 mg via RESPIRATORY_TRACT

## 2021-06-06 NOTE — ED Provider Notes (Addendum)
[facility]    CSN: [id number] Arrival date & time: 06/06/21  1320      History   Chief Complaint Chief Complaint  Patient presents with   Cough    HPI Virginia Levine is a 56 y.o. female.   Patient presents today with a 3 to 4-day history of URI symptoms including cough, congestion, shortness of breath.  He reports that symptoms have worsened and she is now having significant shortness of breath prompting evaluation today.  She was evaluated by health at work and had a negative COVID test but since that time she has had worsening symptoms including development of fever.  Denies any chest pain, nausea, vomiting, sore throat, body aches.  Denies any known sick contacts but does work in [employer], [employer].  She is up-to-date on COVID and influenza vaccines.  Has not had COVID in the past.  She does have a history of diabetes but denies history of immunosuppression or chronic lung disease such as asthma or COPD.  She is a current everyday smoker but has decreased how much she is smoking due to illness.  She does have a history of pneumonia and reports current symptoms are similar to previous episodes of this condition.  Denies any recent antibiotic use.   Past Medical History:  Diagnosis Date   Hyperlipidemia    Hypertension    Obesity    OSA (obstructive sleep apnea)    Pre-diabetes    Tobacco use     Patient Active Problem List   Diagnosis Date Noted   Insurance coverage problems 08/02/2020   Chest pain 08/02/2020   Insomnia 04/13/2020   Flat foot 04/13/2020   Hypokalemia 03/23/2020   Urinary incontinence 03/16/2020   Weight gain 02/11/2020   Pre-diabetes 12/15/2019   OSA (obstructive sleep apnea) 12/15/2019   Screen for colon cancer 12/15/2019   Obesity 01/21/2013   Dyslipidemia 01/21/2013   Tobacco abuse 01/21/2013   Essential hypertension 12/11/2012    Past Surgical History:  Procedure Laterality Date   LEEP  2018    OB History     Gravida  2   Para  0    Term  0   Preterm  0   AB  2   Living  0      SAB  2   IAB  0   Ectopic      Multiple      Live Births               Home Medications    Prior to Admission medications   Medication Sig Start Date End Date Taking? Authorizing Provider  albuterol (VENTOLIN HFA) 108 (90 Base) MCG/ACT inhaler Inhale 1-2 puffs into the lungs every 6 (six) hours as needed for wheezing or shortness of breath. 06/06/21  Yes Jermisha Hoffart, Denny PeonErin K, PA-C  budesonide-formoterol (SYMBICORT) 80-4.5 MCG/ACT inhaler Inhale 2 puffs into the lungs in the morning and at bedtime. 06/06/21  Yes Zyliah Schier K, PA-C  doxycycline (VIBRAMYCIN) 100 MG capsule Take 1 capsule (100 mg total) by mouth 2 (two) times daily. 06/06/21  Yes Brier Firebaugh K, PA-C  amLODipine (NORVASC) 5 MG tablet Take 1 tablet (5 mg total) by mouth daily. 08/02/20   Meccariello, Solmon IceBailey J, DO  aspirin 81 MG EC tablet Take 1 tablet by mouth daily. 11/27/18   [provider]  atorvastatin (LIPITOR) 40 MG tablet Take 1 tablet (40 mg total) by mouth daily. 07/21/20   Meccariello, Solmon IceBailey J, DO  carvedilol (COREG) 25  MG tablet TAKE 1 TABLET BY MOUTH TWICE DAILY WITH MEALS 01/04/21   Maury Dus, MD  ciclopirox [facility]) 8 % solution Apply topically at bedtime. Apply over nail and surrounding skin. Apply daily over previous coat. After seven (7) days, may remove with alcohol and continue cycle. 06/08/20   Edwin Cap, DPM  hydrochlorothiazide (HYDRODIURIL) 25 MG tablet Take 1 tablet by mouth once daily 10/03/20   Meccariello, Solmon Ice, DO  losartan (COZAAR) 100 MG tablet Take 1 tablet by mouth once daily 05/10/21   Maury Dus, MD  metFORMIN (GLUCOPHAGE-XR) 500 MG 24 hr tablet Take 1 tablet by mouth once daily with breakfast 02/27/21   Maury Dus, MD  metoprolol tartrate (LOPRESSOR) 100 MG tablet Take one tablet by mouth 2 hours prior to CT Scan 07/04/20   Meriam Sprague, MD  nortriptyline (PAMELOR) 25 MG capsule Take 1 capsule (25 mg total)  by mouth at bedtime. 08/02/20   Meccariello, Solmon Ice, DO  potassium chloride SA (KLOR-CON) 20 MEQ tablet Take 1 tablet by mouth twice daily 01/29/21   Maury Dus, MD  simethicone (MYLICON) 80 MG chewable tablet Chew 1 tablet (80 mg total) by mouth 3 (three) times daily as needed. 07/04/20   Meriam Sprague, MD    Family History Family History  Problem Relation Age of Onset   Heart attack Father        died at age 71   Leukemia Mother 58   Diabetes Mother    Drug abuse Sister    Aneurysm Sister    Kidney failure Brother    Hypertension Brother    Aneurysm Sister    Diabetes Maternal Grandmother     Social History Social History   Tobacco Use   Smoking status: Light Smoker    Packs/day: 0.15    Types: Cigarettes   Smokeless tobacco: Never   Tobacco comments:    smokes with drinking more (once a month), 5-6 cigarettes a day  Vaping Use   Vaping Use: Never used  Substance Use Topics   Alcohol use: Yes    Comment: occasional   Drug use: No     Allergies   Lisinopril   Review of Systems Review of Systems  Constitutional:  Positive for activity change, fatigue and fever. Negative for appetite change.  HENT:  Positive for congestion. Negative for sinus pressure, sneezing and sore throat.   Respiratory:  Positive for cough and shortness of breath.   Cardiovascular:  Negative for chest pain.  Gastrointestinal:  Negative for abdominal pain, diarrhea, nausea and vomiting.  Musculoskeletal:  Negative for arthralgias and myalgias.  Neurological:  Positive for headaches. Negative for dizziness and light-headedness.    Physical Exam Triage Vital Signs ED Triage Vitals  Enc Vitals Group     BP 06/06/21 1413 (!) 151/99     Pulse Rate 06/06/21 1413 83     Resp 06/06/21 1413 18     Temp 06/06/21 1413 98.9 F (37.2 C)     Temp Source 06/06/21 1413 Oral     SpO2 06/06/21 1413 90 %     Weight --      Height --      Head Circumference --      Peak Flow --      Pain  Score 06/06/21 1416 0     Pain Loc --      Pain Edu? --      Excl. in [place]? --    No data found.  Updated Vital Signs BP (!) 151/99 (BP Location: Right Arm)    Pulse 83    Temp 98.9 F (37.2 C) (Oral)    Resp 18    LMP 11/21/2012    SpO2 92%   Visual Acuity Right Eye Distance:   Left Eye Distance:   Bilateral Distance:    Right Eye Near:   Left Eye Near:    Bilateral Near:     Physical Exam Vitals reviewed.  Constitutional:      General: She is awake. She is not in acute distress.    Appearance: Normal appearance. She is well-developed. She is not ill-appearing.     Comments: Very pleasant female appears stated age in no acute distress sitting comfortably in exam room  HENT:     Head: Normocephalic and atraumatic.     Right Ear: External ear normal.     Left Ear: External ear normal.     Nose:     Right Sinus: No maxillary sinus tenderness or frontal sinus tenderness.     Left Sinus: No maxillary sinus tenderness or frontal sinus tenderness.     Mouth/Throat:     Pharynx: Uvula midline. No oropharyngeal exudate or posterior oropharyngeal erythema.  Cardiovascular:     Rate and Rhythm: Normal rate and regular rhythm.     Heart sounds: Normal heart sounds, S1 normal and S2 normal. No murmur heard. Pulmonary:     Effort: Pulmonary effort is normal.     Breath sounds: Wheezing and rhonchi present. No rales.  Psychiatric:        Behavior: Behavior is cooperative.     UC Treatments / Results  Labs (all labs ordered are listed, but only abnormal results are displayed) Labs Reviewed  SARS CORONAVIRUS 2 (TAT 6-24 HRS)  CBC WITH DIFFERENTIAL/PLATELET  BASIC METABOLIC PANEL  POC INFLUENZA A AND B ANTIGEN (URGENT CARE ONLY)    EKG   Radiology DG Chest 2 View  Result Date: 06/06/2021 CLINICAL DATA:  Shortness of breath EXAM: CHEST - 2 VIEW COMPARISON:  None. FINDINGS: Transverse diameter of heart is increased. Central pulmonary vessels are prominent. There are no signs  of alveolar pulmonary edema or focal pulmonary consolidation. Small transverse linear densities seen in the left mid lung fields. There is no pleural effusion or pneumothorax. IMPRESSION: Cardiomegaly. Central pulmonary vessels are prominent without signs of alveolar pulmonary edema. There is no focal pulmonary consolidation. Small transverse linear density in the left parahilar region may suggest scarring or subsegmental atelectasis. Electronically Signed   By: Ernie Avena M.D.   On: 06/06/2021 14:43    Procedures Procedures (including critical care time)  Medications Ordered in UC Medications  albuterol (PROVENTIL) (2.5 MG/3ML) 0.083% nebulizer solution 2.5 mg (2.5 mg Nebulization Given 06/06/21 1513)  methylPREDNISolone sodium succinate (SOLU-MEDROL) 125 mg/2 mL injection 80 mg (80 mg Intramuscular Given 06/06/21 1513)    Initial Impression / Assessment and Plan / UC Course  I have reviewed the triage vital signs and the nursing notes.  Pertinent labs & imaging results that were available during my care of the patient were reviewed by me and considered in my medical decision making (see chart for details).     Patient had significant improvement of symptoms with albuterol treatment and Solu-Medrol in clinic.  Oxygen improved to 94%.  Flu testing was negative.  COVID test is pending.  Given her history of diabetes she would be a candidate for antiviral medication if COVID is pending.  Will recommend molnupiravir due  to multiple medication interactions with Paxlovid.  CBC was obtained that was normal.  BMP obtained-results pending.  Discussed potential utility of starting prednisone taper but patient had concerns about this given history of diabetes and weight gain so instead we will use inhaled corticosteroid/LABA combination (Symbicort).  She was instructed to rinse her mouth following use of this medication to prevent thrush.  She was given albuterol inhaler with instruction uses every 4-6  hours as needed with shortness of breath symptoms and instructed how to use this medication properly during visit.  Given worsening symptoms will cover with doxycycline 100 mg twice daily.  Chest x-ray was obtained that showed no focal pulmonary consolidation but did show cardiomegaly as well as prominent central vessels; discussed with patient and recommended follow-up with PCP to consider additional testing and possible specialist referral.  We will try to establish with PCP via PCP assistance.  She is to rest and drink plenty of fluid.  Discussed at length that if she has any worsening symptoms including increased shortness of breath, worsening cough, nausea vomiting interfering with oral intake she needs to go to the emergency room.  Recommend she follow-up with either our clinic or PCP within 48 hours.  Strict return precautions given to which she expressed understanding.  She was provided work excuse note with current CDC return to work guidelines based on test result.  Final Clinical Impressions(s) / UC Diagnoses   Final diagnoses:  Acute cough  Shortness of breath  COPD exacerbation (HCC)     Discharge Instructions      You were negative for flu.  Your chest x-ray did not show any evidence of pneumonia but given your worsening symptoms we are going to start you on doxycycline 100 mg twice daily for 10 days.  Use albuterol every 4-6 hours as needed for shortness of breath.  Start Symbicort twice daily.  Make sure to rinse her mouth and spit out the water following use of this medication.  We will try to find you a primary care provider but if you cannot see someone within a few days please return here for reevaluation.  If you develop any worsening shortness of breath you need to go to the emergency room as we discussed.     ED Prescriptions     Medication Sig Dispense Auth. Provider   albuterol (VENTOLIN HFA) 108 (90 Base) MCG/ACT inhaler Inhale 1-2 puffs into the lungs every 6 (six)  hours as needed for wheezing or shortness of breath. 8 g Berwyn Bigley K, PA-C   budesonide-formoterol (SYMBICORT) 80-4.5 MCG/ACT inhaler Inhale 2 puffs into the lungs in the morning and at bedtime. 1 each Lemma Tetro K, PA-C   doxycycline (VIBRAMYCIN) 100 MG capsule Take 1 capsule (100 mg total) by mouth 2 (two) times daily. 20 capsule Mariell Nester, Noberto RetortErin K, PA-C      PDMP not reviewed this encounter.   Jeani HawkingRaspet, Rylan Kaufmann K, PA-C 06/06/21 1546    Kyani Simkin, Noberto RetortErin K, PA-C 06/06/21 1546

## 2021-06-06 NOTE — Discharge Instructions (Addendum)
You were negative for flu.  Your chest x-ray did not show any evidence of pneumonia but given your worsening symptoms we are going to start you on doxycycline 100 mg twice daily for 10 days.  Use albuterol every 4-6 hours as needed for shortness of breath.  Start Symbicort twice daily.  Make sure to rinse her mouth and spit out the water following use of this medication.  We will try to find you a primary care provider but if you cannot see someone within a few days please return here for reevaluation.  If you develop any worsening shortness of breath you need to go to the emergency room as we discussed.

## 2021-06-06 NOTE — ED Triage Notes (Signed)
Pt c/o cough, congestion, and SOB since Sunday and had a neg covid test through health at work. States developed a fever after testing so sent back for another covid test.

## 2021-06-07 LAB — SARS CORONAVIRUS 2 (TAT 6-24 HRS): SARS Coronavirus 2: NEGATIVE

## 2021-06-13 ENCOUNTER — Ambulatory Visit (HOSPITAL_COMMUNITY): Payer: Self-pay

## 2021-06-21 ENCOUNTER — Encounter (HOSPITAL_COMMUNITY): Payer: Self-pay

## 2021-06-21 ENCOUNTER — Telehealth: Payer: Self-pay

## 2021-06-21 ENCOUNTER — Ambulatory Visit (HOSPITAL_COMMUNITY)
Admission: RE | Admit: 2021-06-21 | Discharge: 2021-06-21 | Disposition: A | Discharge status: Home / Self Care | Payer: No Typology Code available for payment source | Source: Ambulatory Visit | Attending: Emergency Medicine | Admitting: Emergency Medicine

## 2021-06-21 ENCOUNTER — Other Ambulatory Visit: Payer: Self-pay

## 2021-06-21 ENCOUNTER — Telehealth: Payer: Self-pay | Admitting: Cardiology

## 2021-06-21 ENCOUNTER — Other Ambulatory Visit (HOSPITAL_COMMUNITY): Payer: Self-pay

## 2021-06-21 VITALS — BP 133/80 | HR 78 | Temp 98.9°F | Resp 20

## 2021-06-21 DIAGNOSIS — M545 Low back pain, unspecified: Secondary | ICD-10-CM

## 2021-06-21 DIAGNOSIS — I10 Essential (primary) hypertension: Secondary | ICD-10-CM | POA: Diagnosis not present

## 2021-06-21 MED ORDER — CARVEDILOL 25 MG PO TABS
25.0000 mg | ORAL_TABLET | Freq: Two times a day (BID) | ORAL | 0 refills | Status: DC
  Filled 2021-06-21 – 2021-06-29 (×2): qty 180, 90d supply, fill #0

## 2021-06-21 MED ORDER — POTASSIUM CHLORIDE CRYS ER 20 MEQ PO TBCR
20.0000 meq | EXTENDED_RELEASE_TABLET | Freq: Two times a day (BID) | ORAL | 0 refills | Status: DC
  Filled 2021-06-21 – 2021-06-29 (×2): qty 60, 30d supply, fill #0

## 2021-06-21 NOTE — ED Provider Notes (Signed)
[place]    CSN: [id number] Arrival date & time: 06/21/21  [id number]      History   Chief Complaint Chief Complaint  Patient presents with   appt 9 - back pain    HPI Virginia Levine is a 56 y.o. female.  Patient here with chronic back pain.  Has been taking ibuprofen for for it with some relief of pain.  Pain comes and goes, is not constant.  Denies numbness, weakness, pain radiating anywhere, loss of bladder or bowel control.  Pain is in thoracic and lumbar areas.  Sometimes central, sometimes more lateral.  Today pain is mostly in R lower back.  Reports she has had imaging in the past, cannot remember where, and knows she has some problems in her spine.  Also wants refills of carvedilol and potassium.  Initially states she does not have a PCP, but did have primary care through [facility] through last March -last office visit 08/02/2020.  Patient saw cardiologist last February who ordered coronary CT and echocardiogram which patient did not get.  Patient has not followed up with cardiology.  HPI  Past Medical History:  Diagnosis Date   Hyperlipidemia    Hypertension    Obesity    OSA (obstructive sleep apnea)    Pre-diabetes    Tobacco use     Patient Active Problem List   Diagnosis Date Noted   Insurance coverage problems 08/02/2020   Chest pain 08/02/2020   Insomnia 04/13/2020   Flat foot 04/13/2020   Hypokalemia 03/23/2020   Urinary incontinence 03/16/2020   Weight gain 02/11/2020   Pre-diabetes 12/15/2019   OSA (obstructive sleep apnea) 12/15/2019   Screen for colon cancer 12/15/2019   Obesity 01/21/2013   Dyslipidemia 01/21/2013   Tobacco abuse 01/21/2013   Essential hypertension 12/11/2012    Past Surgical History:  Procedure Laterality Date   LEEP  2018    OB History     Gravida  2   Para  0   Term  0   Preterm  0   AB  2   Living  0      SAB  2   IAB  0   Ectopic      Multiple      Live Births                Home Medications    Prior to Admission medications   Medication Sig Start Date End Date Taking? Authorizing Provider  albuterol (VENTOLIN HFA) 108 (90 Base) MCG/ACT inhaler Inhale 1-2 puffs into the lungs every 6 (six) hours as needed for wheezing or shortness of breath. 06/06/21   Raspet, Junie Panning K, PA-C  amLODipine (NORVASC) 5 MG tablet Take 1 tablet (5 mg total) by mouth daily. 08/02/20   Meccariello, Bernita Raisin, DO  aspirin 81 MG EC tablet Take 1 tablet by mouth daily. 11/27/18   [provider]  atorvastatin (LIPITOR) 40 MG tablet Take 1 tablet (40 mg total) by mouth daily. 07/21/20   Meccariello, Bernita Raisin, DO  budesonide-formoterol (SYMBICORT) 80-4.5 MCG/ACT inhaler Inhale 2 puffs into the lungs in the morning and at bedtime. 06/06/21   Raspet, Derry Skill, PA-C  carvedilol (COREG) 25 MG tablet Take 1 tablet (25 mg total) by mouth 2 (two) times daily with a meal. 06/21/21   Carvel Getting, NP  ciclopirox (PENLAC) 8 % solution Apply topically at bedtime. Apply over nail and surrounding skin. Apply daily over previous coat. After seven (7)  days, may remove with alcohol and continue cycle. 06/08/20   McDonald, Stephan Minister, DPM  doxycycline (VIBRAMYCIN) 100 MG capsule Take 1 capsule (100 mg total) by mouth 2 (two) times daily. 06/06/21   Raspet, Derry Skill, PA-C  hydrochlorothiazide (HYDRODIURIL) 25 MG tablet Take 1 tablet by mouth once daily 10/03/20   Meccariello, Bernita Raisin, DO  losartan (COZAAR) 100 MG tablet Take 1 tablet by mouth once daily 05/10/21   Alcus Dad, MD  metFORMIN (GLUCOPHAGE-XR) 500 MG 24 hr tablet Take 1 tablet by mouth once daily with breakfast 02/27/21   Alcus Dad, MD  metoprolol tartrate (LOPRESSOR) 100 MG tablet Take one tablet by mouth 2 hours prior to CT Scan 07/04/20   Freada Bergeron, MD  nortriptyline (PAMELOR) 25 MG capsule Take 1 capsule (25 mg total) by mouth at bedtime. 08/02/20   Meccariello, Bernita Raisin, DO  potassium chloride SA (KLOR-CON M) 20 MEQ tablet Take 1 tablet  (20 mEq total) by mouth 2 (two) times daily. 06/21/21   Carvel Getting, NP  simethicone (MYLICON) 80 MG chewable tablet Chew 1 tablet (80 mg total) by mouth 3 (three) times daily as needed. 07/04/20   Freada Bergeron, MD    Family History Family History  Problem Relation Age of Onset   Heart attack Father        died at age 34   Leukemia Mother 58   Diabetes Mother    Drug abuse Sister    Aneurysm Sister    Kidney failure Brother    Hypertension Brother    Aneurysm Sister    Diabetes Maternal Grandmother     Social History Social History   Tobacco Use   Smoking status: Light Smoker    Packs/day: 0.15    Types: Cigarettes   Smokeless tobacco: Never   Tobacco comments:    smokes with drinking more (once a month), 5-6 cigarettes a day  Vaping Use   Vaping Use: Never used  Substance Use Topics   Alcohol use: Yes    Comment: occasional   Drug use: No     Allergies   Lisinopril   Review of Systems Review of Systems  Respiratory:  Negative for chest tightness and shortness of breath.   Cardiovascular:  Negative for chest pain, palpitations and leg swelling.  Musculoskeletal:  Positive for back pain. Negative for neck pain.  Neurological:  Negative for dizziness, weakness and numbness.    Physical Exam Triage Vital Signs ED Triage Vitals  Enc Vitals Group     BP 06/21/21 0918 133/80     Pulse Rate 06/21/21 0918 78     Resp 06/21/21 0918 20     Temp 06/21/21 0918 98.9 F (37.2 C)     Temp Source 06/21/21 0918 Oral     SpO2 06/21/21 0918 99 %     Weight --      Height --      Head Circumference --      Peak Flow --      Pain Score 06/21/21 0920 6     Pain Loc --      Pain Edu? --      Excl. in [place]? --    No data found.  Updated Vital Signs BP 133/80 (BP Location: Right Arm)    Pulse 78    Temp 98.9 F (37.2 C) (Oral)    Resp 20    LMP 11/21/2012    SpO2 99%   Visual Acuity Right Eye Distance:  Left Eye Distance:   Bilateral Distance:    Right  Eye Near:   Left Eye Near:    Bilateral Near:     Physical Exam Constitutional:      General: She is not in acute distress.    Appearance: Normal appearance. She is not ill-appearing.  Cardiovascular:     Rate and Rhythm: Normal rate and regular rhythm.  Pulmonary:     Effort: Pulmonary effort is normal.     Breath sounds: Normal breath sounds.  Musculoskeletal:     Cervical back: No tenderness or bony tenderness.     Thoracic back: No tenderness or bony tenderness.     Lumbar back: Tenderness present. No bony tenderness.       Back:     Right lower leg: No edema.     Left lower leg: No edema.  Neurological:     Mental Status: She is alert.     UC Treatments / Results  Labs (all labs ordered are listed, but only abnormal results are displayed) Labs Reviewed - No data to display  EKG   Radiology No results found.  Procedures Procedures (including critical care time)  Medications Ordered in UC Medications - No data to display  Initial Impression / Assessment and Plan / UC Course  I have reviewed the triage vital signs and the nursing notes.  Pertinent labs & imaging results that were available during my care of the patient were reviewed by me and considered in my medical decision making (see chart for details).    Patient to follow-up with Dr. Johney Frame and get cardiac testing as ordered.  Patient to follow-up with family medicine clinic and get reestablished for primary care.  Patient to follow-up with sports medicine for low back pain.  Final Clinical Impressions(s) / UC Diagnoses   Final diagnoses:  Primary hypertension  Acute right-sided low back pain without sciatica     Discharge Instructions      Call cardiology office to get coronary CT scan and echocardiogram scheduled. Also get follow up appointment with Dr. Johney Frame.   Call Family Medicine for an appointment   Call sports medicine for an appointment about your back pain     ED  Prescriptions     Medication Sig Dispense Auth. Provider   carvedilol (COREG) 25 MG tablet Take 1 tablet (25 mg total) by mouth 2 (two) times daily with a meal. 180 tablet Nilda Keathley, Dionne Bucy, NP   potassium chloride SA (KLOR-CON M) 20 MEQ tablet Take 1 tablet (20 mEq total) by mouth 2 (two) times daily. 60 tablet Carvel Getting, NP      PDMP not reviewed this encounter.   Carvel Getting, NP 06/21/21 1021

## 2021-06-21 NOTE — Telephone Encounter (Signed)
Pt is calling to make an appt with Dr. Shari Prows or an Extender in the next month, as Urgent Care advised her to do.  Pt states she saw Urgent Care 2 weeks ago for the flu, and went back into see them today for follow-up of that.  Pt states the NP at Urgent Care advised her to schedule an appt with her Cardiologist for this month or next, for they told her she has a slightly enlarged heart. Appears findings were noted on chest x-ray [facility] did on the pt on 06/06/21.  Per Urgent Care note from today, they wanted her to reach out to [place] and schedule an appt, for she was last seen back in Feb 2022 and was ordered a Cardiac CT and echo at that time, which pt did not follow through in getting done.  Pt has a strict work schedule and works for the [employer], so she provided me specific days in which she could attend an appt with our office.  Was able to schedule the pt to see Tereso Newcomer PA-C on 07/10/21 at 2:45 pm.  She is aware to arrive 15 mins prior to that visit.  Pt states she is asymptomatic from a cardiac perspective and recovering from the flu, with symptoms improving.  Advised the pt if she develops any cardiac symptoms between now and her appt, to reach out to [place] and let us know.  Pt verbalized understanding and agrees with this plan.

## 2021-06-21 NOTE — Discharge Instructions (Addendum)
Call cardiology office to get coronary CT scan and echocardiogram scheduled. Also get follow up appointment with Dr. Shari Prows.   Call Family Medicine for an appointment   Call sports medicine for an appointment about your back pain

## 2021-06-21 NOTE — Telephone Encounter (Signed)
Patient called stating that she was seen today at urgent care.  They told her to follow up with [place] as her heart is slightly enlarged.  First available I found was in April, patient does not want to wait that long she would like something sooner.

## 2021-06-21 NOTE — ED Triage Notes (Signed)
Pt c/o chronic back pain for months. Denies new injury. Taking ibuprofen for the past 2 months. Requesting med refills for Symbicort, carvedilol, and potassium.

## 2021-06-25 ENCOUNTER — Other Ambulatory Visit: Payer: Self-pay

## 2021-06-25 ENCOUNTER — Encounter: Payer: Self-pay | Admitting: Podiatry

## 2021-06-25 ENCOUNTER — Ambulatory Visit (INDEPENDENT_AMBULATORY_CARE_PROVIDER_SITE_OTHER): Payer: No Typology Code available for payment source | Admitting: Podiatry

## 2021-06-25 DIAGNOSIS — M79675 Pain in left toe(s): Secondary | ICD-10-CM | POA: Diagnosis not present

## 2021-06-25 DIAGNOSIS — M79674 Pain in right toe(s): Secondary | ICD-10-CM

## 2021-06-25 DIAGNOSIS — B351 Tinea unguium: Secondary | ICD-10-CM

## 2021-06-27 NOTE — Progress Notes (Signed)
Subjective:   Patient ID: Virginia Levine, female   DOB: 56 y.o.   MRN: [id number]   HPI Patient presents with severe nail disease 1-5 both feet that are thickened dystrophic and possible for her to cut getting worse and she knows long-term she needs to have removed   ROS      Objective:  Physical Exam  Neurovascular status intact with severely thickened dystrophic nailbeds 1-5 both feet that do get sore and make wearing shoe gear and possible     Assessment:  Severe chronic nail disease 1-5 both feet dystrophic painful     Plan:  H&P reviewed condition recommended debridement today which was accomplished of long-term nail removal that she wants to but I like her to have a couple days off. She is going to schedule for removal of 5 at a time

## 2021-06-29 ENCOUNTER — Other Ambulatory Visit: Payer: Self-pay | Admitting: Cardiology

## 2021-06-29 ENCOUNTER — Other Ambulatory Visit (HOSPITAL_COMMUNITY): Payer: Self-pay

## 2021-06-29 DIAGNOSIS — R072 Precordial pain: Secondary | ICD-10-CM

## 2021-07-04 DIAGNOSIS — Z0000 Encounter for general adult medical examination without abnormal findings: Secondary | ICD-10-CM | POA: Insufficient documentation

## 2021-07-04 NOTE — Progress Notes (Signed)
SUBJECTIVE:   CHIEF COMPLAINT / HPI:   Virginia Levine 56 y.o. presents to reestablish care with a new PCP.  She brings concerns today of shortness of breath on exertion and would additionally like to discuss quitting smoking and assistance with losing weight.  Additionally, she would like to discuss her current medications.  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 She gets winded when she is mopping the floor or cleaning down rooms. States if she walked up a flight of stairs she would out of breath. She leans over and will be able to recover within usually 10 seconds. Denies any chest pain during these episodes. She does endorse a "little pain" in the center of her chest when she sleeps that comes quickly and then resolves. Denies palpitations. She does not get short of breath at home. Denies lower extremity edema. Her father died of a heart attack at the age of 56. Her older sister also died of a heart attack but "she was on drugs".   Weight loss: states she has not really tried anything except diet changes.  She has gone from 311 pounds to 289 pounds since March 2022.  Smoking cessation: she has tried the patch in the past. She states the nicotine craving is very intense.  She has smoked 0.5 packs/day for 40 years.  Type 2 diabetes: She stopped taking her metformin 2 months ago due to hearing that it can damage your liver.  Healthcare maintenance: she has had a colonoscopy about 6-7 years ago.  Last Pap on 02/09/2020 showed ASCUS with advised to repeat and cotesting in 1 year from that time.  Has never received mammogram.  She drinks 3-4 glasses of wine maybe 4 times per month.    PERTINENT  PMH / PSH:   Past Medical History:  Diagnosis Date   Hyperlipidemia    Hypertension    Obesity    OSA (obstructive sleep apnea)    Pre-diabetes    Tobacco use     OBJECTIVE:  BP 136/82    Pulse 84    Ht 5\' 7"  (1.702 m)    Wt 289 lb 6.4 oz (131.3 kg)    LMP 11/21/2012    SpO2  99%    BMI 45.33 kg/m   General: NAD, pleasant, able to participate in exam Cardiac: RRR, no murmurs auscultated. Respiratory: CTAB, normal effort, no wheezes, rales or rhonchi Extremities: warm and well perfused, no edema or cyanosis.  ASSESSMENT/PLAN:  Hyperlipidemia Check lipid panel today.  Continue Lipitor 40 mg in the meantime.  Titrate accordingly.  Type 2 diabetes mellitus without complications [facility]) Discussed with patient that the benefits outweigh the risks in terms of taking her metformin and I would advise continuing her metformin.  However, could consider starting GLP-1 agonist with benefit for weight loss and diabetes.  Check A1c today.  Essential hypertension Well-controlled.  Continue amlodipine 5 mg daily, Coreg 25 mg twice daily, HCTZ daily, losartan 100 mg daily.  Check BMP today.  Dyspnea on exertion Differential diagnosis remains broad and likely multifactorial and includes deconditioning, CHF, COPD, bronchitis, anemia, hypothyroidism.  Obtain TSH, CBC today.  Does not appear fluid overloaded or in distress at this time.  Physical exam unremarkable. Cardiology appointment in 5 days.  Obesity Weight loss from 311 pounds to 289 pounds today since 08/2020 through diet changes alone.  Encouraged and congratulated patient on her success.  Patient states she enjoys chips and is unsure how much food she can eat  because there is a lot of sugar and fruit.  Thoroughly discussed varied diet of vegetables and fruits.  Additionally, fruit sugar is much better tolerated compared to added sugars and most processed foods.  Discussed starting GLP-1 agonist through combined benefit of weight loss and diabetes management.  However, a varied diet with exercise and slow steady weight loss over time will always be the most beneficial to build healthy habits.  Advised patient that setbacks are common and expected.  Tobacco abuse Advised patient to schedule appointment with Dr. Raymondo Band for  initiating plan for smoking cessation.  Routine adult health maintenance Briefly discussed that several health maintenance topics need to be addressed in the future.  Follow-up for annual physical at next appointment to manage care gaps.  Patient states she had colonoscopy 6-7 years ago, although I cannot find evidence of this.  No evidence of mammogram either.  Last Pap on 02/09/2020 noted ASCUS requiring repeat with cotesting in 1 year.  Patient is further due for ophthalmology exam given type 2 diabetes in addition to being due for several vaccines should she be amenable.   Orders Placed This Encounter  Procedures   Lipid Panel   Basic Metabolic Panel   CBC   TSH   HgB A1c   No orders of the defined types were placed in this encounter.  Return in about 2 weeks (around 07/19/2021) for annual physical. Shelby Mattocks, DO 07/05/2021, 7:15 PM PGY-1, [facility]

## 2021-07-04 NOTE — Progress Notes (Signed)
Virginia Levine D.[facility] [address] [place] [address] Phone: [phone]   Assessment and Plan:     1. Chronic bilateral thoracic back pain 2. Chronic bilateral low back pain without sciatica - chronic with exacerbation, initial visit - chronic back pain in lumbar and thoracic regions without MOI or red flag symptoms.  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 May use remaining meloxicam as needed once daily for pain control.  Do not to use additional NSAIDs while taking meloxicam.  May use Tylenol [phone] mg to 3 times a day for breakthrough pain. -Start Flexeril 10 mg nightly for muscle spasms and pain relief - Start HEP for thoracic and lumbar spine.  Offered PT which patient declines at this visit, but could be considered at future visit - X-rays obtained in clinic.  My interpretation: No acute fracture or vertebral collapse.  Decreased vertebral space generally throughout thoracic spine.  No significant scoliosis - DG Lumbar/thoracic spine Complete; Future    Pertinent previous records reviewed include ER note 06/21/2021   Follow Up: 3 weeks for reevaluation.  Would discontinue regular use of meloxicam and Flexeril at that time and discuss long-term treatment plan.  Could offer physical therapy   Subjective:   I, Virginia Levine, am serving as a Neurosurgeon for Doctor Richardean Sale  Chief Complaint: back pain   HPI:   07/05/2021 Patient is a 56 year old female complaining of back pain. Patient states that when she gets up in the morning and eats and takes meds maybe like 5-7 minutes muscles will tighten has hx of slipped disc in neck feels like muscle spasm, been going on for about a year, gets numbness going down into the hands has hx of carpal tunnel , the whole back just tightens up sometimes her spine gets tight , hs been  taking ib 2 pills a day and stop taking because she needed to figure out what was going on.   Relevant Historical Information: Hypertension, elevated BMI  Additional pertinent review of systems negative.   Current Outpatient Medications:    albuterol (VENTOLIN HFA) 108 (90 Base) MCG/ACT inhaler, Inhale 1-2 puffs into the lungs every 6 (six) hours as needed for wheezing or shortness of breath., Disp: 8 g, Rfl: 0   amLODipine (NORVASC) 5 MG tablet, Take 1 tablet (5 mg total) by mouth daily., Disp: 90 tablet, Rfl: 2   aspirin 81 MG EC tablet, Take 1 tablet by mouth daily., Disp: , Rfl:    atorvastatin (LIPITOR) 40 MG tablet, Take 1 tablet (40 mg total) by mouth daily., Disp: 90 tablet, Rfl: 1   budesonide-formoterol (SYMBICORT) 80-4.5 MCG/ACT inhaler, Inhale 2 puffs into the lungs in the morning and at bedtime., Disp: 1 each, Rfl: 0   carvedilol (COREG) 25 MG tablet, Take 1 tablet (25 mg total) by mouth 2 (two) times daily with a meal., Disp: 180 tablet, Rfl: 0   ciclopirox (PENLAC) 8 % solution, Apply topically at bedtime. Apply over nail and surrounding skin. Apply daily over previous coat. After seven (7) days, may remove with alcohol and continue cycle., Disp: 6.6 mL, Rfl: 3   doxycycline (VIBRAMYCIN) 100 MG capsule, Take 1 capsule (100 mg total) by mouth 2 (two) times daily., Disp: 20 capsule, Rfl: 0   hydrochlorothiazide (HYDRODIURIL) 25 MG tablet, Take 1 tablet by mouth once daily, Disp: 90 tablet,  Rfl: 0   losartan (COZAAR) 100 MG tablet, Take 1 tablet by mouth once daily, Disp: 90 tablet, Rfl: 0   metFORMIN (GLUCOPHAGE-XR) 500 MG 24 hr tablet, Take 1 tablet by mouth once daily with breakfast, Disp: 90 tablet, Rfl: 0   metoprolol tartrate (LOPRESSOR) 100 MG tablet, Take one tablet by mouth 2 hours prior to CT Scan, Disp: 1 tablet, Rfl: 0   nortriptyline (PAMELOR) 25 MG capsule, Take 1 capsule (25 mg total) by mouth at bedtime., Disp: 30 capsule, Rfl: 1   potassium chloride SA (KLOR-CON M)  20 MEQ tablet, Take 1 tablet (20 mEq total) by mouth 2 (two) times daily., Disp: 60 tablet, Rfl: 0   simethicone (MYLICON) 80 MG chewable tablet, Chew 1 tablet (80 mg total) by mouth 3 (three) times daily as needed., Disp: 60 tablet, Rfl: 3   Objective:     Vitals:   07/05/21 1335  BP: 140/80  Pulse: 83  SpO2: 94%  Weight: 289 lb (131.1 kg)  Height: 5\' 7"  (1.702 m)      Body mass index is 45.26 kg/m.    Physical Exam:    Gen: Appears well, nad, nontoxic and pleasant Psych: Alert and oriented, appropriate mood and affect Neuro: sensation intact, strength is 5/5 in upper and lower extremities, muscle tone wnl Skin: no susupicious lesions or rashes  Back - Normal skin, Spine with normal alignment and no deformity.   Mild tenderness to thoracic and lumbar vertebral process palpation.   Lumbar and thoracic paraspinous muscles are  tender and mild spasm with palpation Straight leg raise negative  Electronically signed by:  D.[facility] 1:59 PM 07/05/21

## 2021-07-05 ENCOUNTER — Encounter: Payer: Self-pay | Admitting: Student

## 2021-07-05 ENCOUNTER — Other Ambulatory Visit (HOSPITAL_COMMUNITY): Payer: Self-pay

## 2021-07-05 ENCOUNTER — Ambulatory Visit (INDEPENDENT_AMBULATORY_CARE_PROVIDER_SITE_OTHER): Payer: No Typology Code available for payment source | Admitting: Sports Medicine

## 2021-07-05 ENCOUNTER — Ambulatory Visit (INDEPENDENT_AMBULATORY_CARE_PROVIDER_SITE_OTHER): Payer: No Typology Code available for payment source

## 2021-07-05 ENCOUNTER — Other Ambulatory Visit: Payer: Self-pay

## 2021-07-05 ENCOUNTER — Ambulatory Visit (INDEPENDENT_AMBULATORY_CARE_PROVIDER_SITE_OTHER): Payer: No Typology Code available for payment source | Admitting: Student

## 2021-07-05 VITALS — BP 136/82 | HR 84 | Ht 67.0 in | Wt 289.4 lb

## 2021-07-05 VITALS — BP 140/80 | HR 83 | Ht 67.0 in | Wt 289.0 lb

## 2021-07-05 DIAGNOSIS — M546 Pain in thoracic spine: Secondary | ICD-10-CM

## 2021-07-05 DIAGNOSIS — Z6841 Body Mass Index (BMI) 40.0 and over, adult: Secondary | ICD-10-CM

## 2021-07-05 DIAGNOSIS — M545 Low back pain, unspecified: Secondary | ICD-10-CM

## 2021-07-05 DIAGNOSIS — G8929 Other chronic pain: Secondary | ICD-10-CM

## 2021-07-05 DIAGNOSIS — R0609 Other forms of dyspnea: Secondary | ICD-10-CM | POA: Diagnosis not present

## 2021-07-05 DIAGNOSIS — Z0000 Encounter for general adult medical examination without abnormal findings: Secondary | ICD-10-CM | POA: Diagnosis not present

## 2021-07-05 DIAGNOSIS — E119 Type 2 diabetes mellitus without complications: Secondary | ICD-10-CM

## 2021-07-05 DIAGNOSIS — I10 Essential (primary) hypertension: Secondary | ICD-10-CM

## 2021-07-05 DIAGNOSIS — Z720 Tobacco use: Secondary | ICD-10-CM | POA: Diagnosis not present

## 2021-07-05 DIAGNOSIS — E785 Hyperlipidemia, unspecified: Secondary | ICD-10-CM

## 2021-07-05 LAB — POCT GLYCOSYLATED HEMOGLOBIN (HGB A1C): HbA1c, POC (controlled diabetic range): 6.5 % (ref 0.0–7.0)

## 2021-07-05 MED ORDER — CYCLOBENZAPRINE HCL 10 MG PO TABS
10.0000 mg | ORAL_TABLET | Freq: Every evening | ORAL | 0 refills | Status: DC | PRN
  Filled 2021-07-05: qty 15, 15d supply, fill #0

## 2021-07-05 MED ORDER — MELOXICAM 15 MG PO TABS
15.0000 mg | ORAL_TABLET | Freq: Every day | ORAL | 0 refills | Status: DC
  Filled 2021-07-05: qty 30, 30d supply, fill #0

## 2021-07-05 NOTE — Assessment & Plan Note (Signed)
Weight loss from 311 pounds to 289 pounds today since 08/2020 through diet changes alone.  Encouraged and congratulated patient on her success.  Patient states she enjoys chips and is unsure how much food she can eat because there is a lot of sugar and fruit.  Thoroughly discussed varied diet of vegetables and fruits.  Additionally, fruit sugar is much better tolerated compared to added sugars and most processed foods.  Discussed starting GLP-1 agonist through combined benefit of weight loss and diabetes management.  However, a varied diet with exercise and slow steady weight loss over time will always be the most beneficial to build healthy habits.  Advised patient that setbacks are common and expected.

## 2021-07-05 NOTE — Assessment & Plan Note (Signed)
Discussed with patient that the benefits outweigh the risks in terms of taking her metformin and I would advise continuing her metformin.  However, could consider starting GLP-1 agonist with benefit for weight loss and diabetes.  Check A1c today.

## 2021-07-05 NOTE — Assessment & Plan Note (Signed)
Well-controlled.  Continue amlodipine 5 mg daily, Coreg 25 mg twice daily, HCTZ daily, losartan 100 mg daily.  Check BMP today.

## 2021-07-05 NOTE — Assessment & Plan Note (Signed)
Advised patient to schedule appointment with Dr. Raymondo Band for initiating plan for smoking cessation.

## 2021-07-05 NOTE — Assessment & Plan Note (Addendum)
Briefly discussed that several health maintenance topics need to be addressed in the future.  Follow-up for annual physical at next appointment to manage care gaps.  Patient states she had colonoscopy 6-7 years ago, although I cannot find evidence of this.  No evidence of mammogram either.  Last Pap on 02/09/2020 noted ASCUS requiring repeat with cotesting in 1 year.  Patient is further due for ophthalmology exam given type 2 diabetes in addition to being due for several vaccines should she be amenable.

## 2021-07-05 NOTE — Assessment & Plan Note (Signed)
Differential diagnosis remains broad and likely multifactorial and includes deconditioning, CHF, COPD, bronchitis, anemia, hypothyroidism.  Obtain TSH, CBC today.  Does not appear fluid overloaded or in distress at this time.  Physical exam unremarkable. Cardiology appointment in 5 days.

## 2021-07-05 NOTE — Patient Instructions (Signed)
It was great to see you today! Thank you for choosing [facility] for your primary care. Virginia Levine was seen for reestablishing care with new PCP.  Today we addressed: Smoking cessation: Please make appointment with Dr. Raymondo Band for smoking cessation.  He is our pharmacist that is located in our clinic. Shortness of breath on exertion: I am getting several labs which may aid in diagnosing the cause of this.  Additionally, you have a cardiology appointment coming up and they may also aid in this.  Please mention your shortness of breath to them during this visit as well. Weight loss/diabetes: I am checking your A1c today.  I would advise restarting the metformin, however we can make further adjustments to diabetes medications which may also have benefits with weight loss.  Again, congratulations on losing the weight that you have lost in the last year through diet management alone.  Please continue to have a varied diet of fresh/frozen fruits and vegetables in addition to exercise if you are able. During your next visit, we will address your annual physical Healthcare maintenance in addition to following up but we did not complete today.   Orders Placed This Encounter  Procedures   Lipid Panel   Basic Metabolic Panel   CBC   TSH   HgB A1c   No orders of the defined types were placed in this encounter.  We are checking some labs today. If they are abnormal, I will call you. If they are normal, I will send you a MyChart message (if it is active) or a letter in the mail. If you do not hear about your labs in the next 2 weeks, please call the office.  You should return to our clinic Return in about 2 weeks (around 07/19/2021) for annual physical..  I recommend that you always bring your medications to each appointment as this makes it easy to ensure you are on the correct medications and helps [place] not miss refills when you need them.  Please arrive 15 minutes before your appointment to  ensure smooth check in process.  We appreciate your efforts in making this happen.  Take care and seek immediate care sooner if you develop any concerns.   Thank you for allowing me to participate in your care, Shelby Mattocks, DO 07/05/2021, 4:18 PM PGY-1, [facility]

## 2021-07-05 NOTE — Patient Instructions (Addendum)
Good to see you  Start meloxicam 15 mg daily x2 weeks.  If still having pain after 3 weeks, complete 4rd-week of meloxicam. May use remaining meloxicam as needed once daily for pain control.   Do not to use additional NSAIDs while taking meloxicam.   May use Tylenol [phone] mg to 3 times a day for breakthrough pain. Flexeril 10 mg at night for muscle spasm  Thoracic and lumbar HEP  3 week follow up

## 2021-07-05 NOTE — Assessment & Plan Note (Signed)
Check lipid panel today.  Continue Lipitor 40 mg in the meantime.  Titrate accordingly.

## 2021-07-10 ENCOUNTER — Encounter: Payer: Self-pay | Admitting: Physician Assistant

## 2021-07-10 ENCOUNTER — Other Ambulatory Visit: Payer: Self-pay

## 2021-07-10 ENCOUNTER — Ambulatory Visit: Payer: No Typology Code available for payment source | Admitting: Physician Assistant

## 2021-07-10 ENCOUNTER — Other Ambulatory Visit (HOSPITAL_COMMUNITY): Payer: Self-pay

## 2021-07-10 VITALS — BP 118/70 | HR 82 | Ht 67.0 in | Wt 293.4 lb

## 2021-07-10 DIAGNOSIS — R0609 Other forms of dyspnea: Secondary | ICD-10-CM

## 2021-07-10 DIAGNOSIS — Z720 Tobacco use: Secondary | ICD-10-CM

## 2021-07-10 DIAGNOSIS — I517 Cardiomegaly: Secondary | ICD-10-CM

## 2021-07-10 DIAGNOSIS — E782 Mixed hyperlipidemia: Secondary | ICD-10-CM | POA: Diagnosis not present

## 2021-07-10 DIAGNOSIS — I10 Essential (primary) hypertension: Secondary | ICD-10-CM

## 2021-07-10 HISTORY — DX: Cardiomegaly: I51.7

## 2021-07-10 MED ORDER — METOPROLOL TARTRATE 100 MG PO TABS
100.0000 mg | ORAL_TABLET | ORAL | 0 refills | Status: DC
  Filled 2021-07-10 – 2021-07-19 (×2): qty 1, 1d supply, fill #0

## 2021-07-10 NOTE — Assessment & Plan Note (Signed)
We discussed the importance of quitting. 

## 2021-07-10 NOTE — Assessment & Plan Note (Signed)
She continues to have issues with shortness of breath with exertion.  She did have cardiomegaly noted on chest x-ray.  We discussed how this could be related to poor inspiration versus true cardiomegaly.  An echocardiogram would help [place] diagnose LVH, dilated cardiomyopathy, etc.  She also has chest pain from time to time that sounds noncardiac.  She has several risk factors including family history, smoking and diabetes.  I agree with pursuing coronary CTA and echocardiogram as previously directed by Dr. Shari Prows.  Arrange coronary CTA  Arrange echocardiogram  Obtain BNP  Follow-up 3 months

## 2021-07-10 NOTE — Assessment & Plan Note (Signed)
Managed by primary care.  She remains on atorvastatin 40 mg daily.

## 2021-07-10 NOTE — Patient Instructions (Signed)
Medication Instructions:  Your physician recommends that you continue on your current medications as directed. Please refer to the Current Medication list given to you today.   *If you need a refill on your cardiac medications before your next appointment, please call your pharmacy*   Lab Work:  TODAY!!!!! PRO BNP  If you have labs (blood work) drawn today and your tests are completely normal, you will receive your results only by: MyChart Message (if you have MyChart) OR A paper copy in the mail If you have any lab test that is abnormal or we need to change your treatment, we will call you to review the results.   Testing/Procedures:  Your physician has requested that you have an echocardiogram. Echocardiography is a painless test that uses sound waves to create images of your heart. It provides your doctor with information about the size and shape of your heart and how well your hearts chambers and valves are working. This procedure takes approximately one hour. There are no restrictions for this procedure.     Your cardiac CT will be scheduled at one of the below locations:   [facility] [address] [place], [place] [address] [phone]   If scheduled at [facility], please arrive at the [facility] main entrance (entrance A) of [facility] 30 minutes prior to test start time. You can use the FREE valet parking offered at the main entrance (encouraged to control the heart rate for the test) Proceed to the [facility] Radiology Department (first floor) to check-in and test prep.  Please follow these instructions carefully (unless otherwise directed):   On the Night Before the Test:  Be sure to Drink plenty of water. Do not consume any caffeinated/decaffeinated beverages or chocolate 12 hours prior to your test. Do not take any antihistamines 12 hours prior to your test.  On the Day of the Test: Drink plenty of water until 1 hour prior  to the test. Do not eat any food 4 hours prior to the test. You may take your regular medications prior to the test.  Take metoprolol (Lopressor) two hours prior to test. HOLD Hydrochlorothiazide morning of the test. FEMALES- please wear underwire-free bra if available, avoid dresses & tight clothing       After the Test: Drink plenty of water. After receiving IV contrast, you may experience a mild flushed feeling. This is normal. On occasion, you may experience a mild rash up to 24 hours after the test. This is not dangerous. If this occurs, you can take Benadryl 25 mg and increase your fluid intake. If you experience trouble breathing, this can be serious. If it is severe call 911 IMMEDIATELY. If it is mild, please call our office.   We will call to schedule your test 2-4 weeks out understanding that some insurance companies will need an authorization prior to the service being performed.   For non-scheduling related questions, please contact the cardiac imaging nurse navigator should you have any questions/concerns: Rockwell Alexandria, Cardiac Imaging Nurse Navigator Larey Brick, Cardiac Imaging Nurse Navigator [facility] Direct Office Dial: [phone]   For scheduling needs, including cancellations and rescheduling, please call [place], [phone].   Follow-Up: At [facility], you and your health needs are our priority.  As part of our continuing mission to provide you with exceptional heart care, we have created designated Provider Care Teams.  These Care Teams include your primary Cardiologist (physician) and Advanced Practice Providers (  APPs -  Physician Assistants and Nurse Practitioners) who all work together to provide you with the care you need, when you need it.  We recommend signing up for the patient portal called "MyChart".  Sign up information is provided on this After Visit Summary.  MyChart is used to connect with patients for Virtual  Visits (Telemedicine).  Patients are able to view lab/test results, encounter notes, upcoming appointments, etc.  Non-urgent messages can be sent to your provider as well.   To learn more about what you can do with MyChart, go to [email or web address].    Your next appointment:   3 month(s)  The format for your next appointment:   In Person  Provider:  Tereso Newcomer, PA-C Meriam Sprague, MD     Other Instructions  Patient will call office to Schedule appointment in 3 months.

## 2021-07-10 NOTE — Assessment & Plan Note (Signed)
Noted on chest x-ray.  Obtain echocardiogram.

## 2021-07-10 NOTE — Progress Notes (Signed)
Cardiology Office Note:    Date:  07/10/2021   ID:  Virginia Levine, DOB 1966/05/10, MRN [id number]  PCP:  Shelby Mattocks, DO  [facility] Providers Cardiologist:  Meriam Sprague, MD     Referring MD: Maury Dus, MD   Chief Complaint:  Cardiomegaly and Shortness of Breath    Patient Profile: Hypertension  Hyperlipidemia  Diabetes mellitus  OSA on CPAP  +Cigs Morbid obesity  Prior CV Studies:    History of Present Illness:   Virginia Levine is a 56 y.o. female with the above problem list.  She was evaluated by Dr. Shari Prows in 2/22 for shortness of breath, chest pain.  CCTA and Echocardiogram were ordered but never performed.  She was seen in Urgent Care recently for the flu.  A CXR demonstrated cardiomegaly and she was asked to f/u with Cardiology.   She is here alone.  Her congestion and cough have improved.  She remains short of breath with exertion.  She sleeps with a CPAP.  She has not had PND, leg edema, syncope.  She has chest discomfort, mainly at night when she lays down.    Past Medical History:  Diagnosis Date   Hyperlipidemia    Hypertension    Obesity    OSA (obstructive sleep apnea)    Pre-diabetes    Tobacco use    Current Medications: Current Meds  Medication Sig   amLODipine (NORVASC) 5 MG tablet Take 1 tablet (5 mg total) by mouth daily.   aspirin 81 MG EC tablet Take 1 tablet by mouth daily.   atorvastatin (LIPITOR) 40 MG tablet Take 1 tablet (40 mg total) by mouth daily.   carvedilol (COREG) 25 MG tablet Take 1 tablet (25 mg total) by mouth 2 (two) times daily with a meal.   hydrochlorothiazide (HYDRODIURIL) 25 MG tablet Take 1 tablet by mouth once daily   losartan (COZAAR) 100 MG tablet Take 1 tablet by mouth once daily   metoprolol tartrate (LOPRESSOR) 100 MG tablet Take 1 tablet (100 mg total) by mouth as directed 90-120 minutes prior to test.   potassium chloride SA (KLOR-CON M) 20 MEQ tablet Take 1 tablet (20 mEq total) by mouth 2 (two)  times daily.    Allergies:   Lisinopril   Social History   Tobacco Use   Smoking status: Light Smoker    Packs/day: 0.15    Types: Cigarettes   Smokeless tobacco: Never   Tobacco comments:    smokes with drinking more (once a month), 5-6 cigarettes a day  Vaping Use   Vaping Use: Never used  Substance Use Topics   Alcohol use: Yes    Comment: occasional   Drug use: No    Family Hx: The patient's family history includes Aneurysm in her sister and sister; Diabetes in her maternal grandmother and mother; Drug abuse in her sister; Heart attack in her father; Hypertension in her brother; Kidney failure in her brother; Leukemia (age of onset: 3) in her mother.  Review of Systems  Gastrointestinal:  Negative for hematochezia.  Genitourinary:  Negative for hematuria.    EKGs/Labs/Other Test Reviewed:    EKG:  EKG is   ordered today.  The ekg ordered today demonstrates NSR, HR 82, normal axis, no ST-T wave changes, QTc 434  Recent Labs: 06/06/2021: BUN 11; Creatinine, Ser 0.70; Hemoglobin 13.7; Platelets 212; Potassium 3.3; Sodium 141   Recent Lipid Panel No results for input(s): CHOL, TRIG, HDL, VLDL, LDLCALC, LDLDIRECT in the last 8760 hours.  Risk Assessment/Calculations:         Physical Exam:    VS:  BP 118/70 (BP Location: Right Arm, Patient Position: Sitting, Cuff Size: Large)    Pulse 82    Ht 5\' 7"  (1.702 m)    Wt 293 lb 6.4 oz (133.1 kg)    LMP 11/21/2012    SpO2 97%    BMI 45.95 kg/m     Wt Readings from Last 3 Encounters:  07/10/21 293 lb 6.4 oz (133.1 kg)  07/05/21 289 lb 6.4 oz (131.3 kg)  07/05/21 289 lb (131.1 kg)    Constitutional:      Appearance: Healthy appearance. Not in distress.  Neck:     Vascular: No JVR.  Pulmonary:     Effort: Pulmonary effort is normal.     Breath sounds: No wheezing. No rales.  Cardiovascular:     Normal rate. Regular rhythm. Normal S1. Normal S2.      Murmurs: There is no murmur.  Edema:    Peripheral edema absent.   Abdominal:     Palpations: Abdomen is soft.  Skin:    General: Skin is warm and dry.  Neurological:     Mental Status: Alert and oriented to person, place and time.     Cranial Nerves: Cranial nerves are intact.        ASSESSMENT & PLAN:   Dyspnea on exertion She continues to have issues with shortness of breath with exertion.  She did have cardiomegaly noted on chest x-ray.  We discussed how this could be related to poor inspiration versus true cardiomegaly.  An echocardiogram would help [place] diagnose LVH, dilated cardiomyopathy, etc.  She also has chest pain from time to time that sounds noncardiac.  She has several risk factors including family history, smoking and diabetes.  I agree with pursuing coronary CTA and echocardiogram as previously directed by Dr. Johney Frame. Arrange coronary CTA Arrange echocardiogram Obtain BNP Follow-up 3 months  Essential hypertension Blood pressure is well controlled.  Continue amlodipine 5 mg daily, carvedilol 25 mg twice daily, HCTZ 25 mg daily, losartan 100 mg daily.  Cardiomegaly Noted on chest x-ray.  Obtain echocardiogram.  Hyperlipidemia Managed by primary care.  She remains on atorvastatin 40 mg daily.  Tobacco abuse We discussed the importance of quitting.          Dispo:  Return in about 3 months (around 10/07/2021) for Routine Follow Up, w/ Dr. Johney Frame, or Richardson Dopp, PA-C.   Medication Adjustments/Labs and Tests Ordered: Current medicines are reviewed at length with the patient today.  Concerns regarding medicines are outlined above.  Tests Ordered: Orders Placed This Encounter  Procedures   CT CORONARY MORPH W/CTA COR W/SCORE W/CA W/CM &/OR WO/CM   Pro b natriuretic peptide   EKG 12-Lead   ECHOCARDIOGRAM COMPLETE   Medication Changes: Meds ordered this encounter  Medications   metoprolol tartrate (LOPRESSOR) 100 MG tablet    Sig: Take 1 tablet (100 mg total) by mouth as directed 90-120 minutes prior to test.    Dispense:   1 tablet    Refill:  0   Signed, Richardson Dopp, PA-C  07/10/2021 [facility] [place], [place], [place]  [address] Phone: [phone]; Fax: [phone]

## 2021-07-10 NOTE — Assessment & Plan Note (Signed)
Blood pressure is well controlled.  Continue amlodipine 5 mg daily, carvedilol 25 mg twice daily, HCTZ 25 mg daily, losartan 100 mg daily.

## 2021-07-11 LAB — PRO B NATRIURETIC PEPTIDE: NT-Pro BNP: 289 pg/mL — ABNORMAL HIGH (ref 0–287)

## 2021-07-16 ENCOUNTER — Other Ambulatory Visit: Payer: Self-pay

## 2021-07-16 ENCOUNTER — Other Ambulatory Visit (HOSPITAL_COMMUNITY): Payer: Self-pay

## 2021-07-16 DIAGNOSIS — I10 Essential (primary) hypertension: Secondary | ICD-10-CM

## 2021-07-16 DIAGNOSIS — E785 Hyperlipidemia, unspecified: Secondary | ICD-10-CM

## 2021-07-16 MED ORDER — ATORVASTATIN CALCIUM 40 MG PO TABS
40.0000 mg | ORAL_TABLET | Freq: Every day | ORAL | 2 refills | Status: DC
  Filled 2021-07-16: qty 90, 90d supply, fill #0

## 2021-07-16 MED ORDER — HYDROCHLOROTHIAZIDE 25 MG PO TABS
25.0000 mg | ORAL_TABLET | Freq: Every day | ORAL | 2 refills | Status: DC
  Filled 2021-07-16: qty 90, 90d supply, fill #0

## 2021-07-18 ENCOUNTER — Other Ambulatory Visit (HOSPITAL_COMMUNITY): Payer: Self-pay

## 2021-07-19 ENCOUNTER — Other Ambulatory Visit (HOSPITAL_COMMUNITY): Payer: Self-pay | Admitting: *Deleted

## 2021-07-19 ENCOUNTER — Other Ambulatory Visit (HOSPITAL_COMMUNITY): Payer: Self-pay

## 2021-07-19 ENCOUNTER — Telehealth (HOSPITAL_COMMUNITY): Payer: Self-pay | Admitting: *Deleted

## 2021-07-19 ENCOUNTER — Other Ambulatory Visit: Payer: Self-pay | Admitting: Student

## 2021-07-19 DIAGNOSIS — Z01812 Encounter for preprocedural laboratory examination: Secondary | ICD-10-CM

## 2021-07-19 DIAGNOSIS — I10 Essential (primary) hypertension: Secondary | ICD-10-CM

## 2021-07-19 NOTE — Telephone Encounter (Signed)
Patient returning call regarding upcoming cardiac imaging study; pt verbalizes understanding of appt date/time, parking situation and where to check in, pre-test NPO status and medications ordered, and verified current allergies; name and call back number provided for further questions should they arise  Gordy Clement RN Navigator [facility] [phone] office [phone] cell  Patient is a difficult IV Start.  She will arrive around 3:30pm when she gets off work for extra time for the IV start.  She will take her metoprolol tartrate at 1:30pm.

## 2021-07-19 NOTE — Telephone Encounter (Signed)
Attempted to call patient regarding upcoming cardiac CT appointment and to ask patient to obtain blood work prior to her cardia CT scan. Left message on voicemail with name and callback number  Larey Brick RN Navigator Cardiac Imaging [facility] [phone] Office [phone] Cell

## 2021-07-20 ENCOUNTER — Telehealth: Payer: Self-pay | Admitting: Student

## 2021-07-20 ENCOUNTER — Other Ambulatory Visit (HOSPITAL_COMMUNITY): Payer: Self-pay

## 2021-07-20 ENCOUNTER — Other Ambulatory Visit: Payer: Self-pay | Admitting: Student

## 2021-07-20 DIAGNOSIS — I10 Essential (primary) hypertension: Secondary | ICD-10-CM

## 2021-07-20 MED ORDER — POTASSIUM CHLORIDE CRYS ER 20 MEQ PO TBCR
20.0000 meq | EXTENDED_RELEASE_TABLET | Freq: Two times a day (BID) | ORAL | 0 refills | Status: DC
  Filled 2021-07-20 – 2021-07-24 (×2): qty 60, 30d supply, fill #0

## 2021-07-20 NOTE — Telephone Encounter (Signed)
Advised pt that A1c improved to 6.5. Refilled Kcl but she will need to get bloodwork and may consider changing dosage in the near future.

## 2021-07-23 ENCOUNTER — Encounter (HOSPITAL_COMMUNITY): Payer: Self-pay

## 2021-07-23 ENCOUNTER — Ambulatory Visit (HOSPITAL_COMMUNITY)
Admission: RE | Admit: 2021-07-23 | Discharge: 2021-07-23 | Disposition: A | Discharge status: Home / Self Care | Payer: No Typology Code available for payment source | Source: Ambulatory Visit | Attending: Physician Assistant | Admitting: Physician Assistant

## 2021-07-23 ENCOUNTER — Other Ambulatory Visit: Payer: Self-pay

## 2021-07-23 ENCOUNTER — Other Ambulatory Visit (HOSPITAL_COMMUNITY): Payer: Self-pay

## 2021-07-23 ENCOUNTER — Other Ambulatory Visit: Payer: Self-pay | Admitting: Cardiovascular Disease

## 2021-07-23 DIAGNOSIS — R931 Abnormal findings on diagnostic imaging of heart and coronary circulation: Secondary | ICD-10-CM

## 2021-07-23 DIAGNOSIS — E782 Mixed hyperlipidemia: Secondary | ICD-10-CM | POA: Insufficient documentation

## 2021-07-23 DIAGNOSIS — I517 Cardiomegaly: Secondary | ICD-10-CM | POA: Diagnosis not present

## 2021-07-23 DIAGNOSIS — R0609 Other forms of dyspnea: Secondary | ICD-10-CM | POA: Insufficient documentation

## 2021-07-23 DIAGNOSIS — I10 Essential (primary) hypertension: Secondary | ICD-10-CM | POA: Insufficient documentation

## 2021-07-23 LAB — POCT I-STAT CREATININE: Creatinine, Ser: 0.8 mg/dL (ref 0.44–1.00)

## 2021-07-23 MED ORDER — NITROGLYCERIN 0.4 MG SL SUBL
0.8000 mg | SUBLINGUAL_TABLET | Freq: Once | SUBLINGUAL | Status: AC
  Administered 2021-07-23: 0.8 mg via SUBLINGUAL

## 2021-07-23 MED ORDER — DILTIAZEM HCL 25 MG/5ML IV SOLN
INTRAVENOUS | Status: AC
  Filled 2021-07-23: qty 5

## 2021-07-23 MED ORDER — IOHEXOL 350 MG/ML SOLN
100.0000 mL | Freq: Once | INTRAVENOUS | Status: AC | PRN
  Administered 2021-07-23: 100 mL via INTRAVENOUS

## 2021-07-23 MED ORDER — DILTIAZEM HCL 25 MG/5ML IV SOLN
10.0000 mg | INTRAVENOUS | Status: DC | PRN
  Administered 2021-07-23 (×2): 10 mg via INTRAVENOUS

## 2021-07-23 MED ORDER — NITROGLYCERIN 0.4 MG SL SUBL
SUBLINGUAL_TABLET | SUBLINGUAL | Status: AC
  Filled 2021-07-23: qty 2

## 2021-07-23 MED ORDER — METOPROLOL TARTRATE 5 MG/5ML IV SOLN
INTRAVENOUS | Status: AC
  Filled 2021-07-23: qty 20

## 2021-07-23 MED ORDER — METOPROLOL TARTRATE 5 MG/5ML IV SOLN
10.0000 mg | INTRAVENOUS | Status: DC | PRN
  Administered 2021-07-23 (×2): 10 mg via INTRAVENOUS

## 2021-07-24 ENCOUNTER — Other Ambulatory Visit (HOSPITAL_COMMUNITY): Payer: Self-pay

## 2021-07-24 ENCOUNTER — Encounter: Payer: Self-pay | Admitting: Physician Assistant

## 2021-07-24 ENCOUNTER — Ambulatory Visit (HOSPITAL_COMMUNITY)
Admission: RE | Admit: 2021-07-24 | Discharge: 2021-07-24 | Disposition: A | Discharge status: Home / Self Care | Payer: No Typology Code available for payment source | Source: Ambulatory Visit | Attending: Cardiovascular Disease | Admitting: Cardiovascular Disease

## 2021-07-24 DIAGNOSIS — I2510 Atherosclerotic heart disease of native coronary artery without angina pectoris: Secondary | ICD-10-CM | POA: Insufficient documentation

## 2021-07-24 DIAGNOSIS — R931 Abnormal findings on diagnostic imaging of heart and coronary circulation: Secondary | ICD-10-CM | POA: Diagnosis not present

## 2021-07-24 HISTORY — DX: Atherosclerotic heart disease of native coronary artery without angina pectoris: I25.10

## 2021-08-01 ENCOUNTER — Encounter: Payer: Self-pay | Admitting: Student

## 2021-08-01 DIAGNOSIS — K760 Fatty (change of) liver, not elsewhere classified: Secondary | ICD-10-CM | POA: Insufficient documentation

## 2021-08-01 NOTE — Progress Notes (Signed)
? ? Virginia Levine D.Virginia Levine ?[facility] ?[place] ?Phone: [phone] ?  ?Assessment and Plan:   ?  ?1. Chronic bilateral thoracic back pain ?2. Chronic bilateral low back pain without sciatica ?- Chronic with exacerbation, subsequent sports medicine visit ?-Chronic pain in lumbar and thoracic regions without MOI or red flag symptoms.  Suspect patient's BMI and physically demanding job have contributed to chronic pain with acute flares ?- Patient had a misunderstanding with meloxicam and how to take it from last visit.  She was taking it every other day.  Recommend completing remaining meloxicam 15 mg daily (1 tablet for the next 14 days) and then discontinuing ?- We will refill Flexeril 10 mg nightly as needed for muscle spasms ?- Use Tylenol 500 to 1000 mg 2-3 times a day for breakthrough pain ?- Referral to physical therapy for back strengthening, ergonomic help with how to properly lift at her job ?- Continue HEP ?  ?Pertinent previous records reviewed include none ?  ?Follow Up: 4 weeks for reevaluation.  Could consider advanced imaging if no improvement or worsening of symptoms ?  ?Subjective:   ?I, Virginia Levine, am serving as a Education administrator for Doctor [employer] ? ?Chief Complaint: back pain  ? ?HPI:  ?07/05/2021 ?Patient is a 56 year old female complaining of back pain. Patient states that when she gets up in the morning and eats and takes meds maybe like 5-7 minutes muscles will tighten has hx of slipped disc in neck feels like muscle spasm, been going on for about a year, gets numbness going down into the hands has hx of carpal tunnel , the whole back just tightens up sometimes her spine gets tight , hs been taking ib 2 pills a day and stop taking because she needed to figure out what was going on.  ?  ?08/02/2021 ?Patient states that she is doing okay the pain meds aren't really working but she was only taking every other day , but also thinks her  work isn't allow for the pain to ease off she walks a lot , a lot of bending and moving , flexeril is working really well. Is going to talk with her PCP about medicine that she is taking it does make her back tight.  ? ? ? ?Relevant Historical Information: Hypertension, elevated BMI ? ?Additional pertinent review of systems negative. ? ? ?Current Outpatient Medications:  ?  amLODipine (NORVASC) 5 MG tablet, Take 1 tablet (5 mg total) by mouth daily., Disp: 90 tablet, Rfl: 2 ?  aspirin 81 MG EC tablet, Take 1 tablet by mouth daily., Disp: , Rfl:  ?  atorvastatin (LIPITOR) 40 MG tablet, Take 1 tablet (40 mg total) by mouth daily., Disp: 90 tablet, Rfl: 2 ?  carvedilol (COREG) 25 MG tablet, Take 1 tablet (25 mg total) by mouth 2 (two) times daily with a meal., Disp: 180 tablet, Rfl: 0 ?  cyclobenzaprine (FLEXERIL) 10 MG tablet, Take 1 tablet (10 mg total) by mouth at bedtime as needed for muscle spasms., Disp: 15 tablet, Rfl: 0 ?  hydrochlorothiazide (HYDRODIURIL) 25 MG tablet, Take 1 tablet (25 mg total) by mouth daily., Disp: 90 tablet, Rfl: 2 ?  losartan (COZAAR) 100 MG tablet, Take 1 tablet by mouth once daily, Disp: 90 tablet, Rfl: 0 ?  metoprolol tartrate (LOPRESSOR) 100 MG tablet, Take 1 tablet (100 mg total) by mouth as directed 90-120 minutes prior to test., Disp: 1 tablet, Rfl: 0 ?  potassium chloride SA (KLOR-CON M) 20 MEQ tablet, Take 1 tablet (20 mEq total) by mouth 2 (two) times daily., Disp: 60 tablet, Rfl: 0  ? ?Objective:   ?  ?Vitals:  ? 08/02/21 0933  ?BP: 122/82  ?Pulse: 72  ?SpO2: 97%  ?Weight: 295 lb (133.8 kg)  ?Height: 5\' 7"  (1.702 m)  ?  ?  ?Body mass index is 46.2 kg/m?.  ?  ?Physical Exam:   ? ?Gen: Appears well, nad, nontoxic and pleasant ?Psych: Alert and oriented, appropriate mood and affect ?Neuro: sensation intact, strength is 5/5 in upper and lower extremities, muscle tone wnl ?Skin: no susupicious lesions or rashes ?  ?Back - Normal skin, Spine with normal alignment and no deformity.    ?Mild tenderness to thoracic and lumbar vertebral process palpation.   ?Lumbar and thoracic paraspinous muscles are  tender and mild spasm with palpation ?Straight leg raise negative ?  ? ? ?Electronically signed by:  ?Virginia Levine D.Virginia Levine ?[facility] ?9:50 AM 08/02/21 ?

## 2021-08-02 ENCOUNTER — Ambulatory Visit (INDEPENDENT_AMBULATORY_CARE_PROVIDER_SITE_OTHER): Payer: No Typology Code available for payment source | Admitting: Sports Medicine

## 2021-08-02 ENCOUNTER — Other Ambulatory Visit (HOSPITAL_COMMUNITY): Payer: Self-pay

## 2021-08-02 ENCOUNTER — Other Ambulatory Visit: Payer: Self-pay

## 2021-08-02 VITALS — BP 122/82 | HR 72 | Ht 67.0 in | Wt 295.0 lb

## 2021-08-02 DIAGNOSIS — M546 Pain in thoracic spine: Secondary | ICD-10-CM | POA: Diagnosis not present

## 2021-08-02 DIAGNOSIS — M545 Low back pain, unspecified: Secondary | ICD-10-CM | POA: Diagnosis not present

## 2021-08-02 DIAGNOSIS — G8929 Other chronic pain: Secondary | ICD-10-CM | POA: Diagnosis not present

## 2021-08-02 MED ORDER — CYCLOBENZAPRINE HCL 10 MG PO TABS
10.0000 mg | ORAL_TABLET | Freq: Every evening | ORAL | 0 refills | Status: DC | PRN
  Filled 2021-08-02: qty 15, 15d supply, fill #0

## 2021-08-02 NOTE — Patient Instructions (Addendum)
Good to see you  ?Flexeril refill  ?Recommend taking remaining meloxicam daily until completion  ?Using Tylenol [id number] mg 2-3 times a day for pain relief  ?Referral to PT for low back  ?4 week follow up  ?

## 2021-08-07 ENCOUNTER — Other Ambulatory Visit: Payer: Self-pay | Admitting: *Deleted

## 2021-08-07 DIAGNOSIS — E782 Mixed hyperlipidemia: Secondary | ICD-10-CM

## 2021-08-07 DIAGNOSIS — E7800 Pure hypercholesterolemia, unspecified: Secondary | ICD-10-CM

## 2021-08-09 ENCOUNTER — Other Ambulatory Visit: Payer: Self-pay

## 2021-08-09 DIAGNOSIS — I10 Essential (primary) hypertension: Secondary | ICD-10-CM

## 2021-08-10 ENCOUNTER — Other Ambulatory Visit (HOSPITAL_COMMUNITY): Payer: Self-pay

## 2021-08-10 MED ORDER — LOSARTAN POTASSIUM 100 MG PO TABS
100.0000 mg | ORAL_TABLET | Freq: Every day | ORAL | 1 refills | Status: DC
  Filled 2021-08-10: qty 90, 90d supply, fill #0

## 2021-08-14 ENCOUNTER — Ambulatory Visit: Payer: No Typology Code available for payment source

## 2021-08-14 NOTE — Therapy (Incomplete)
?OUTPATIENT PHYSICAL THERAPY THORACOLUMBAR EVALUATION ? ? ?Patient Name: Virginia Levine ?MRN: [id number] ?DOB:12-07-1965, 56 y.o., female ?Today's Date: 08/14/2021 ? ? ? ?Past Medical History:  ?Diagnosis Date  ? Coronary artery disease 07/24/2021  ? CCTA 2/23: CAC score 601 (99th percentile); pLAD 25-49, mLAD 1-24, pD1 25-49, pLCx 25-49, pOM1 1-24; FFR negative; Asc Aorta 3.6 cm; hepatic steatosis   ? Hyperlipidemia   ? Hypertension   ? Obesity   ? OSA (obstructive sleep apnea)   ? Pre-diabetes   ? Tobacco use   ? ?Past Surgical History:  ?Procedure Laterality Date  ? LEEP  2018  ? ?Patient Active Problem List  ? Diagnosis Date Noted  ? Hepatic steatosis 08/01/2021  ? Coronary artery disease involving native coronary artery of native heart without angina pectoris 07/24/2021  ? Cardiomegaly 07/10/2021  ? Dyspnea on exertion 07/05/2021  ? Routine adult health maintenance 07/04/2021  ? Insurance coverage problems 08/02/2020  ? Chest pain 08/02/2020  ? Insomnia 04/13/2020  ? Flat foot 04/13/2020  ? Hypokalemia 03/23/2020  ? Type 2 diabetes mellitus without complications (HCC) 02/14/2020  ? OSA (obstructive sleep apnea) 12/15/2019  ? Screen for colon cancer 12/15/2019  ? Elevated liver enzymes 07/06/2018  ? Gastroesophageal reflux disease 03/16/2018  ? History of abnormal cervical Pap smear 11/26/2016  ? Acute pulmonary edema (HCC) 06/04/2015  ? CAP (community acquired pneumonia) 06/04/2015  ? Other specified abnormal uterine and vaginal bleeding 03/24/2015  ? Rectal bleed 03/24/2015  ? Candidal intertrigo 01/11/2015  ? Hyperlipidemia 01/11/2015  ? Obesity 01/21/2013  ? Tobacco abuse 01/21/2013  ? Essential hypertension 12/11/2012  ? ? ?PCP: Shelby Mattocks, DO ? ?REFERRING PROVIDER: Richardean Sale, DO ? ?REFERRING DIAG:  ?Chronic bilateral thoracic back pain; Chronic bilateral low back pain without sciatica ? ? ?THERAPY DIAG:  ?No diagnosis found. ? ?ONSET DATE: *** ? ?SUBJECTIVE:                                                                                                                                                                                           ? ?SUBJECTIVE STATEMENT: ?*** ?PERTINENT HISTORY:  ?*** ? ?PAIN:  ?Are you having pain? {OPRCPAIN:27236} ? ? ?PRECAUTIONS: {Therapy precautions:24002} ? ?WEIGHT BEARING RESTRICTIONS {Yes ***/No:24003} ? ?FALLS:  ?Has patient fallen in last 6 months? {yes/no:20286}, Number of falls: *** ? ?LIVING ENVIRONMENT: ?Lives with: {OPRC lives with:25569::"lives with their family"} ?Lives in: {Lives in:25570} ?Stairs: {yes/no:20286}; {Stairs:24000} ?Has following equipment at home: {Assistive devices:23999} ? ?OCCUPATION: *** ? ?PLOF: {PLOF:24004} ? ?PATIENT GOALS *** ? ? ?OBJECTIVE:  ? ?DIAGNOSTIC FINDINGS:  ?FINDINGS: ?Five non-rib-bearing lumbar vertebrae. Mild anterior and lateral ?spur formation  at multiple levels. Facet degenerative changes in the ?lower lumbar spine. No fractures, pars defects or subluxations. ?Atheromatous arterial calcifications without visible aneurysm. ?  ?IMPRESSION: ?Degenerative changes.  No acute abnormality. ?  ?  ?Electronically Signed ?  By: Beckie Salts M.D. ?  On: 07/07/2021 13:59 ? ?FINDINGS: ?Moderate anterior and lateral spur formation at multiple levels of ?the mid and lower thoracic spine. No fractures or subluxations are ?seen. ?  ?IMPRESSION: ?Moderate multilevel degenerative changes. ?  ?  ?Electronically Signed ?  By: Beckie Salts M.D. ?  On: 07/07/2021 13:57 ? ?PATIENT SURVEYS:  ?{rehab surveys:24030} ? ?SCREENING FOR RED FLAGS: ?Bowel or bladder incontinence: {Yes/No:304960894} ?Spinal tumors: {Yes/No:304960894} ?Cauda equina syndrome: {Yes/No:304960894} ?Compression fracture: {Yes/No:304960894} ?Abdominal aneurysm: {Yes/No:304960894} ? ?COGNITION: ? Overall cognitive status: {cognition:24006}   ?  ?SENSATION: ?{sensation:27233} ? ?MUSCLE LENGTH: ?Hamstrings: Right *** deg; Left *** deg ?Thomas test: Right *** deg; Left ***  deg ? ?POSTURE:  ?*** ? ?PALPATION: ?*** ? ?LUMBAR ROM:  ? ?{AROM/PROM:27142}  A/PROM  ?08/14/2021  ?Flexion   ?Extension   ?Right lateral flexion   ?Left lateral flexion   ?Right rotation   ?Left rotation   ? (Blank rows = not tested) ? ?LE ROM: ? ?{AROM/PROM:27142}  Right ?08/14/2021 Left ?08/14/2021  ?Hip flexion    ?Hip extension    ?Hip abduction    ?Hip adduction    ?Hip internal rotation    ?Hip external rotation    ?Knee flexion    ?Knee extension    ?Ankle dorsiflexion    ?Ankle plantarflexion    ?Ankle inversion    ?Ankle eversion    ? (Blank rows = not tested) ? ?LE MMT: ? ?MMT Right ?08/14/2021 Left ?08/14/2021  ?Hip flexion    ?Hip extension    ?Hip abduction    ?Hip adduction    ?Hip internal rotation    ?Hip external rotation    ?Knee flexion    ?Knee extension    ?Ankle dorsiflexion    ?Ankle plantarflexion    ?Ankle inversion    ?Ankle eversion    ? (Blank rows = not tested) ? ?LUMBAR SPECIAL TESTS:  ?{lumbar special test:25242} ? ?FUNCTIONAL TESTS:  ?{Functional tests:24029} ? ?GAIT: ?Distance walked: *** ?Assistive device utilized: {Assistive devices:23999} ?Level of assistance: {Levels of assistance:24026} ?Comments: *** ? ? ? ?TODAY'S TREATMENT  ?*** ? ? ?PATIENT EDUCATION:  ?Education details: *** ?Person educated: {Person educated:25204} ?Education method: {Education Method:25205} ?Education comprehension: {Education Comprehension:25206} ? ? ?HOME EXERCISE PROGRAM: ?*** ? ?ASSESSMENT: ? ?CLINICAL IMPRESSION: ?Patient is a *** y.o. *** who was seen today for physical therapy evaluation and treatment for ***.  ? ? ?OBJECTIVE IMPAIRMENTS {opptimpairments:25111}.  ? ?ACTIVITY LIMITATIONS {activity limitations:25113}.  ? ?PERSONAL FACTORS {Personal factors:25162} are also affecting patient's functional outcome.  ? ? ?REHAB POTENTIAL: {rehabpotential:25112} ? ?CLINICAL DECISION MAKING: {clinical decision making:25114} ? ?EVALUATION COMPLEXITY: {Evaluation complexity:25115} ? ? ?GOALS: ?Goals reviewed  with patient? {yes/no:20286} ? ?SHORT TERM GOALS: Target date: {follow up:25551} ? ?*** ?Baseline: *** ?Goal status: {GOALSTATUS:25110} ? ?2.  *** ?Baseline: *** ?Goal status: {GOALSTATUS:25110} ? ?3.  *** ?Baseline: *** ?Goal status: {GOALSTATUS:25110} ? ?4.  *** ?Baseline: *** ?Goal status: {GOALSTATUS:25110} ? ?5.  *** ?Baseline: *** ?Goal status: {GOALSTATUS:25110} ? ?6.  *** ?Baseline: *** ?Goal status: {GOALSTATUS:25110} ? ?LONG TERM GOALS: Target date: {follow up:25551} ? ?*** ?Baseline: *** ?Goal status: {GOALSTATUS:25110} ? ?2.  *** ?Baseline: *** ?Goal status: {GOALSTATUS:25110} ? ?3.  *** ?Baseline: *** ?Goal status: {GOALSTATUS:25110} ? ?4.  *** ?  Baseline: *** ?Goal status: {GOALSTATUS:25110} ? ?5.  *** ?Baseline: *** ?Goal status: {GOALSTATUS:25110} ? ?6.  *** ?Baseline: *** ?Goal status: {GOALSTATUS:25110} ? ? ? ?PLAN: ?PT FREQUENCY: {rehab frequency:25116} ? ?PT DURATION: {rehab duration:25117} ? ?PLANNED INTERVENTIONS: {rehab planned interventions:25118::"Therapeutic exercises","Therapeutic activity","Neuromuscular re-education","Balance training","Gait training","Patient/Family education","Joint mobilization"}. ? ?PLAN FOR NEXT SESSION: *** ? ? ?Joellyn RuedAllen  Mozel Burdett, PT ?08/14/2021, 6:19 AM  ?

## 2021-08-16 ENCOUNTER — Encounter (HOSPITAL_COMMUNITY): Payer: Self-pay | Admitting: Physician Assistant

## 2021-08-16 ENCOUNTER — Other Ambulatory Visit: Payer: No Typology Code available for payment source

## 2021-08-16 ENCOUNTER — Encounter (HOSPITAL_COMMUNITY): Payer: Self-pay

## 2021-08-16 ENCOUNTER — Ambulatory Visit (HOSPITAL_COMMUNITY): Payer: No Typology Code available for payment source | Attending: Cardiology

## 2021-08-16 NOTE — Progress Notes (Signed)
Verified appointment "no show" status with G. Andrews at 10:34.  ?

## 2021-08-23 ENCOUNTER — Other Ambulatory Visit (HOSPITAL_COMMUNITY): Payer: Self-pay

## 2021-08-23 ENCOUNTER — Other Ambulatory Visit: Payer: Self-pay | Admitting: *Deleted

## 2021-08-23 DIAGNOSIS — I10 Essential (primary) hypertension: Secondary | ICD-10-CM

## 2021-08-23 MED ORDER — AMLODIPINE BESYLATE 5 MG PO TABS
5.0000 mg | ORAL_TABLET | Freq: Every day | ORAL | 2 refills | Status: DC
  Filled 2021-08-23: qty 90, 90d supply, fill #0
  Filled 2021-11-28: qty 90, 90d supply, fill #1
  Filled 2022-02-25: qty 90, 90d supply, fill #2

## 2021-08-24 ENCOUNTER — Other Ambulatory Visit (HOSPITAL_COMMUNITY): Payer: Self-pay

## 2021-08-27 ENCOUNTER — Telehealth: Payer: Self-pay | Admitting: Sports Medicine

## 2021-08-27 ENCOUNTER — Other Ambulatory Visit (HOSPITAL_COMMUNITY): Payer: Self-pay

## 2021-08-27 ENCOUNTER — Other Ambulatory Visit: Payer: Self-pay | Admitting: Sports Medicine

## 2021-08-27 ENCOUNTER — Other Ambulatory Visit: Payer: Self-pay

## 2021-08-27 DIAGNOSIS — I10 Essential (primary) hypertension: Secondary | ICD-10-CM

## 2021-08-27 MED ORDER — CYCLOBENZAPRINE HCL 10 MG PO TABS
10.0000 mg | ORAL_TABLET | Freq: Every evening | ORAL | 0 refills | Status: DC | PRN
  Filled 2021-08-27: qty 15, 15d supply, fill #0

## 2021-08-27 MED ORDER — CARVEDILOL 25 MG PO TABS
25.0000 mg | ORAL_TABLET | Freq: Two times a day (BID) | ORAL | 2 refills | Status: DC
  Filled 2021-08-27 – 2021-08-30 (×2): qty 180, 90d supply, fill #0

## 2021-08-27 NOTE — Telephone Encounter (Signed)
Pt was called and notified about the flexeril refill had been put in  ?

## 2021-08-27 NOTE — Telephone Encounter (Signed)
Patient called stating that she has a follow up with Dr Jean Rosenthal on Thursday but is feeling much better. She asked if Dr Jean Rosenthal would be able to refill her Flexeril without coming in for her appointment? ? ?Please advise. ? ?

## 2021-08-29 NOTE — Progress Notes (Deleted)
? ?   Virginia Levine D.Virginia Levine ?[facility] ?[address] [place] [address] ?Phone: [phone] ?  ?Assessment and Plan:   ?  ?There are no diagnoses linked to this encounter.  ?*** ?  ?Pertinent previous records reviewed include *** ?  ?Follow Up: ***  ? ?  ?Subjective:   ?I, Virginia Levine, am serving as a Neurosurgeon for Doctor [employer] ? ?Chief Complaint: thoracic back pain  ? ?HPI:  ?07/05/2021 ?Patient is a 56 year old female complaining of back pain. Patient states that when she gets up in the morning and eats and takes meds maybe like 5-7 minutes muscles will tighten has hx of slipped disc in neck feels like muscle spasm, been going on for about a year, gets numbness going down into the hands has hx of carpal tunnel , the whole back just tightens up sometimes her spine gets tight , hs been taking ib 2 pills a day and stop taking because she needed to figure out what was going on.  ?  ?08/02/2021 ?Patient states that she is doing okay the pain meds aren't really working but she was only taking every other day , but also thinks her work isn't allow for the pain to ease off she walks a lot , a lot of bending and moving , flexeril is working really well. Is going to talk with her PCP about medicine that she is taking it does make her back tight.  ?  ?08/30/2021 ?Patient states ?  ?Relevant Historical Information: Hypertension, elevated BMI ? ?Additional pertinent review of systems negative. ? ? ?Current Outpatient Medications:  ?  amLODipine (NORVASC) 5 MG tablet, Take 1 tablet (5 mg total) by mouth daily., Disp: 90 tablet, Rfl: 2 ?  aspirin 81 MG EC tablet, Take 1 tablet by mouth daily., Disp: , Rfl:  ?  atorvastatin (LIPITOR) 40 MG tablet, Take 1 tablet (40 mg total) by mouth daily., Disp: 90 tablet, Rfl: 2 ?  carvedilol (COREG) 25 MG tablet, Take 1 tablet (25 mg total) by mouth 2 (two) times daily with a meal., Disp: 180 tablet, Rfl: 2 ?  cyclobenzaprine (FLEXERIL) 10 MG tablet,  Take 1 tablet (10 mg total) by mouth at bedtime as needed for muscle spasms., Disp: 15 tablet, Rfl: 0 ?  hydrochlorothiazide (HYDRODIURIL) 25 MG tablet, Take 1 tablet (25 mg total) by mouth daily., Disp: 90 tablet, Rfl: 2 ?  losartan (COZAAR) 100 MG tablet, Take 1 tablet (100 mg total) by mouth daily., Disp: 90 tablet, Rfl: 1 ?  metoprolol tartrate (LOPRESSOR) 100 MG tablet, Take 1 tablet (100 mg total) by mouth as directed 90-120 minutes prior to test., Disp: 1 tablet, Rfl: 0 ?  potassium chloride SA (KLOR-CON M) 20 MEQ tablet, Take 1 tablet (20 mEq total) by mouth 2 (two) times daily., Disp: 60 tablet, Rfl: 0  ? ?Objective:   ?  ?There were no vitals filed for this visit.  ?  ?There is no height or weight on file to calculate BMI.  ?  ?Physical Exam:   ? ?*** ? ? ?Electronically signed by:  ?Virginia Levine D.Virginia Levine ?[facility] ?11:42 AM 08/29/21 ?

## 2021-08-30 ENCOUNTER — Ambulatory Visit: Payer: No Typology Code available for payment source | Admitting: Sports Medicine

## 2021-08-30 ENCOUNTER — Encounter: Payer: Self-pay | Admitting: Student

## 2021-08-30 ENCOUNTER — Other Ambulatory Visit (HOSPITAL_COMMUNITY): Payer: Self-pay

## 2021-08-30 ENCOUNTER — Ambulatory Visit (INDEPENDENT_AMBULATORY_CARE_PROVIDER_SITE_OTHER): Payer: No Typology Code available for payment source | Admitting: Student

## 2021-08-30 VITALS — BP 132/80 | HR 87 | Ht 67.0 in | Wt 294.2 lb

## 2021-08-30 DIAGNOSIS — R4589 Other symptoms and signs involving emotional state: Secondary | ICD-10-CM | POA: Diagnosis not present

## 2021-08-30 DIAGNOSIS — E119 Type 2 diabetes mellitus without complications: Secondary | ICD-10-CM | POA: Diagnosis not present

## 2021-08-30 DIAGNOSIS — Z6841 Body Mass Index (BMI) 40.0 and over, adult: Secondary | ICD-10-CM

## 2021-08-30 DIAGNOSIS — F32A Depression, unspecified: Secondary | ICD-10-CM | POA: Insufficient documentation

## 2021-08-30 DIAGNOSIS — I10 Essential (primary) hypertension: Secondary | ICD-10-CM

## 2021-08-30 DIAGNOSIS — R5383 Other fatigue: Secondary | ICD-10-CM

## 2021-08-30 DIAGNOSIS — E782 Mixed hyperlipidemia: Secondary | ICD-10-CM

## 2021-08-30 DIAGNOSIS — E7800 Pure hypercholesterolemia, unspecified: Secondary | ICD-10-CM

## 2021-08-30 MED ORDER — CARVEDILOL 25 MG PO TABS
37.5000 mg | ORAL_TABLET | Freq: Two times a day (BID) | ORAL | 2 refills | Status: DC
  Filled 2021-08-30 (×2): qty 90, 30d supply, fill #0
  Filled 2021-09-27: qty 90, 30d supply, fill #1
  Filled 2021-11-02: qty 90, 30d supply, fill #2

## 2021-08-30 MED ORDER — TRULICITY 0.75 MG/0.5ML ~~LOC~~ SOAJ
0.7500 mg | SUBCUTANEOUS | 0 refills | Status: AC
  Filled 2021-08-30 – 2021-09-13 (×4): qty 2, 28d supply, fill #0

## 2021-08-30 MED ORDER — POTASSIUM CHLORIDE CRYS ER 20 MEQ PO TBCR
20.0000 meq | EXTENDED_RELEASE_TABLET | Freq: Two times a day (BID) | ORAL | 0 refills | Status: DC
  Filled 2021-08-30: qty 60, 30d supply, fill #0

## 2021-08-30 NOTE — Assessment & Plan Note (Signed)
Weight of 294 pounds today up 5 pounds from last visit.  Discussed behavioral changes in addition to starting GLP-1 agonist for combined benefit of weight loss and diabetic management.  Metformin not proven to be beneficial for her in the past.  Rx sent for Trulicity however will need prior Auth. ?

## 2021-08-30 NOTE — Assessment & Plan Note (Signed)
Blood pressure well controlled at 132/80.  Patient takes Coreg 37.5 mg twice daily, HCTZ 25 mg daily, losartan 100 mg daily, amlodipine 5 mg daily.  Refilled Coreg. ?

## 2021-08-30 NOTE — Assessment & Plan Note (Signed)
Continue atorvastatin 40 mg daily. Lipid panel today. 

## 2021-08-30 NOTE — Assessment & Plan Note (Addendum)
Without shortness of breath or cardiac etiology indicators.  Most recent CBC without signs of anemia.  Check TSH today.  Largely considering primary focuses depressed mood.  Discuss OSA/CPAP use at later visit as this may also be contributory. ?

## 2021-08-30 NOTE — Progress Notes (Signed)
?SUBJECTIVE:  ? ?CHIEF COMPLAINT / HPI:  ? ?Depression: states she is unhappy at her job. She is hungry all the time and tired. When she comes back from work she is exhausted. She believes that her job is the reason she is so tired. 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 She states she has been feeling this way for about 6 months. She's not where she wants to be in life and that she is not pushing herself to be who and where she wants to be. She lives with her niece and would love to have her own apartment. She has some family issues. She has started drinking more wine in the last month. She is drinking 3-4 glasses of wine every other day. Denies any abrupt thoughts of gambling or driving. Without SI/HI. ? ?PERTINENT  PMH / PSH: HTN, T2DM, OSA, insomnia, GERD, HLD ? ?OBJECTIVE:  ?BP 132/80   Pulse 87   Ht 5\' 7"  (1.702 m)   Wt 294 lb 3.2 oz (133.4 kg)   LMP 11/21/2012   SpO2 96%   BMI 46.08 kg/m?  ? ?General: NAD, pleasant, able to participate in exam ?Cardiac: RRR, no murmurs auscultated. ?Respiratory: CTAB, normal effort, no wheezes, rales or rhonchi ?Abdomen: soft, obese abdomen, grossly nontender ?Psych: low mood, normal affect ? ?ASSESSMENT/PLAN:  ?Depressed mood ?PHQ-9 score 14.  Suspect largely in part due to life circumstances and difficulty with weight.  Would benefit from counseling, discussed with [facility] behavioral therapist Dr. Hartford Poli who will schedule with patient in the near future.  Patient may also benefit from antidepressant, more likely Wellbutrin given fatigue as well.  We will focus on weight at this time.  Lifestyle goal by patient is to decrease/eliminate wine intake by next visit.  ? ?Hyperlipidemia ?Continue atorvastatin 40 mg daily.  Lipid panel today. ? ?Fatigue ?Without shortness of breath or cardiac etiology indicators.  Most recent CBC without signs of anemia.  Check TSH today.  Largely considering primary focuses  depressed mood.  Discuss OSA/CPAP use at later visit as this may also be contributory. ? ?Type 2 diabetes mellitus without complications ([place]) ?Last A1c 6.5 on 07/05/2021.  Attempts to prescribe Trulicity given patient has not had beneficial effects by metformin in the past.  See CPhT advised to me that patient will need active prescription for metformin in the last 365 days which she did have.  Prior Auth sent to office.  Patient meets clinical indication for GLP-1 given T2DM and morbid obesity with BMI of 46. ? ?Obesity ?Weight of 294 pounds today up 5 pounds from last visit.  Discussed behavioral changes in addition to starting GLP-1 agonist for combined benefit of weight loss and diabetic management.  Metformin not proven to be beneficial for her in the past.  Rx sent for Trulicity however will need prior Auth. ? ?Essential hypertension ?Blood pressure well controlled at 132/80.  Patient takes Coreg 37.5 mg twice daily, HCTZ 25 mg daily, losartan 100 mg daily, amlodipine 5 mg daily.  Refilled Coreg. ?  ?Orders Placed This Encounter  ?Procedures  ? TSH  ? Lipid Panel  ? ?Meds ordered this encounter  ?Medications  ? carvedilol (COREG) 25 MG tablet  ?  Sig: Take 1.5 tablets (37.5 mg total) by mouth 2 (two) times daily with a meal.  ?  Dispense:  90 tablet  ?  Refill:  2  ? potassium chloride SA (KLOR-CON M) 20 MEQ tablet  ?  Sig: Take 1 tablet (20 mEq total) by mouth 2 (two) times daily.  ?  Dispense:  60 tablet  ?  Refill:  0  ? Dulaglutide (TRULICITY) [id number] SOPN  ?  Sig: Inject 0.75 mg into the skin once a week.  ?  Dispense:  2 mL  ?  Refill:  0  ? ?Return in about 4 weeks (around 09/27/2021) for weight concerns and depression. ?Wells Guiles, DO ?08/30/2021, 3:36 PM ?PGY-1, [place] ? ?

## 2021-08-30 NOTE — Assessment & Plan Note (Addendum)
PHQ-9 score 14.  Suspect largely in part due to life circumstances and difficulty with weight.  Would benefit from counseling, discussed with [facility] behavioral therapist Dr. Hartford Poli who will schedule with patient in the near future.  Patient may also benefit from antidepressant, more likely Wellbutrin given fatigue as well.  We will focus on weight at this time.  Lifestyle goal by patient is to decrease/eliminate wine intake by next visit.  ?

## 2021-08-30 NOTE — Assessment & Plan Note (Signed)
Last A1c 6.5 on 07/05/2021.  Attempts to prescribe Trulicity given patient has not had beneficial effects by metformin in the past.  See CPhT advised to me that patient will need active prescription for metformin in the last 365 days which she did have.  Prior Auth sent to office.  Patient meets clinical indication for GLP-1 given T2DM and morbid obesity with BMI of 46. ?

## 2021-08-30 NOTE — Patient Instructions (Addendum)
It was great to see you today! Thank you for choosing [facility] for your primary care. Virginia Levine was seen for depressed mood and weight gain. ? ?Today we addressed: ?Weight gain/low mood/diabetes: I prescribed you Trulicity.  This to be taken once a week and on your follow-up visit we will discuss increasing the dosage.  I sent a message to our outpatient therapist who may contact you in the near future.  If not, we may consider counseling resources. ?Recall that our goal for next visit is to limit the wine intake and make small changes over time. ? ? ?Orders Placed This Encounter  ?Procedures  ? TSH  ? Lipid Panel  ? ?Meds ordered this encounter  ?Medications  ? carvedilol (COREG) 25 MG tablet  ?  Sig: Take 1.5 tablets (37.5 mg total) by mouth 2 (two) times daily with a meal.  ?  Dispense:  90 tablet  ?  Refill:  2  ? potassium chloride SA (KLOR-CON M) 20 MEQ tablet  ?  Sig: Take 1 tablet (20 mEq total) by mouth 2 (two) times daily.  ?  Dispense:  60 tablet  ?  Refill:  0  ? Dulaglutide (TRULICITY) 0.75 MG/0.5ML SOPN  ?  Sig: Inject 0.75 mg into the skin once a week.  ?  Dispense:  2 mL  ?  Refill:  0  ? ? ?We are checking some labs today. If they are abnormal, I will call you. If they are normal, I will send you a MyChart message (if it is active) or a letter in the mail. If you do not hear about your labs in the next 2 weeks, please call the office. ? ?You should return to our clinic Return in about 4 weeks (around 09/27/2021) for weight concerns and depression.. ? ?I recommend that you always bring your medications to each appointment as this makes it easy to ensure you are on the correct medications and helps [place] not miss refills when you need them. ? ?Please arrive 15 minutes before your appointment to ensure smooth check in process.  We appreciate your efforts in making this happen. ? ?Take care and seek immediate care sooner if you develop any concerns.  ? ?Thank you for allowing me to  participate in your care, ?Shelby Mattocks, DO ?08/30/2021, 3:04 PM ?PGY-1, [facility] ? ? ?

## 2021-08-31 ENCOUNTER — Other Ambulatory Visit (HOSPITAL_COMMUNITY): Payer: Self-pay

## 2021-08-31 ENCOUNTER — Telehealth: Payer: Self-pay | Admitting: *Deleted

## 2021-08-31 ENCOUNTER — Telehealth: Payer: Self-pay | Admitting: Psychology

## 2021-08-31 DIAGNOSIS — E782 Mixed hyperlipidemia: Secondary | ICD-10-CM

## 2021-08-31 LAB — HEPATIC FUNCTION PANEL
ALT: 45 IU/L — ABNORMAL HIGH (ref 0–32)
AST: 49 IU/L — ABNORMAL HIGH (ref 0–40)
Albumin: 5 g/dL — ABNORMAL HIGH (ref 3.8–4.9)
Alkaline Phosphatase: 115 IU/L (ref 44–121)
Bilirubin Total: 0.8 mg/dL (ref 0.0–1.2)
Bilirubin, Direct: 0.23 mg/dL (ref 0.00–0.40)
Total Protein: 7.7 g/dL (ref 6.0–8.5)

## 2021-08-31 LAB — LIPID PANEL
Chol/HDL Ratio: 3 ratio (ref 0.0–4.4)
Cholesterol, Total: 157 mg/dL (ref 100–199)
HDL: 53 mg/dL (ref >39)
LDL Chol Calc (NIH): 77 mg/dL (ref 0–99)
Triglycerides: 157 mg/dL — ABNORMAL HIGH (ref 0–149)
VLDL Cholesterol Cal: 27 mg/dL (ref 5–40)

## 2021-08-31 LAB — TSH: TSH: 0.72 u[IU]/mL (ref 0.450–4.500)

## 2021-08-31 MED ORDER — ROSUVASTATIN CALCIUM 20 MG PO TABS
20.0000 mg | ORAL_TABLET | Freq: Every day | ORAL | 3 refills | Status: DC
  Filled 2021-08-31: qty 90, 90d supply, fill #0
  Filled 2021-11-26: qty 90, 90d supply, fill #1
  Filled 2021-11-26: qty 47, 47d supply, fill #1
  Filled 2021-11-26: qty 43, 43d supply, fill #1
  Filled 2022-02-25: qty 90, 90d supply, fill #2
  Filled 2022-05-21: qty 90, 90d supply, fill #3

## 2021-08-31 NOTE — Telephone Encounter (Signed)
Called pt. Pt reported she would call back Monday to schedule a BH appt. ? ?Royetta Asal, PhD., LMFT ? ?

## 2021-08-31 NOTE — Telephone Encounter (Signed)
-----   Message from Liliane Shi, [place] sent at 08/31/2021  1:30 PM EDT ----- ?LFTs mildly elevated.  Reviewed labs from primary care obtained recently.  LDL above 70. ?Goal LDL <70 ?PLAN:  ?-DC atorvastatin ?-Start rosuvastatin 20 mg daily ?-Lipids, LFTs 3 months ?Richardson Dopp, PA-C    ?08/31/2021 1:26 PM   ?

## 2021-09-04 ENCOUNTER — Other Ambulatory Visit (HOSPITAL_COMMUNITY): Payer: Self-pay

## 2021-09-04 ENCOUNTER — Telehealth: Payer: Self-pay

## 2021-09-04 NOTE — Telephone Encounter (Signed)
A Prior Authorization was initiated for this patients TRULICITY through CoverMyMeds.  ? ?Key:  [id number] ? ?

## 2021-09-04 NOTE — Telephone Encounter (Signed)
Prior Auth for patients medication TRULICITY approved by [facility] from 09/04/21 to 09/04/22. ? ?Key: [id number] ? ?Test claim ran through [facility] showed $25 copay for 28 day supply. ? ?Patients pharmacy notified. ? ?

## 2021-09-12 ENCOUNTER — Other Ambulatory Visit (HOSPITAL_COMMUNITY): Payer: Self-pay

## 2021-09-13 ENCOUNTER — Other Ambulatory Visit (HOSPITAL_COMMUNITY): Payer: Self-pay

## 2021-09-23 NOTE — Progress Notes (Signed)
?  SUBJECTIVE:  ? ?CHIEF COMPLAINT / HPI:  ? ?[id number]: Trulicity prior [place] approved. She just got her Trulicity pen and is planning to start in the next 1 to 2 days. ? ?Depressed mood: Has not followed up about [facility] appointment with Dr. Hartford Poli. Her niece's lease is up in a month and that is causing her extra stress in her life  as she needs to figure out life situations. Her goal since last visit was to decrease her wine intake. She did drink over the weekend because she was off and was feeling depressed. On Saturday, she drank 2 bottles. It made her feel alright but did not change her thoughts on her current life and being sad and not getting as much sleep. She did not drink on Sunday.  ? ?Chest pain: Patient notes that she gets lower chest pain that is bilateral and is bandlike across her lower chest (below her breasts) to her ribs that lasts for 15 minutes after she takes her several blood pressure medications in the morning (amlodipine, Coreg, HCTZ, losartan).  Describes the pain as dull and he notes that it always happens at this time and is predictable.  She is unsure if anxiety could be a contributing factor.  She notes this has been present for months to years and does not come on at any other times including when she is active.  She intends on discussing this with the cardiologist and her next appointment.  She notes that it could not be her statin as she takes that at night. ? ?PERTINENT  PMH / PSH: HTN, T2DM, OSA, insomnia, GERD, HLD ? ?OBJECTIVE:  ?BP 136/84   Pulse 90   Ht 5\' 7"  (1.702 m)   Wt 132.5 kg   LMP 11/21/2012   SpO2 97%   BMI 45.73 kg/m?  ? ?General: NAD, pleasant, able to participate in exam ?Cardiac: RRR, no murmurs auscultated. ?Respiratory: CTAB, normal effort, no wheezes, rales or rhonchi ?MSK: No tenderness to palpation of the anterior lateral ribs 5 through 7 ?Psych: Normal affect and mood ? ?ASSESSMENT/PLAN:  ?Type 2 diabetes mellitus without complications ([place]) ?Start  Trulicity.  Reeducated on proper use of Trulicity.  Last A1c 6.5 on 07/05/2021.  Recheck A1c and consider increasing dosage of Trulicity at follow-up in 1 month. ? ?Depressed mood ?PHQ 9 score 11 with negative question 9. No headway made on decreasing wine intake at this time. Advised patient to set up appointment with Dr. Hartford Poli.  Most affected currently by living status which she is working on improving.   ? ?Chest pain, atypical ?Atypical chest pain unlikely to be cardiac in nature given the predictability of at rest time period and chronicity.  Unsure why this may be correlated to taking blood pressure medications in particular.  Cardiopulmonary and MSK exam unremarkable.  No EKG at this time.  Advised to space out blood pressure medications to pinpoint if 1 particular medication may be causing this.  May consider GERD in the future although would likely experience symptoms at other times such as eating. ?  ?Return in about 4 weeks (around 10/22/2021) for T2DM/obesity follow-up. ?Wells Guiles, DO ?09/25/2021, 1:15 PM ?PGY-1, [place] ? ?

## 2021-09-24 ENCOUNTER — Encounter: Payer: Self-pay | Admitting: Student

## 2021-09-24 ENCOUNTER — Ambulatory Visit (INDEPENDENT_AMBULATORY_CARE_PROVIDER_SITE_OTHER): Payer: No Typology Code available for payment source | Admitting: Student

## 2021-09-24 VITALS — BP 136/84 | HR 90 | Ht 67.0 in | Wt 292.0 lb

## 2021-09-24 DIAGNOSIS — E119 Type 2 diabetes mellitus without complications: Secondary | ICD-10-CM

## 2021-09-24 DIAGNOSIS — R0789 Other chest pain: Secondary | ICD-10-CM | POA: Diagnosis not present

## 2021-09-24 DIAGNOSIS — Z6841 Body Mass Index (BMI) 40.0 and over, adult: Secondary | ICD-10-CM

## 2021-09-24 DIAGNOSIS — R4589 Other symptoms and signs involving emotional state: Secondary | ICD-10-CM

## 2021-09-24 NOTE — Patient Instructions (Signed)
It was great to see you today! Thank you for choosing [facility] for your primary care. Happy Virginia Levine was seen for T2DM and depressed mood. ? ?Today we addressed: ?T2DM/obesity: Please start Trulicity pen you are able.  Keep an eye out for side effects such as nausea and abdominal pain.  If you tolerate this well, we may increase the dose in 1 month at a follow-up visit. ?Depressed mood: I hope you are able to figure out your new situation soon.  Please contact Dr. Jolyne Loa so that you may have a behavioral therapist to continue to speak with for your concerns. ?Lower chest pain: I advise spacing out your blood pressure medications every 15 minutes and taking them all at once.  Do not have concern at this time for acute cardiac problems and you may be able to discuss this further with cardiology with your upcoming appointment. ? ?You should return to our clinic Return in about 4 weeks (around 10/22/2021) for T2DM/obesity follow-up.. ? ?I recommend that you always bring your medications to each appointment as this makes it easy to ensure you are on the correct medications and helps [place] not miss refills when you need them. ? ?Please arrive 15 minutes before your appointment to ensure smooth check in process.  We appreciate your efforts in making this happen. ? ?Take care and seek immediate care sooner if you develop any concerns.  ? ?Thank you for allowing me to participate in your care, ?Shelby Mattocks, DO ?09/24/2021, 4:52 PM ?PGY-1, [facility] ? ? ?

## 2021-09-25 DIAGNOSIS — R0789 Other chest pain: Secondary | ICD-10-CM | POA: Insufficient documentation

## 2021-09-25 NOTE — Assessment & Plan Note (Signed)
Start Trulicity.  Reeducated on proper use of Trulicity.  Last A1c 6.5 on 07/05/2021.  Recheck A1c and consider increasing dosage of Trulicity at follow-up in 1 month. ?

## 2021-09-25 NOTE — Assessment & Plan Note (Addendum)
Atypical chest pain unlikely to be cardiac in nature given the predictability of at rest time period and chronicity.  Unsure why this may be correlated to taking blood pressure medications in particular.  Cardiopulmonary and MSK exam unremarkable.  No EKG at this time.  Advised to space out blood pressure medications to pinpoint if 1 particular medication may be causing this.  May consider GERD in the future although would likely experience symptoms at other times such as eating. ?

## 2021-09-25 NOTE — Assessment & Plan Note (Signed)
PHQ 9 score 11 with negative question 9. No headway made on decreasing wine intake at this time. Advised patient to set up appointment with Dr. Shawnee Knapp.  Most affected currently by living status which she is working on improving.   ?

## 2021-09-27 ENCOUNTER — Encounter (HOSPITAL_COMMUNITY): Payer: Self-pay | Admitting: Physician Assistant

## 2021-09-27 ENCOUNTER — Other Ambulatory Visit: Payer: No Typology Code available for payment source

## 2021-09-27 ENCOUNTER — Ambulatory Visit: Payer: No Typology Code available for payment source | Admitting: Podiatry

## 2021-09-27 ENCOUNTER — Ambulatory Visit (HOSPITAL_COMMUNITY): Payer: No Typology Code available for payment source | Attending: Cardiology

## 2021-09-27 ENCOUNTER — Other Ambulatory Visit (HOSPITAL_COMMUNITY): Payer: Self-pay

## 2021-09-27 ENCOUNTER — Other Ambulatory Visit: Payer: Self-pay

## 2021-09-27 DIAGNOSIS — I10 Essential (primary) hypertension: Secondary | ICD-10-CM

## 2021-09-28 ENCOUNTER — Other Ambulatory Visit: Payer: Self-pay | Admitting: Sports Medicine

## 2021-09-28 ENCOUNTER — Other Ambulatory Visit (HOSPITAL_COMMUNITY): Payer: Self-pay

## 2021-09-28 MED ORDER — POTASSIUM CHLORIDE CRYS ER 20 MEQ PO TBCR
20.0000 meq | EXTENDED_RELEASE_TABLET | Freq: Two times a day (BID) | ORAL | 3 refills | Status: DC
  Filled 2021-09-28: qty 60, 30d supply, fill #0

## 2021-10-01 ENCOUNTER — Other Ambulatory Visit (HOSPITAL_COMMUNITY): Payer: Self-pay

## 2021-10-04 ENCOUNTER — Ambulatory Visit: Payer: No Typology Code available for payment source | Admitting: Podiatry

## 2021-10-07 NOTE — Progress Notes (Deleted)
Cardiology Office Note:    Date:  10/07/2021   ID:  Virginia Levine, DOB 08/11/1965, MRN [id number]  PCP:  Shelby Mattocks, DO  [facility] Cardiologist:  Meriam Sprague, MD  [facility] Electrophysiologist:  None   Referring MD: Shelby Mattocks, DO    History of Present Illness:    Virginia Levine is a 57 y.o. female with a hx of HTN, HLD, OSA on CPAP, obesity and DMII who presents to clinic for follow-up.   Was initially seen in clinic on 07/2020 where she was having back and chest tightness. We recommended coronary CTA and TTE but this was not performed.   Had follow-up with Tereso Newcomer 07/2021 and she ultimately underwent coronary CTA on 07/23/21 which showed mild-to-moderate disease in LAD, D1, Lcx, OM1. FFR negative. Ca score 601 (99%).   Today, ***   Family history: Father with MI, Brother with ESRD and possible heart issues, mother with leukemia. Sisters with DMII.    Past Medical History:  Diagnosis Date   Coronary artery disease 07/24/2021   CCTA 2/23: CAC score 601 (99th percentile); pLAD 25-49, mLAD 1-24, pD1 25-49, pLCx 25-49, pOM1 1-24; FFR negative; Asc Aorta 3.6 cm; hepatic steatosis    Hyperlipidemia    Hypertension    Obesity    OSA (obstructive sleep apnea)    Pre-diabetes    Tobacco use     Past Surgical History:  Procedure Laterality Date   LEEP  2018    Current Medications: No outpatient medications have been marked as taking for the 10/09/21 encounter (Appointment) with Meriam Sprague, MD.     Allergies:   Lisinopril   Social History   Socioeconomic History   Marital status: Single    Spouse name: Not on file   Number of children: 0   Years of education: Not on file   Highest education level: Some college, no degree  Occupational History   Not on file  Tobacco Use   Smoking status: Light Smoker    Packs/day: 0.15    Types: Cigarettes   Smokeless tobacco: Never   Tobacco comments:    smokes with drinking more (once a  month), 5-6 cigarettes a day  Vaping Use   Vaping Use: Never used  Substance and Sexual Activity   Alcohol use: Yes    Comment: occasional   Drug use: No   Sexual activity: Never  Other Topics Concern   Not on file  Social History Narrative   Not on file   Social Determinants of Health   Financial Resource Strain: Not on file  Food Insecurity: Not on file  Transportation Needs: Not on file  Physical Activity: Not on file  Stress: Not on file  Social Connections: Not on file     Family History: The patient's family history includes Aneurysm in her sister and sister; Diabetes in her maternal grandmother and mother; Drug abuse in her sister; Heart attack in her father; Hypertension in her brother; Kidney failure in her brother; Leukemia (age of onset: 2) in her mother.  ROS:   Please see the history of present illness.    Review of Systems  Constitutional:  Positive for malaise/fatigue. Negative for chills, fever and weight loss.  HENT:  Negative for congestion.   Eyes:  Negative for blurred vision and double vision.  Respiratory:  Positive for shortness of breath.   Cardiovascular:  Positive for chest pain and leg swelling. Negative for palpitations, orthopnea, claudication and PND.  Gastrointestinal:  Negative  for heartburn and melena.  Genitourinary:  Negative for flank pain and urgency.  Musculoskeletal:  Positive for back pain and myalgias.  Neurological:  Negative for dizziness and loss of consciousness.  Endo/Heme/Allergies:  Negative for polydipsia.  Psychiatric/Behavioral:  Negative for substance abuse.    EKGs/Labs/Other Studies Reviewed:    The following studies were reviewed today: Coronary CTA 07/2021: FINDINGS: Non-cardiac: See separate report from [facility]. No significant findings on limited lung and soft tissue windows.   Calcium Score: 2 vessel coronary calcium noted   LM: 0   RCA: 0   LAD: 484   LCX: 117   Total: 601 which is 99  th percentile for age/sex   Coronary Arteries: Right dominant with no anomalies   LM: Normal   LAD: 25-49% calcified plaque proximally 1-24% calcified plaque mid vessel   D1: 25-49% calcified proximal stenosis   D2: Normal   Circumflex: 25-49% calcified plaque proximally   OM1: 1-24% calcified plaque proximally   OM2: Normal   RCA: Normal   PDA: Normal   PLA: Normal   IMPRESSION: 1. Coronary calcium score 601 which is 99 th percentile for age/sex   2.  Normal ascending thoracic aorta 3.6 cm   3.  CAD RADS 2 Poor opacification of vessels Study sent for FFR CT  EKG:  EKG is ordered today.  The ekg ordered today demonstrates NSR with HR 87, single PVC  Recent Labs: 06/06/2021: BUN 11; Hemoglobin 13.7; Platelets 212; Potassium 3.3; Sodium 141 07/10/2021: NT-Pro BNP 289 07/23/2021: Creatinine, Ser 0.80 08/30/2021: ALT 45; TSH 0.720  Recent Lipid Panel    Component Value Date/Time   CHOL 157 08/30/2021 1548   TRIG 157 (H) 08/30/2021 1548   HDL 53 08/30/2021 1548   CHOLHDL 3.0 08/30/2021 1548   CHOLHDL 5.9 06/30/2012 1626   VLDL 30 06/30/2012 1626   LDLCALC 77 08/30/2021 1548     Risk Assessment/Calculations:       Physical Exam:    VS:  LMP 11/21/2012     Wt Readings from Last 3 Encounters:  09/24/21 292 lb (132.5 kg)  08/30/21 294 lb 3.2 oz (133.4 kg)  08/02/21 295 lb (133.8 kg)     GEN:  Well nourished, well developed in no acute distress HEENT: Normal NECK: No JVD; No carotid bruits CARDIAC: RRR, no murmurs, rubs, gallops RESPIRATORY:  Clear to auscultation without rales, wheezing or rhonchi  ABDOMEN: Soft, non-tender, non-distended MUSCULOSKELETAL:  Trace edema to the shins; warm SKIN: Warm and dry NEUROLOGIC:  Alert and oriented x 3 PSYCHIATRIC:  Normal affect   ASSESSMENT:    No diagnosis found.  PLAN:    In order of problems listed above:  #CAD: Coronary CTA with 25-40% LAD, D1, Lcx, and 1-24% OM1. Ca score 601 (99%). Currently  *** -Continue ASA 81mg  daily -Continue crestor 20mg  daily -Continue losartan 1000mg  daily -Continue coreg 25mg  BID  #HTN: Well controlled. -Continue amlodipine 5mg  -Continue coreg 25mg  BID -Continue losartan 100mg  daily  #HLD: -Continue crestor 20mg  daily  #DMII: -Continue metformin -Continue trulicity  #Morbid Obesity: Extensive counseling provided today about diet and exercise. Patient is very motivated to lose weight. -Will refer to weight loss clinic -Discussed Mediterranean diet at length -Increase daily exercise as tolerated  #Tobacco Abuse: Extensive counseling today about tobacco cessation and patient is motivated to quit. Did not respond to lozenges or wellbutrin in the past, but nicotine patches have helped. Will do trial of nicotine patches and follow-up. -Start nicotine patches and continue  to counsel about tobacco cessation     Medication Adjustments/Labs and Tests Ordered: Current medicines are reviewed at length with the patient today.  Concerns regarding medicines are outlined above.  No orders of the defined types were placed in this encounter.  No orders of the defined types were placed in this encounter.   There are no Patient Instructions on file for this visit.    Signed, Meriam SpragueHeather E Edin Kon, MD  10/07/2021 4:25 PM    [facility]

## 2021-10-09 ENCOUNTER — Ambulatory Visit: Payer: No Typology Code available for payment source | Admitting: Cardiology

## 2021-10-09 NOTE — Progress Notes (Incomplete)
?Cardiology Office Note:   ? ?Date:  10/09/2021  ? ?ID:  Virginia LogeMarian Levine, DOB 10/16/1965, MRN [id number] ? ?PCP:  Shelby Mattocksahbura, Anton, DO  ?[facility] Cardiologist:  Meriam SpragueHeather E Pemberton, MD  ?[facility] Electrophysiologist:  None  ? ?Referring MD: Shelby Mattocksahbura, Anton, DO  ? ? ?History of Present Illness:   ? ?Virginia LogeMarian Thelander is a 56 y.o. female with a hx of HTN, HLD, OSA on CPAP, obesity and DMII who presents to clinic for follow-up.  ? ?Was initially seen in clinic on 07/2020 where she was having back and chest tightness. We recommended coronary CTA and TTE but this was not performed.  ? ?Had follow-up with Tereso NewcomerScott Weaver 07/2021 and she ultimately underwent coronary CTA on 07/23/21 which showed mild-to-moderate disease in LAD, D1, Lcx, OM1. FFR negative. Ca score 601 (99%).  ? ? ?Family history: Father with MI, Brother with ESRD and possible heart issues, mother with leukemia. Sisters with DMII.  ? ? ? ?Today, *** ? ?The patient denies chest pain***, shortness of breath***, nocturnal dyspnea***, orthopnea*** or peripheral edema***.  There have been no palpitations***, lightheadedness*** or syncope***.  Complains of ***.  ? ? ?Past Medical History:  ?Diagnosis Date  ? Coronary artery disease 07/24/2021  ? CCTA 2/23: CAC score 601 (99th percentile); pLAD 25-49, mLAD 1-24, pD1 25-49, pLCx 25-49, pOM1 1-24; FFR negative; Asc Aorta 3.6 cm; hepatic steatosis   ? Hyperlipidemia   ? Hypertension   ? Obesity   ? OSA (obstructive sleep apnea)   ? Pre-diabetes   ? Tobacco use   ? ? ?Past Surgical History:  ?Procedure Laterality Date  ? LEEP  2018  ? ? ?Current Medications: ?No outpatient medications have been marked as taking for the 10/09/21 encounter (Appointment) with Meriam SpraguePemberton, Heather E, MD.  ?  ? ?Allergies:   Lisinopril  ? ?Social History  ? ?Socioeconomic History  ? Marital status: Single  ?  Spouse name: Not on file  ? Number of children: 0  ? Years of education: Not on file  ? Highest education level: Some college, no degree   ?Occupational History  ? Not on file  ?Tobacco Use  ? Smoking status: Light Smoker  ?  Packs/day: 0.15  ?  Types: Cigarettes  ? Smokeless tobacco: Never  ? Tobacco comments:  ?  smokes with drinking more (once a month), 5-6 cigarettes a day  ?Vaping Use  ? Vaping Use: Never used  ?Substance and Sexual Activity  ? Alcohol use: Yes  ?  Comment: occasional  ? Drug use: No  ? Sexual activity: Never  ?Other Topics Concern  ? Not on file  ?Social History Narrative  ? Not on file  ? ?Social Determinants of Health  ? ?Financial Resource Strain: Not on file  ?Food Insecurity: Not on file  ?Transportation Needs: Not on file  ?Physical Activity: Not on file  ?Stress: Not on file  ?Social Connections: Not on file  ?  ? ?Family History: ?The patient's family history includes Aneurysm in her sister and sister; Diabetes in her maternal grandmother and mother; Drug abuse in her sister; Heart attack in her father; Hypertension in her brother; Kidney failure in her brother; Leukemia (age of onset: 263) in her mother. ? ?ROS:   ?Please see the history of present illness. ?*** ?All other systems are reviewed and negative.   ? ?EKGs/Labs/Other Studies Reviewed:   ? ?The following studies were reviewed today: ?Coronary CTA 07/2021: ?FINDINGS: ?Non-cardiac: [facility] No ?  significant findings on limited lung and soft tissue windows. ?  ?Calcium Score: 2 vessel coronary calcium noted ?  ?LM: 0 ?  ?RCA: 0 ?  ?LAD: 484 ?  ?LCX: 117 ?  ?Total: 601 which is 99 th percentile for age/sex ?  ?Coronary Arteries: Right dominant with no anomalies ?  ?LM: Normal ?  ?LAD: 25-49% calcified plaque proximally 1-24% calcified plaque mid ?vessel ?  ?D1: 25-49% calcified proximal stenosis ?  ?D2: Normal ?  ?Circumflex: 25-49% calcified plaque proximally ?  ?OM1: 1-24% calcified plaque proximally ?  ?OM2: Normal ?  ?RCA: Normal ?  ?PDA: Normal ?  ?PLA: Normal ?  ?IMPRESSION: ?1. Coronary calcium score 601 which is 99 th  percentile for age/sex ?  ?2.  Normal ascending thoracic aorta 3.6 cm ?  ?3.  CAD RADS 2 Poor opacification of vessels Study sent for FFR CT ? ?EKG:  10/09/2021 EKG: Rates *** Sinus Rhythm *** ? ?*** EKG is ordered today.  The ekg ordered today demonstrates NSR with HR 87, single PVC ? ?Recent Labs: ?06/06/2021: BUN 11; Hemoglobin 13.7; Platelets 212; Potassium 3.3; Sodium 141 ?07/10/2021: NT-Pro BNP 289 ?07/23/2021: Creatinine, Ser 0.80 ?08/30/2021: ALT 45; TSH 0.720  ?Recent Lipid Panel ?   ?Component Value Date/Time  ? CHOL 157 08/30/2021 1548  ? TRIG 157 (H) 08/30/2021 1548  ? HDL 53 08/30/2021 1548  ? CHOLHDL 3.0 08/30/2021 1548  ? CHOLHDL 5.9 06/30/2012 1626  ? VLDL 30 06/30/2012 1626  ? LDLCALC 77 08/30/2021 1548  ? ? ? ?Risk Assessment/Calculations:   ?  ? ? ?Physical Exam:   ? ?VS:  LMP 11/21/2012    ? ?Wt Readings from Last 3 Encounters:  ?09/24/21 292 lb (132.5 kg)  ?08/30/21 294 lb 3.2 oz (133.4 kg)  ?08/02/21 295 lb (133.8 kg)  ?  ? ?GEN:  Well nourished, well developed in no acute distress ?HEENT: Normal ?NECK: No JVD; No carotid bruits ?CARDIAC: RRR, no murmurs, rubs, gallops ?RESPIRATORY:  Clear to auscultation without rales, wheezing or rhonchi  ?ABDOMEN: Soft, non-tender, non-distended ?MUSCULOSKELETAL:  ***Trace edema to the shins; warm ?SKIN: Warm and dry ?NEUROLOGIC:  Alert and oriented x 3 ?PSYCHIATRIC:  Normal affect  ? ?ASSESSMENT:   ? ?No diagnosis found. ? ?PLAN:   ? ?In order of problems listed above: ? ?*** ? ?#CAD: ?Coronary CTA with 25-40% LAD, D1, Lcx, and 1-24% OM1. Ca score 601 (99%). Currently *** ?-Continue ASA 81mg  daily ?-Continue crestor 20mg  daily ?-Continue losartan 1000mg  daily ?-Continue coreg 25mg  BID ? ?#HTN: ?Well controlled. ?-Continue amlodipine 5mg  ?-Continue coreg 25mg  BID ?-Continue losartan 100mg  daily ? ?#HLD: ?-Continue crestor 20mg  daily ? ?#DMII: ?-Continue metformin ?-Continue trulicity ? ?#Morbid Obesity: ?Extensive counseling provided today about diet and exercise.  Patient is very motivated to lose weight. ?-Will refer to weight loss clinic ?-Discussed Mediterranean diet at length ?-Increase daily exercise as tolerated ? ?#Tobacco Abuse: ?Extensive counseling today about tobacco cessation and patient is motivated to quit. Did not respond to lozenges or wellbutrin in the past, but nicotine patches have helped. Will do trial of nicotine patches and follow-up. ?-Start nicotine patches and continue to counsel about tobacco cessation ? ? ?  ?Medication Adjustments/Labs and Tests Ordered: ?Current medicines are reviewed at length with the patient today.  Concerns regarding medicines are outlined above.  ?No orders of the defined types were placed in this encounter. ? ?No orders of the defined types were placed in this encounter. ? ? ?There are no Patient  Instructions on file for this visit. ?  ?F/U in *** weeks/months/year ? ?I,Tinashe Williams,acting as a Neurosurgeon for Meriam Sprague, MD.,have documented all relevant documentation on the behalf of Meriam Sprague, MD,as directed by  Meriam Sprague, MD while in the presence of Meriam Sprague, MD.  ? ?*** ?

## 2021-10-10 ENCOUNTER — Encounter: Payer: Self-pay | Admitting: Podiatry

## 2021-10-10 ENCOUNTER — Ambulatory Visit (INDEPENDENT_AMBULATORY_CARE_PROVIDER_SITE_OTHER): Payer: No Typology Code available for payment source | Admitting: Podiatry

## 2021-10-10 DIAGNOSIS — M79674 Pain in right toe(s): Secondary | ICD-10-CM

## 2021-10-10 DIAGNOSIS — B351 Tinea unguium: Secondary | ICD-10-CM | POA: Diagnosis not present

## 2021-10-10 DIAGNOSIS — M79675 Pain in left toe(s): Secondary | ICD-10-CM

## 2021-10-11 NOTE — Progress Notes (Signed)
Subjective:  ? ?Patient ID: Virginia Levine, female   DOB: 56 y.o.   MRN: [id number]  ? ?HPI ?Patient presents with elongated nailbeds 1-5 both feet that are very thickened and become painful and she cannot cut and do well with debridement ? ? ?ROS ? ? ?   ?Objective:  ?Physical Exam  ?Neurovascular status found to be intact muscle strength found to be adequate patient is noted to have thick yellow brittle nailbeds 1-5 both feet that are painful when pressed and impossible for her to cut ? ?   ?Assessment:  ?Mycotic nail infection 1-5 both feet with pain ? ?   ?Plan:  ?Reviewed condition the only other thing we can do is removal of nails 1 point in future she does not want this currently and today debridement of painful nailbeds 1-5 both feet accomplished ?   ? ? ?

## 2021-10-18 ENCOUNTER — Ambulatory Visit (HOSPITAL_COMMUNITY): Payer: No Typology Code available for payment source

## 2021-10-25 ENCOUNTER — Ambulatory Visit (HOSPITAL_COMMUNITY)
Admission: RE | Admit: 2021-10-25 | Discharge: 2021-10-25 | Disposition: A | Discharge status: Home / Self Care | Payer: No Typology Code available for payment source | Source: Ambulatory Visit | Attending: Physician Assistant | Admitting: Physician Assistant

## 2021-10-25 ENCOUNTER — Other Ambulatory Visit: Payer: Self-pay | Admitting: *Deleted

## 2021-10-25 ENCOUNTER — Other Ambulatory Visit (HOSPITAL_COMMUNITY): Payer: Self-pay

## 2021-10-25 DIAGNOSIS — I10 Essential (primary) hypertension: Secondary | ICD-10-CM | POA: Diagnosis not present

## 2021-10-25 DIAGNOSIS — I517 Cardiomegaly: Secondary | ICD-10-CM | POA: Diagnosis not present

## 2021-10-25 DIAGNOSIS — R0600 Dyspnea, unspecified: Secondary | ICD-10-CM | POA: Diagnosis present

## 2021-10-25 DIAGNOSIS — E782 Mixed hyperlipidemia: Secondary | ICD-10-CM

## 2021-10-25 DIAGNOSIS — R0609 Other forms of dyspnea: Secondary | ICD-10-CM | POA: Diagnosis not present

## 2021-10-25 DIAGNOSIS — I340 Nonrheumatic mitral (valve) insufficiency: Secondary | ICD-10-CM | POA: Insufficient documentation

## 2021-10-25 MED ORDER — HYDROCHLOROTHIAZIDE 25 MG PO TABS
25.0000 mg | ORAL_TABLET | Freq: Every day | ORAL | 2 refills | Status: DC
  Filled 2021-10-25: qty 90, 90d supply, fill #0
  Filled 2022-01-17: qty 90, 90d supply, fill #1
  Filled 2022-04-16: qty 90, 90d supply, fill #2

## 2021-10-25 NOTE — Progress Notes (Signed)
  Echocardiogram 2D Echocardiogram has been performed.  Virginia Levine F 10/25/2021, 2:53 PM

## 2021-10-26 ENCOUNTER — Other Ambulatory Visit: Payer: Self-pay

## 2021-10-26 ENCOUNTER — Encounter: Payer: Self-pay | Admitting: Student

## 2021-10-26 DIAGNOSIS — I10 Essential (primary) hypertension: Secondary | ICD-10-CM

## 2021-10-26 LAB — ECHOCARDIOGRAM COMPLETE
AR max vel: 2.05 cm2
AV Area VTI: 2.08 cm2
AV Area mean vel: 1.94 cm2
AV Mean grad: 5 mmHg
AV Peak grad: 10.2 mmHg
Ao pk vel: 1.6 m/s
Area-P 1/2: 3.23 cm2
S' Lateral: 3.1 cm

## 2021-10-27 ENCOUNTER — Encounter: Payer: Self-pay | Admitting: Physician Assistant

## 2021-10-27 DIAGNOSIS — I77810 Thoracic aortic ectasia: Secondary | ICD-10-CM

## 2021-10-27 HISTORY — DX: Thoracic aortic ectasia: I77.810

## 2021-10-30 ENCOUNTER — Telehealth: Payer: Self-pay | Admitting: *Deleted

## 2021-10-30 ENCOUNTER — Other Ambulatory Visit (HOSPITAL_COMMUNITY): Payer: Self-pay

## 2021-10-30 DIAGNOSIS — I7789 Other specified disorders of arteries and arterioles: Secondary | ICD-10-CM

## 2021-10-30 MED ORDER — POTASSIUM CHLORIDE CRYS ER 20 MEQ PO TBCR
20.0000 meq | EXTENDED_RELEASE_TABLET | Freq: Two times a day (BID) | ORAL | 3 refills | Status: DC
  Filled 2021-10-30: qty 60, 30d supply, fill #0
  Filled 2021-12-12 – 2021-12-21 (×2): qty 60, 30d supply, fill #1
  Filled 2022-01-17: qty 60, 30d supply, fill #2
  Filled 2022-02-25: qty 60, 30d supply, fill #3

## 2021-10-30 MED ORDER — TRULICITY 0.75 MG/0.5ML ~~LOC~~ SOPN
0.7500 mg | PEN_INJECTOR | SUBCUTANEOUS | 0 refills | Status: AC
Start: 2021-10-30 — End: 2021-11-21
  Filled 2021-10-30: qty 2, 28d supply, fill #0

## 2021-10-30 MED ORDER — LOSARTAN POTASSIUM 100 MG PO TABS
100.0000 mg | ORAL_TABLET | Freq: Every day | ORAL | 1 refills | Status: DC
  Filled 2021-10-30: qty 90, 90d supply, fill #0
  Filled 2022-01-17: qty 90, 90d supply, fill #1

## 2021-10-30 NOTE — Telephone Encounter (Signed)
-----   Message from Liliane Shi, PA-C sent at 10/27/2021 11:58 AM EDT ----- EF normal.  Mod LVH.   Mild diastolic dysfunction. Mild MR.  Mild dilation of aorta. LVH likely related to hypertension.  Most recent BP in the office was optimal.  Diastolic dysfunction can lead to fluid gains and shortness of breath but recent BNP was not indicative of this.  Would continue current dose of HCTZ.  Will need to repeat an echocardiogram in 1 year to look at aorta again. PLAN:  - Continue current medications/treatment plan and follow up as scheduled.  - Repeat echocardiogram 1 year (dilated aorta) - Send copy to PCP Richardson Dopp, PA-C    10/27/2021 11:48 AM

## 2021-10-31 ENCOUNTER — Telehealth: Payer: Self-pay | Admitting: *Deleted

## 2021-10-31 DIAGNOSIS — Z79899 Other long term (current) drug therapy: Secondary | ICD-10-CM

## 2021-10-31 NOTE — Telephone Encounter (Signed)
-----   Message from Beatrice Lecher, PA-C sent at 10/30/2021  5:58 PM EDT ----- Get a BMET, Mg2+.  She has an appt with Manson Passey, PA-C coming up soon. It can be drawn at that visit if that is easier for her. Tereso Newcomer, PA-C    10/30/2021 5:58 PM

## 2021-11-01 ENCOUNTER — Other Ambulatory Visit (HOSPITAL_COMMUNITY): Payer: Self-pay

## 2021-11-02 ENCOUNTER — Other Ambulatory Visit (HOSPITAL_COMMUNITY): Payer: Self-pay

## 2021-11-05 ENCOUNTER — Telehealth: Payer: Self-pay | Admitting: Physician Assistant

## 2021-11-05 NOTE — Telephone Encounter (Signed)
Returned the call to pt.  She was reminded that we ordered a BMET & MAG 10/31/21, when we discussed her Echocardiogram results and she was having some symptoms.  Pt is aware that she can get it done at her visit 1:30.  Pt was gracious for the call back.

## 2021-11-05 NOTE — Telephone Encounter (Signed)
Patient wanted to know if she still needs to have labs done before her appointment Thursday 11/08/21. She was under the impression she just had labs done recently. Please advise

## 2021-11-06 ENCOUNTER — Encounter: Payer: Self-pay | Admitting: *Deleted

## 2021-11-08 ENCOUNTER — Other Ambulatory Visit: Payer: No Typology Code available for payment source

## 2021-11-08 ENCOUNTER — Ambulatory Visit: Payer: No Typology Code available for payment source | Admitting: Physician Assistant

## 2021-11-21 NOTE — Progress Notes (Deleted)
Cardiology Office Note:    Date:  11/21/2021   ID:  Virginia Levine, DOB 1965-07-18, MRN [id number]  PCP:  Shelby Mattocks, DO   [facility] Providers Cardiologist:  Meriam Sprague, MD { Click to update primary MD,subspecialty MD or APP then REFRESH:1}    Referring MD: Shelby Mattocks, DO   Chief Complaint: ***  History of Present Illness:    Virginia Levine is a *** 56 y.o. female with a hx of   Hypertension  Hyperlipidemia  Diabetes mellitus  OSA on CPAP  +Cigs Morbid obesity  She was evaluated by Dr. Shari Prows in February 2022 for shortness of breath and chest pain.  CCTA and echocardiogram were ordered but never performed..  Seen in urgent care 06/2021 for flu.  CXR demonstrated cardiomegaly and she was asked to follow-up with cardiology.  She was last seen in our office on 07/10/2021 by Tereso Newcomer, PA at which time she reported continued shortness of breath with exertion.  She sleeps with a CPAP.  She was again encouraged to pursue echocardiogram and coronary CTA for evaluation of SOB and cardiomegaly seen on CXR and advised to follow-up in 3 months.   Coronary CTA 07/24/2021 revealed calcium score 601 which is 99th percentile for age/sex, normal ascending thoracic aorta 3.6 cm, 25- 49% calcified plaque proximal LAD, 1 to 24% calcified plaque mid LAD, 25 to 49% calcified proximal D1 stenosis, 25 to 49% calcified plaque proximal circumflex, 1 to 24% proximal OM1, left main normal. FFR negative. Echocardiogram 10/26/2021 revealed normal LVEF, moderate LVH, mild diastolic dysfunction, mild MR, mild dilatation of aorta.  Plan to repeat echocardiogram in 1 year for aortic dilatation.   Today, she is here   Has repeat lipids ordreed   Past Medical History:  Diagnosis Date   Ascending aorta dilation (HCC) 10/27/2021   Echocardiogram 10/2021: Ascending aorta 39 mm   Cardiomegaly 07/10/2021   Echocardiogram 10/2021: EF 60-65, no RWMA, mod LVH, Gr 1 DD, normal RVSF, normal PASP, severe  LAE, mild MR, ascending aorta 39 mm   Coronary artery disease 07/24/2021   CCTA 2/23: CAC score 601 (99th percentile); pLAD 25-49, mLAD 1-24, pD1 25-49, pLCx 25-49, pOM1 1-24; FFR negative; Asc Aorta 3.6 cm; hepatic steatosis    Hyperlipidemia    Hypertension    Obesity    OSA (obstructive sleep apnea)    Pre-diabetes    Tobacco use     Past Surgical History:  Procedure Laterality Date   LEEP  2018    Current Medications: No outpatient medications have been marked as taking for the 11/22/21 encounter (Appointment) with Lissa Hoard Zachary George, NP.     Allergies:   Lisinopril   Social History   Socioeconomic History   Marital status: Single    Spouse name: Not on file   Number of children: 0   Years of education: Not on file   Highest education level: Some college, no degree  Occupational History   Not on file  Tobacco Use   Smoking status: Light Smoker    Packs/day: 0.15    Types: Cigarettes   Smokeless tobacco: Never   Tobacco comments:    smokes with drinking more (once a month), 5-6 cigarettes a day  Vaping Use   Vaping Use: Never used  Substance and Sexual Activity   Alcohol use: Yes    Comment: occasional   Drug use: No   Sexual activity: Never  Other Topics Concern   Not on file  Social History Narrative  Not on file   Social Determinants of Health   Financial Resource Strain: Not on file  Food Insecurity: Not on file  Transportation Needs: No Transportation Needs (02/03/2020)   PRAPARE - Administrator, Civil Service (Medical): No    Lack of Transportation (Non-Medical): No  Physical Activity: Not on file  Stress: Not on file  Social Connections: Not on file     Family History: The patient's ***family history includes Aneurysm in her sister and sister; Diabetes in her maternal grandmother and mother; Drug abuse in her sister; Heart attack in her father; Hypertension in her brother; Kidney failure in her brother; Leukemia (age of onset: 78)  in her mother.  ROS:   Please see the history of present illness.    *** All other systems reviewed and are negative.  Labs/Other Studies Reviewed:    The following studies were reviewed today:  CCTA 07/30/21  1. Coronary calcium score 601 which is 99 th percentile for age/sex   2.  Normal ascending thoracic aorta 3.6 cm   3.  CAD RADS 2 Poor opacification of vessels Study sent for FFR CT  Negative FFR CT  Recent Labs: 06/06/2021: BUN 11; Hemoglobin 13.7; Platelets 212; Potassium 3.3; Sodium 141 07/10/2021: NT-Pro BNP 289 07/23/2021: Creatinine, Ser 0.80 08/30/2021: ALT 45; TSH 0.720  Recent Lipid Panel    Component Value Date/Time   CHOL 157 08/30/2021 1548   TRIG 157 (H) 08/30/2021 1548   HDL 53 08/30/2021 1548   CHOLHDL 3.0 08/30/2021 1548   CHOLHDL 5.9 06/30/2012 1626   VLDL 30 06/30/2012 1626   LDLCALC 77 08/30/2021 1548     Risk Assessment/Calculations:   {Does this patient have ATRIAL FIBRILLATION?[phone]}       Physical Exam:    VS:  LMP 11/21/2012     Wt Readings from Last 3 Encounters:  09/24/21 292 lb (132.5 kg)  08/30/21 294 lb 3.2 oz (133.4 kg)  08/02/21 295 lb (133.8 kg)     GEN: *** Well nourished, well developed in no acute distress HEENT: Normal NECK: No JVD; No carotid bruits CARDIAC: ***RRR, no murmurs, rubs, gallops RESPIRATORY:  Clear to auscultation without rales, wheezing or rhonchi  ABDOMEN: Soft, non-tender, non-distended MUSCULOSKELETAL:  No edema; No deformity. *** pedal pulses, ***bilaterally SKIN: Warm and dry NEUROLOGIC:  Alert and oriented x 3 PSYCHIATRIC:  Normal affect   EKG:  EKG is *** ordered today.  The ekg ordered today demonstrates ***  Diagnoses:    No diagnosis found. Assessment and Plan:     CAD Chronic HFpEF: Hyperlipidemia LDL goal < 70: Thoracic aortic dilatation: Hypertension:  {Are you ordering a CV Procedure (e.g. stress test, cath, DCCV, TEE, etc)?   Press F2        :[id number]}    Medication  Adjustments/Labs and Tests Ordered: Current medicines are reviewed at length with the patient today.  Concerns regarding medicines are outlined above.  No orders of the defined types were placed in this encounter.  No orders of the defined types were placed in this encounter.   There are no Patient Instructions on file for this visit.   Signed, Levi Aland, NP  11/21/2021 2:32 PM    [facility]

## 2021-11-22 ENCOUNTER — Other Ambulatory Visit: Payer: No Typology Code available for payment source

## 2021-11-22 ENCOUNTER — Ambulatory Visit: Payer: No Typology Code available for payment source | Admitting: Nurse Practitioner

## 2021-11-26 ENCOUNTER — Other Ambulatory Visit (HOSPITAL_COMMUNITY): Payer: Self-pay

## 2021-11-26 ENCOUNTER — Other Ambulatory Visit: Payer: Self-pay | Admitting: Student

## 2021-11-26 DIAGNOSIS — I10 Essential (primary) hypertension: Secondary | ICD-10-CM

## 2021-11-26 MED ORDER — CARVEDILOL 25 MG PO TABS
37.5000 mg | ORAL_TABLET | Freq: Two times a day (BID) | ORAL | 5 refills | Status: DC
  Filled 2021-11-26: qty 90, 30d supply, fill #0
  Filled 2021-12-25: qty 90, 30d supply, fill #1
  Filled 2022-01-17 – 2022-01-21 (×2): qty 90, 30d supply, fill #2
  Filled 2022-02-25: qty 90, 30d supply, fill #3
  Filled 2022-04-01: qty 90, 30d supply, fill #4
  Filled 2022-04-29: qty 90, 30d supply, fill #5

## 2021-11-28 ENCOUNTER — Other Ambulatory Visit (HOSPITAL_COMMUNITY): Payer: Self-pay

## 2021-11-29 ENCOUNTER — Other Ambulatory Visit: Payer: No Typology Code available for payment source

## 2021-11-30 ENCOUNTER — Other Ambulatory Visit: Payer: No Typology Code available for payment source

## 2021-12-06 ENCOUNTER — Other Ambulatory Visit: Payer: No Typology Code available for payment source

## 2021-12-12 ENCOUNTER — Other Ambulatory Visit (HOSPITAL_COMMUNITY): Payer: Self-pay

## 2021-12-18 NOTE — Progress Notes (Deleted)
Office Visit    Patient Name: Virginia Levine Date of Encounter: 12/18/2021  PCP:  Shelby Mattocks, DO   [facility]  Cardiologist:  Meriam Sprague, MD  Advanced Practice Provider:  No care team member to display Electrophysiologist:  None   HPI    Virginia Levine is a 56 y.o. female with a hx of  presents today for hypertension, hyperlipidemia, diabetes mellitus, OSA on CPAP, tobacco abuse, and morbid obesity who is here today for follow-up.  She was evaluated by Dr. Shari Prows in 2/22 for shortness of breath and chest pain.  Coronary CTA and echocardiogram were ordered but never performed.  She was seen in urgent care 2/23 and a chest x-ray was performed due to flu symptoms.  That showed cardiomegaly and she was asked to follow-up with cardiology.She was seen by [place] shortly after.  She remains short of breath with exertion.  She was sleeping with her CPAP machine.  She denied any PND, leg edema, and syncope.  She was having some chest discomfort mainly at night when she laid down.  A coronary CTA, echocardiogram, and BNP labs were ordered.  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   Echocardiogram 10/2021: Ascending aorta 39 mm   Cardiomegaly 07/10/2021   Echocardiogram 10/2021: EF 60-65, no RWMA, mod LVH, Gr 1 DD, normal RVSF, normal PASP, severe LAE, mild MR, ascending aorta 39 mm   Coronary artery disease 07/24/2021   CCTA 2/23: CAC score 601 (99th percentile); pLAD 25-49, mLAD 1-24, pD1 25-49, pLCx 25-49, pOM1 1-24; FFR negative; Asc Aorta 3.6 cm; hepatic steatosis    Hyperlipidemia    Hypertension    Obesity    OSA (obstructive sleep apnea)    Pre-diabetes    Tobacco use    Past Surgical History:  Procedure Laterality Date   LEEP  2018    Allergies  Allergies  Allergen Reactions   Lisinopril Other (See Comments)    Angioedema / Hair loss/burned scalp     EKGs/Labs/Other  Studies Reviewed:   The following studies were reviewed today:  Coronary CTA 2/23 IMPRESSION: 1. Coronary calcium score 601 which is 99 th percentile for age/sex   2.  Normal ascending thoracic aorta 3.6 cm   3.  CAD RADS 2 Poor opacification of vessels Study sent for FFR CT   TECHNIQUE: The best systolic and diastolic phases of the patients gated cardiac CTA sent to [facility] for hemodynamic analysis   FINDINGS: FFR CT negative   LAD: 0.97 proximal, 0.92 mid, 0.86 distal 0.77 apically   RCA: 0.99 proximal, 0.96 mid, 0.94 distal   LCX: 0.99 proximal, 0.98 mid   OM1: 0.96   D1: 0.92   D2: 0.91   IMPRESSION: Negative FFR CT  Echo 5/23   IMPRESSIONS     1. Left ventricular ejection fraction, by estimation, is 60 to 65%. The  left ventricle has normal function. The left ventricle has no regional  wall motion abnormalities. There is moderate concentric left ventricular  hypertrophy. Left ventricular  diastolic parameters are consistent with Grade I diastolic dysfunction  (impaired relaxation).   2. Right ventricular systolic function is normal. The right ventricular  size is normal. There is normal pulmonary artery systolic pressure.   3. Left atrial size was severely dilated.   4. The mitral valve is normal in structure. Mild mitral valve  regurgitation. No evidence of mitral stenosis.  5. The aortic valve is tricuspid. Aortic valve regurgitation is not  visualized. No aortic stenosis is present.   6. Aortic dilatation noted. There is mild dilatation of the ascending  aorta, measuring 39 mm.   7. The inferior vena cava is normal in size with greater than 50%  respiratory variability, suggesting right atrial pressure of 3 mmHg.   EKG:  EKG is *** ordered today.  The ekg ordered today demonstrates ***  Recent Labs: 06/06/2021: BUN 11; Hemoglobin 13.7; Platelets 212; Potassium 3.3; Sodium 141 07/10/2021: NT-Pro BNP 289 07/23/2021: Creatinine, Ser 0.80 08/30/2021:  ALT 45; TSH 0.720  Recent Lipid Panel    Component Value Date/Time   CHOL 157 08/30/2021 1548   TRIG 157 (H) 08/30/2021 1548   HDL 53 08/30/2021 1548   CHOLHDL 3.0 08/30/2021 1548   CHOLHDL 5.9 06/30/2012 1626   VLDL 30 06/30/2012 1626   LDLCALC 77 08/30/2021 1548    Risk Assessment/Calculations:  {Does this patient have ATRIAL FIBRILLATION?:[phone]}  Home Medications   No outpatient medications have been marked as taking for the 12/20/21 encounter (Appointment) with Sharlene Dory, PA-C.     Review of Systems   ***   All other systems reviewed and are otherwise negative except as noted above.  Physical Exam    VS:  LMP 11/21/2012  , BMI There is no height or weight on file to calculate BMI.  Wt Readings from Last 3 Encounters:  09/24/21 292 lb (132.5 kg)  08/30/21 294 lb 3.2 oz (133.4 kg)  08/02/21 295 lb (133.8 kg)     GEN: Well nourished, well developed, in no acute distress. HEENT: normal. Neck: Supple, no JVD, carotid bruits, or masses. Cardiac: ***RRR, no murmurs, rubs, or gallops. No clubbing, cyanosis, edema.  ***Radials/PT 2+ and equal bilaterally.  Respiratory:  ***Respirations regular and unlabored, clear to auscultation bilaterally. GI: Soft, nontender, nondistended. MS: No deformity or atrophy. Skin: Warm and dry, no rash. Neuro:  Strength and sensation are intact. Psych: Normal affect.  Assessment & Plan    Dyspnea on exertion -coronary calcium score of 601, FFR negative Hypertension Cardiomegaly Hyperlipidemia Tobacco abuse Mild ascending aortic aneurysm-39 mm     Disposition: Follow up {follow up:15908} with Meriam Sprague, MD or APP.  Signed, Sharlene Dory, PA-C 12/18/2021, 9:39 AM  [facility]

## 2021-12-20 ENCOUNTER — Other Ambulatory Visit (HOSPITAL_COMMUNITY): Payer: Self-pay

## 2021-12-20 ENCOUNTER — Ambulatory Visit: Payer: No Typology Code available for payment source | Admitting: Physician Assistant

## 2021-12-20 ENCOUNTER — Other Ambulatory Visit: Payer: No Typology Code available for payment source

## 2021-12-21 ENCOUNTER — Other Ambulatory Visit (HOSPITAL_COMMUNITY): Payer: Self-pay

## 2021-12-25 ENCOUNTER — Other Ambulatory Visit (HOSPITAL_COMMUNITY): Payer: Self-pay

## 2022-01-15 ENCOUNTER — Ambulatory Visit: Payer: No Typology Code available for payment source | Admitting: Podiatry

## 2022-01-15 NOTE — Progress Notes (Deleted)
  SUBJECTIVE:   CHIEF COMPLAINT / HPI:   Type 2 Diabetes: Last two A1C's below. Home medications include: Trulicity ***. {Blank single:19197::"Does","Does not"} endorse compliance. Home glucose monitoring {home testing:315145}.   Most recent A1Cs:  Lab Results  Component Value Date   HGBA1C 6.5 07/05/2021   HGBA1C 6.7 (A) 08/02/2020   Last Microalbumin, LDL, Creatinine: Lab Results  Component Value Date   LDLCALC 77 08/30/2021   CREATININE 0.80 07/23/2021    Patient is not up to date on diabetic eye. Patient is not up to date on diabetic foot exam.    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   Echocardiogram 10/2021: Ascending aorta 39 mm   Cardiomegaly 07/10/2021   Echocardiogram 10/2021: EF 60-65, no RWMA, mod LVH, Gr 1 DD, normal RVSF, normal PASP, severe LAE, mild MR, ascending aorta 39 mm   Coronary artery disease 07/24/2021   CCTA 2/23: CAC score 601 (99th percentile); pLAD 25-49, mLAD 1-24, pD1 25-49, pLCx 25-49, pOM1 1-24; FFR negative; Asc Aorta 3.6 cm; hepatic steatosis    Hyperlipidemia    Hypertension    Obesity    OSA (obstructive sleep apnea)    Pre-diabetes    Tobacco use     OBJECTIVE:  LMP 11/21/2012   General: NAD, pleasant, able to participate in exam Cardiac: RRR, no murmurs auscultated Respiratory: CTAB, normal WOB Abdomen: soft, non-tender, non-distended, normoactive bowel sounds Extremities: warm and well perfused, no edema or cyanosis Skin: warm and dry, no rashes noted Neuro: alert, no obvious focal deficits, speech normal Psych: Normal affect and mood  ASSESSMENT/PLAN:  No problem-specific Assessment & Plan notes found for this encounter.   No orders of the defined types were placed in this encounter.  No orders of the defined types were placed in this encounter.  No follow-ups on file. Shelby Mattocks, DO 01/15/2022, 10:55 AM PGY-***,  [facility] {    This will disappear when note is signed, click to select method of visit    :1}

## 2022-01-17 ENCOUNTER — Other Ambulatory Visit (HOSPITAL_COMMUNITY): Payer: Self-pay

## 2022-01-18 ENCOUNTER — Ambulatory Visit: Payer: No Typology Code available for payment source | Admitting: Student

## 2022-01-18 DIAGNOSIS — E119 Type 2 diabetes mellitus without complications: Secondary | ICD-10-CM

## 2022-01-21 ENCOUNTER — Other Ambulatory Visit (HOSPITAL_COMMUNITY): Payer: Self-pay

## 2022-01-22 ENCOUNTER — Ambulatory Visit (INDEPENDENT_AMBULATORY_CARE_PROVIDER_SITE_OTHER): Payer: No Typology Code available for payment source | Admitting: Podiatry

## 2022-01-22 ENCOUNTER — Encounter: Payer: Self-pay | Admitting: Podiatry

## 2022-01-22 DIAGNOSIS — M79675 Pain in left toe(s): Secondary | ICD-10-CM

## 2022-01-22 DIAGNOSIS — E119 Type 2 diabetes mellitus without complications: Secondary | ICD-10-CM

## 2022-01-22 DIAGNOSIS — B351 Tinea unguium: Secondary | ICD-10-CM

## 2022-01-22 DIAGNOSIS — M79674 Pain in right toe(s): Secondary | ICD-10-CM

## 2022-01-22 NOTE — Progress Notes (Signed)
This patient returns to my office for at risk foot care.  This patient requires this care by a professional since this patient will be at risk due to having diabetes.  This patient is unable to cut nails herself since the patient cannot reach hiernails.These nails are painful walking and wearing shoes.  This patient presents for at risk foot care today.  General Appearance  Alert, conversant and in no acute stress.  Vascular  Dorsalis pedis and posterior tibial  pulses are palpable  bilaterally.  Capillary return is within normal limits  bilaterally. Temperature is within normal limits  bilaterally.  Neurologic  Senn-Weinstein monofilament wire test within normal limits  bilaterally. Muscle power within normal limits bilaterally.  Nails Thick disfigured discolored nails with subungual debris  from hallux to fifth toes bilaterally. No evidence of bacterial infection or drainage bilaterally.  Orthopedic  No limitations of motion  feet .  No crepitus or effusions noted.  No bony pathology or digital deformities noted.  Skin  normotropic skin with no porokeratosis noted bilaterally.  No signs of infections or ulcers noted.     Onychomycosis  Pain in right toes  Pain in left toes  Consent was obtained for treatment procedures.   Mechanical debridement of nails 1-5  bilaterally performed with a nail nipper.  Filed with dremel without incident.    Return office visit   3 months                   Told patient to return for periodic foot care and evaluation due to potential at risk complications.   Helane Gunther DPM

## 2022-01-31 ENCOUNTER — Ambulatory Visit: Payer: No Typology Code available for payment source | Admitting: Family Medicine

## 2022-02-14 ENCOUNTER — Ambulatory Visit: Payer: No Typology Code available for payment source | Admitting: Podiatry

## 2022-02-18 ENCOUNTER — Other Ambulatory Visit (HOSPITAL_COMMUNITY): Payer: Self-pay

## 2022-02-18 ENCOUNTER — Ambulatory Visit (INDEPENDENT_AMBULATORY_CARE_PROVIDER_SITE_OTHER): Payer: No Typology Code available for payment source | Admitting: Student

## 2022-02-18 ENCOUNTER — Encounter: Payer: Self-pay | Admitting: Student

## 2022-02-18 VITALS — BP 138/88 | HR 83 | Ht 67.0 in | Wt 297.0 lb

## 2022-02-18 DIAGNOSIS — G8929 Other chronic pain: Secondary | ICD-10-CM

## 2022-02-18 DIAGNOSIS — G4733 Obstructive sleep apnea (adult) (pediatric): Secondary | ICD-10-CM

## 2022-02-18 DIAGNOSIS — M5441 Lumbago with sciatica, right side: Secondary | ICD-10-CM

## 2022-02-18 DIAGNOSIS — E119 Type 2 diabetes mellitus without complications: Secondary | ICD-10-CM | POA: Diagnosis not present

## 2022-02-18 DIAGNOSIS — M5442 Lumbago with sciatica, left side: Secondary | ICD-10-CM

## 2022-02-18 LAB — POCT GLYCOSYLATED HEMOGLOBIN (HGB A1C): HbA1c, POC (controlled diabetic range): 6.4 % (ref 0.0–7.0)

## 2022-02-18 MED ORDER — TRULICITY 0.75 MG/0.5ML ~~LOC~~ SOAJ
0.7500 mg | SUBCUTANEOUS | 0 refills | Status: AC
  Filled 2022-02-18 – 2023-01-13 (×3): qty 2, 28d supply, fill #0

## 2022-02-18 NOTE — Progress Notes (Unsigned)
  SUBJECTIVE:   CHIEF COMPLAINT / HPI:   Difficulty sleeping: trouble falling asleep and staying asleep, she'll sleep 2-3 hours and then be awake. She recently moved. Has sleep apnea but has not used her CPAP for the last 2 months.   Bilateral leg pain: both legs, 4 months, upper legs, anterior, all the way to mid shin, ibuprofen helps for a little bit, lower back pain, feels as if she's going to fall down because of the weakness, states the pain varies on when it presents, no positional change which improves the pain,   PERTINENT  PMH / PSH: HTN, T2DM, OSA, insomnia, GERD, HLD  OBJECTIVE:  BP 138/88   Pulse 83   Ht 5\' 7"  (1.702 m)   Wt 297 lb (134.7 kg)   LMP 11/21/2012   SpO2 94%   BMI 46.52 kg/m   General: NAD, pleasant, able to participate in exam Cardiac: RRR, no murmurs auscultated Respiratory: CTAB, normal WOB Abdomen: soft, non-tender, non-distended, normoactive bowel sounds Extremities: warm and well perfused, no edema or cyanosis Skin: warm and dry, no rashes noted Neuro: alert, no obvious focal deficits, speech normal Psych: Normal affect and mood  ASSESSMENT/PLAN:  No problem-specific Assessment & Plan notes found for this encounter.   No orders of the defined types were placed in this encounter.  No orders of the defined types were placed in this encounter.  No follow-ups on file. Wells Guiles, DO 02/18/2022, 3:44 PM PGY-***, [facility] {    This will disappear when note is signed, click to select method of visit    :1}

## 2022-02-18 NOTE — Patient Instructions (Signed)
It was great to see you today! Thank you for choosing [facility] for your primary care. Virginia Levine was seen for difficulty sleeping and back pain.  Today we addressed: Difficulty sleeping: You need to start using your CPAP again.  We discussed how not using the CPAP will continue to affect this and your fatigue and generalized pain. Low back pain: I am not concerned at this time for any disc herniation.  I suspect this is related to having carried increased weight and being on your feet for so long.  A regular exercise plan and addressing your weight will be the most beneficial way to relieve this although understandably it it may be a hurdle.  In the meantime, you may take ibuprofen 600mg  3 times daily and Tylenol 1 g every 6 hours.  Additionally you may use lidocaine patches and heat packs.  Please do not depend on these as they are short-term relief's and will not address the long-term issues that are plaguing you. Diabetes: We are restarting her Trulicity. There is still much to do and get you up on, please schedule an annual physical exam in 1 month so that we may address things such as mammogram, colonoscopy, diabetic foot exam, vaccines  If you haven't already, sign up for My Chart to have easy access to your labs results, and communication with your primary care physician.  I recommend that you always bring your medications to each appointment as this makes it easy to ensure you are on the correct medications and helps [place] not miss refills when you need them. Call the clinic at [phone] if your symptoms worsen or you have any concerns.  You should return to our clinic Return in about 1 month (around 03/20/2022) for Annual physical. Please arrive 15 minutes before your appointment to ensure smooth check in process.  We appreciate your efforts in making this happen.  Thank you for allowing me to participate in your care, Wells Guiles, DO 02/18/2022, 4:21 PM PGY-2, [place]

## 2022-02-19 ENCOUNTER — Telehealth: Payer: Self-pay

## 2022-02-19 DIAGNOSIS — M539 Dorsopathy, unspecified: Secondary | ICD-10-CM | POA: Insufficient documentation

## 2022-02-19 DIAGNOSIS — M549 Dorsalgia, unspecified: Secondary | ICD-10-CM | POA: Insufficient documentation

## 2022-02-19 NOTE — Telephone Encounter (Signed)
Patient contacted and pain management discussed in detail.   Patient appreciative of call.

## 2022-02-19 NOTE — Assessment & Plan Note (Signed)
Chronic because this is longer than 3 months, not associated with trauma.  Likely no benefit with x-ray at this time. Likely related to laborious job and significant weight.  Discussed conservative measures, benefit of exercise and weight loss.

## 2022-02-19 NOTE — Assessment & Plan Note (Signed)
Advise restarting CPAP as previously received thorough work-up for fatigue and given that she has stopped CPAP, likely largest contributing factor to difficulty sleeping and fatigue.  States she may need some more supplies such as the nose pieces. Ordered DME supplies for CPAP.

## 2022-02-19 NOTE — Telephone Encounter (Signed)
Patient calls nurse line in regards to ibuprofen.   Patient reports she was told by PCP yesterday to take 600mg  for back pain. However, patient reports today she was told by "[facility] at [facility]" that she should not take this due to hypertension diagnoses.  Patient would like to see if Tylenol is a "better option" for her.   Please advise.   Will forward to PCP.

## 2022-02-19 NOTE — Assessment & Plan Note (Signed)
well controlled -A1c 6.4 today -Medications: Restart Trulicity

## 2022-02-19 NOTE — Assessment & Plan Note (Signed)
>>  ASSESSMENT AND PLAN FOR BACK PAIN WRITTEN ON 02/19/2022  8:36 AM BY Calena Salem, DO  Chronic because this is longer than 3 months, not associated with trauma.  Likely no benefit with x-ray at this time. Likely related to laborious job and significant weight.  Discussed conservative measures, benefit of exercise and weight loss.

## 2022-02-20 ENCOUNTER — Ambulatory Visit: Payer: No Typology Code available for payment source | Admitting: Podiatry

## 2022-02-22 ENCOUNTER — Telehealth: Payer: Self-pay

## 2022-02-22 NOTE — Telephone Encounter (Signed)
Community message sent to [employer] for Cpap Supplies.

## 2022-02-25 ENCOUNTER — Other Ambulatory Visit (HOSPITAL_COMMUNITY): Payer: Self-pay

## 2022-02-26 ENCOUNTER — Other Ambulatory Visit (HOSPITAL_COMMUNITY): Payer: Self-pay

## 2022-02-26 NOTE — Telephone Encounter (Signed)
[employer] confirmed order. 

## 2022-03-01 ENCOUNTER — Other Ambulatory Visit (HOSPITAL_COMMUNITY): Payer: Self-pay

## 2022-03-07 ENCOUNTER — Ambulatory Visit: Payer: Self-pay | Admitting: Student

## 2022-03-14 ENCOUNTER — Ambulatory Visit: Payer: No Typology Code available for payment source | Admitting: Student

## 2022-03-24 NOTE — Progress Notes (Deleted)
    SUBJECTIVE:   Chief compliant/HPI: annual examination  Virginia Levine is a 56 y.o. who presents today for an annual exam.    History tabs reviewed and updated ***.   Review of systems form reviewed and notable for ***.   OBJECTIVE:   LMP 11/21/2012   ***  ASSESSMENT/PLAN:   No problem-specific Assessment & Plan notes found for this encounter.    Annual Examination  See AVS for age appropriate recommendations  PHQ score ***, reviewed and discussed.  BP reviewed and at goal ***.  Asked about intimate partner violence and resources given as appropriate  Advance directives discussion ***  Considered the following items based upon USPSTF recommendations: Diabetes screening: {discussed/ordered:14545} Screening for elevated cholesterol: {discussed/ordered:14545} HIV testing: {discussed/ordered:14545} Hepatitis C: {discussed/ordered:14545} Hepatitis B: {discussed/ordered:14545} Syphilis if at high risk: {discussed/ordered:14545} GC/CT {GC/CT screening :23818} Osteoporosis screening considered based upon risk of fracture from [facility] calculator. Major osteoporotic fracture risk is ***%. DEXA {ordered not order:23822}.  Reviewed risk factors for latent tuberculosis and {not indicated/requested/declined:14582}   Discussed family history, BRCA testing {not indicated/requested/declined:14582}. Tool used to risk stratify was ***.  Cervical cancer screening: {PAPTYPE:23819} Breast cancer screening: {mammoscreen:23820} Colorectal cancer screening: {crcscreen:23821::"discussed, colonoscopy ordered"} Lung cancer screening: {discussed/declined/written info:19698}. See documentation below regarding indications/risks/benefits.  Vaccinations ***.   Follow up in 1 *** year or sooner if indicated.    Wells Guiles, [place]

## 2022-03-25 ENCOUNTER — Ambulatory Visit: Payer: No Typology Code available for payment source | Admitting: Student

## 2022-03-25 NOTE — Patient Instructions (Incomplete)
It was great to see you today! Thank you for choosing [facility] for your primary care. Virginia Levine was seen for ***.  Today we addressed: *** {avshealthcaremaintenance:27981}   Things to do to Keep yourself Healthy - Exercise at least 30-45 minutes a day, 3-4 days a week. >150 min of moderate intensity per week is advised. - Eat a low-fat diet with lots of fruits and vegetables, up to 7-9 servings per day. - Seatbelts can save your life. Wear them always. - Smoke detectors on every level of your home, check batteries every year. - Eye Doctor - have an eye exam every 1-2 years - Safe sex - if you may be exposed to STDs, use a condom. - Alcohol If you drink, do it moderately, less than 1 drink per day. - [place].  Choose someone to speak for you if you are not able. - Depression is common in our stressful world.If you're feeling down or losing interest in things you normally enjoy, please come in for a visit. - Violence - If anyone is threatening or hurting you, please call immediately.   If you haven't already, sign up for My Chart to have easy access to your labs results, and communication with your primary care physician.  {AVS options:28020}  You should return to our clinic No follow-ups on file. Please arrive 15 minutes before your appointment to ensure smooth check in process.  We appreciate your efforts in making this happen.  Thank you for allowing me to participate in your care, Wells Guiles, DO 03/25/2022, 12:59 PM PGY-***, [place]

## 2022-04-01 ENCOUNTER — Other Ambulatory Visit (HOSPITAL_COMMUNITY): Payer: Self-pay

## 2022-04-01 ENCOUNTER — Other Ambulatory Visit: Payer: Self-pay | Admitting: Student

## 2022-04-01 DIAGNOSIS — I10 Essential (primary) hypertension: Secondary | ICD-10-CM

## 2022-04-01 MED ORDER — POTASSIUM CHLORIDE CRYS ER 20 MEQ PO TBCR
20.0000 meq | EXTENDED_RELEASE_TABLET | Freq: Two times a day (BID) | ORAL | 3 refills | Status: DC
  Filled 2022-04-01 – 2022-04-16 (×2): qty 60, 30d supply, fill #0
  Filled 2022-05-21: qty 60, 30d supply, fill #1
  Filled 2022-06-24: qty 120, 60d supply, fill #2

## 2022-04-09 ENCOUNTER — Other Ambulatory Visit (HOSPITAL_COMMUNITY): Payer: Self-pay

## 2022-04-12 ENCOUNTER — Ambulatory Visit: Payer: No Typology Code available for payment source | Admitting: Family Medicine

## 2022-04-16 ENCOUNTER — Other Ambulatory Visit (HOSPITAL_COMMUNITY): Payer: Self-pay

## 2022-04-16 ENCOUNTER — Other Ambulatory Visit: Payer: Self-pay | Admitting: Student

## 2022-04-16 DIAGNOSIS — I10 Essential (primary) hypertension: Secondary | ICD-10-CM

## 2022-04-16 MED ORDER — LOSARTAN POTASSIUM 100 MG PO TABS
100.0000 mg | ORAL_TABLET | Freq: Every day | ORAL | 1 refills | Status: DC
  Filled 2022-04-16: qty 90, 90d supply, fill #0

## 2022-04-18 ENCOUNTER — Other Ambulatory Visit (HOSPITAL_COMMUNITY)
Admission: RE | Admit: 2022-04-18 | Discharge: 2022-04-18 | Disposition: A | Discharge status: Home / Self Care | Payer: No Typology Code available for payment source | Source: Ambulatory Visit | Attending: Family Medicine | Admitting: Family Medicine

## 2022-04-18 ENCOUNTER — Ambulatory Visit (INDEPENDENT_AMBULATORY_CARE_PROVIDER_SITE_OTHER): Payer: No Typology Code available for payment source | Admitting: Student

## 2022-04-18 ENCOUNTER — Other Ambulatory Visit (HOSPITAL_COMMUNITY): Payer: Self-pay

## 2022-04-18 ENCOUNTER — Encounter: Payer: Self-pay | Admitting: Student

## 2022-04-18 VITALS — BP 126/60 | HR 69 | Ht 67.0 in | Wt 300.4 lb

## 2022-04-18 DIAGNOSIS — Z124 Encounter for screening for malignant neoplasm of cervix: Secondary | ICD-10-CM

## 2022-04-18 DIAGNOSIS — M5442 Lumbago with sciatica, left side: Secondary | ICD-10-CM

## 2022-04-18 DIAGNOSIS — R350 Frequency of micturition: Secondary | ICD-10-CM | POA: Diagnosis not present

## 2022-04-18 DIAGNOSIS — I10 Essential (primary) hypertension: Secondary | ICD-10-CM

## 2022-04-18 DIAGNOSIS — R739 Hyperglycemia, unspecified: Secondary | ICD-10-CM

## 2022-04-18 DIAGNOSIS — R102 Pelvic and perineal pain unspecified side: Secondary | ICD-10-CM

## 2022-04-18 DIAGNOSIS — N898 Other specified noninflammatory disorders of vagina: Secondary | ICD-10-CM

## 2022-04-18 DIAGNOSIS — G8929 Other chronic pain: Secondary | ICD-10-CM

## 2022-04-18 DIAGNOSIS — M5441 Lumbago with sciatica, right side: Secondary | ICD-10-CM

## 2022-04-18 LAB — POCT WET PREP (WET MOUNT)
Clue Cells Wet Prep Whiff POC: NEGATIVE
Trichomonas Wet Prep HPF POC: ABSENT
WBC, Wet Prep HPF POC: NONE SEEN

## 2022-04-18 MED ORDER — CYCLOBENZAPRINE HCL 10 MG PO TABS
10.0000 mg | ORAL_TABLET | Freq: Every evening | ORAL | 0 refills | Status: DC | PRN
  Filled 2022-04-18: qty 15, 15d supply, fill #0

## 2022-04-18 NOTE — Patient Instructions (Addendum)
It was great to see you today! Thank you for choosing [facility] for your primary care. Virginia Levine was seen for pap smear.  We will continue with pelvic ultrasound, we will schedule this and have this authorized by her insurance. I will call you or message you with the results of your Pap smear Please continue with follow-up with sports medicine via Dr. Jean Rosenthal. [facility] [address] [place]  [phone] Placed an order for physical therapy I also reordered her Flexeril to to pharmacy, can make you sleepy so be cautious when driving or working. If you experience severe worsening pain, shortness of breath, chest pain, worsening radiation to the legs, urinary or bowel complications, please give [place] a call.  If you haven't already, sign up for My Chart to have easy access to your labs results, and communication with your primary care physician.   You should return to our clinic Return in about 4 weeks (around 05/16/2022) for Follow up of vaginal discharge, pelvic pain . Please arrive 15 minutes before your appointment to ensure smooth check in process.  We appreciate your efforts in making this happen.  Thank you for allowing me to participate in your care, Alfredo Martinez, MD 04/18/2022, 9:33 AM PGY-2, [facility]

## 2022-04-18 NOTE — Assessment & Plan Note (Signed)
Controlled on Coreg 37.5 mg twice daily, HCTZ 25 mg daily, losartan 100 mg daily, amlodipine 5 mg

## 2022-04-18 NOTE — Progress Notes (Signed)
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 She also reports of an odor intermittently Not sexually active and has not been for some time   Pelvic Pain She reports pelvic pain onset several months ago. The pain is sometimes cramping and is unchanged in nature from onset. It can be associated with numbness and is bilateral in nature. She reports that she has increased urinary frequency but not pain and is unsure about the relation of this to her pelvic pain. No vaginal odor or pain.   Back Pain She reports back pain for months. There was not an injury that may have caused the pain. The most recent episode started months ago  The pain is located in the "whole back." With radiation to the front and back thighs intermittently.  It is described as aching. Patient denies bowel incontinence or bladder incontinence She has tried flexeril for treatment with minimal relief   Multiple HM gaps  Eye exam  UACr  TDAP Shingrix  Colonoscopy  MM Foot exam    PERTINENT  PMH / PSH: Ascending aorta dilation  Cardiomegaly  HLD, HTN  Obesity, OSA Prediabetes  Tobacco use     OBJECTIVE:  BP 126/60   Pulse 69   Ht 5\' 7"  (1.702 m)   Wt (!) 300 lb 6 oz (136.2 kg)   LMP 11/21/2012   SpO2 94%   BMI 47.05 kg/m  General: NAD, pleasant, able to participate in exam Cardio: RRR, no m/g/r  Abd: Nondistended, NTTP, BS +, soft, obese  Respiratory: No respiratory distress Skin: warm and dry, no rashes noted Psych: Normal affect and mood GU: Chaperoned by CMA, scant white discharge with small atrophic changes in vaginal vault, no ulcerations Back: Tenderness to palpation over lumbar paraspinal muscles  without bony spinous process tenderness.  Normal strength of the bilateral lower extremities with good sensation.  Palpable DP pulses.  ASSESSMENT/PLAN:  Screening for cervical cancer Assessment & Plan: See above for pap history, pap repeated today with wet prep.  Given brown vaginal discharge, feel it is appropriate to continue with pelvic ultrasound as she is postmenopausal.  Patient also exhibits some pelvic pain that has been present for several months. Orders: -     Cytology - PAP  Urinary frequency UA although does not appear infectious, possibly related to DM2.  -     Urinalysis  Vaginal discharge -     POCT Wet Prep ([place]et [place])  Hyperglycemia UACr to monitor kidney function  -     Microalbumin / creatinine urine ratio  Essential hypertension Assessment & Plan: Controlled on Coreg 37.5 mg twice daily, HCTZ 25 mg daily, losartan 100 mg daily, amlodipine 5 mg   Pelvic pain Assessment & Plan: See screening for cervical cancer  Orders: -     11/23/2012 PELVIC COMPLETE WITH TRANSVAGINAL; Future  Chronic low back pain with bilateral sciatica, unspecified back pain laterality No red flag symptoms to causes concern for neural impingement or cauda equina. Referral to PT, return to sports medicine for assistance. Lumbar XR previously unremarkable. Flexeril for breakthrough pain until sees sports medicine  -     Ambulatory referral to Physical Therapy -     Cyclobenzaprine HCl; Take 1 tablet (  10 mg total) by mouth at bedtime as needed for muscle spasms.  Dispense: 15 tablet; Refill: 0   Return in about 4 weeks (around 05/16/2022) for Follow up of vaginal discharge, pelvic pain . Alfredo Martinez, MD 04/19/2022, 7:45 AM PGY-2, [facility]

## 2022-04-19 ENCOUNTER — Encounter: Payer: Self-pay | Admitting: Student

## 2022-04-19 DIAGNOSIS — Z124 Encounter for screening for malignant neoplasm of cervix: Secondary | ICD-10-CM | POA: Insufficient documentation

## 2022-04-19 DIAGNOSIS — R102 Pelvic and perineal pain: Secondary | ICD-10-CM | POA: Insufficient documentation

## 2022-04-19 NOTE — Assessment & Plan Note (Signed)
See screening for cervical cancer

## 2022-04-19 NOTE — Assessment & Plan Note (Signed)
See above for pap history, pap repeated today with wet prep.  Given brown vaginal discharge, feel it is appropriate to continue with pelvic ultrasound as she is postmenopausal.  Patient also exhibits some pelvic pain that has been present for several months.

## 2022-04-23 ENCOUNTER — Encounter: Payer: Self-pay | Admitting: Student

## 2022-04-23 LAB — CYTOLOGY - PAP
Adequacy: ABSENT
Diagnosis: NEGATIVE

## 2022-04-28 ENCOUNTER — Encounter: Payer: Self-pay | Admitting: Student

## 2022-04-29 ENCOUNTER — Encounter: Payer: Self-pay | Admitting: Podiatry

## 2022-04-29 ENCOUNTER — Other Ambulatory Visit (HOSPITAL_COMMUNITY): Payer: Self-pay

## 2022-04-29 ENCOUNTER — Ambulatory Visit (INDEPENDENT_AMBULATORY_CARE_PROVIDER_SITE_OTHER): Payer: No Typology Code available for payment source | Admitting: Podiatry

## 2022-04-29 DIAGNOSIS — E119 Type 2 diabetes mellitus without complications: Secondary | ICD-10-CM

## 2022-04-29 DIAGNOSIS — B351 Tinea unguium: Secondary | ICD-10-CM

## 2022-04-29 DIAGNOSIS — M79675 Pain in left toe(s): Secondary | ICD-10-CM

## 2022-04-29 DIAGNOSIS — M79674 Pain in right toe(s): Secondary | ICD-10-CM | POA: Diagnosis not present

## 2022-04-29 NOTE — Progress Notes (Addendum)
This patient returns to my office for at risk foot care.  This patient requires this care by a professional since this patient will be at risk due to having diabetes.  This patient is unable to cut nails herself since the patient cannot reach hiernails.These nails are painful walking and wearing shoes.  This patient presents for at risk foot care today.  General Appearance  Alert, conversant and in no acute stress.  Vascular  Dorsalis pedis and posterior tibial  pulses are palpable  bilaterally.  Capillary return is within normal limits  bilaterally. Temperature is within normal limits  bilaterally.  Neurologic  Senn-Weinstein monofilament wire test within normal limits  bilaterally. Muscle power within normal limits bilaterally.  Nails Thick disfigured discolored nails with subungual debris  from hallux to fifth toes bilaterally. No evidence of bacterial infection or drainage bilaterally.  Orthopedic  No limitations of motion  feet .  No crepitus or effusions noted.  No bony pathology or digital deformities noted.  Skin  normotropic skin with no porokeratosis noted bilaterally.  No signs of infections or ulcers noted.     Onychomycosis  Pain in right toes  Pain in left toes  Consent was obtained for treatment procedures.   Mechanical debridement of nails 1-5  bilaterally performed with a nail nipper.  Filed with dremel without incident. Patient has pincer hallux nails.  She would benefit from future nail surgery.   Return office visit   3 months                   Told patient to return for periodic foot care and evaluation due to potential at risk complications.   Helane Gunther DPM

## 2022-05-03 NOTE — Therapy (Deleted)
OUTPATIENT PHYSICAL THERAPY THORACOLUMBAR EVALUATION   Patient Name: Virginia Levine MRN: [id number] DOB:04/28/1966, 56 y.o., female Today's Date: 05/03/2022  END OF SESSION:   Past Medical History:  Diagnosis Date   Ascending aorta dilation (HCC) 10/27/2021   Echocardiogram 10/2021: Ascending aorta 39 mm   Cardiomegaly 07/10/2021   Echocardiogram 10/2021: EF 60-65, no RWMA, mod LVH, Gr 1 DD, normal RVSF, normal PASP, severe LAE, mild MR, ascending aorta 39 mm   Coronary artery disease 07/24/2021   CCTA 2/23: CAC score 601 (99th percentile); pLAD 25-49, mLAD 1-24, pD1 25-49, pLCx 25-49, pOM1 1-24; FFR negative; Asc Aorta 3.6 cm; hepatic steatosis    Hyperlipidemia    Hypertension    Obesity    OSA (obstructive sleep apnea)    Pre-diabetes    Tobacco use    Past Surgical History:  Procedure Laterality Date   LEEP  2018   Patient Active Problem List   Diagnosis Date Noted   Screening for cervical cancer 04/19/2022   Pelvic pain 04/19/2022   Back pain 02/19/2022   Ascending aorta dilation (HCC) 10/27/2021   Depressed mood 08/30/2021   Fatigue 08/30/2021   Morbid obesity with BMI of 45.0-49.9, adult (HCC) 08/30/2021   Hepatic steatosis 08/01/2021   Coronary artery disease involving native coronary artery of native heart without angina pectoris 07/24/2021   Cardiomegaly 07/10/2021   Dyspnea on exertion 07/05/2021   Routine adult health maintenance 07/04/2021   Insurance coverage problems 08/02/2020   Insomnia 04/13/2020   Flat foot 04/13/2020   Hypokalemia 03/23/2020   Type 2 diabetes mellitus without complications (HCC) 02/14/2020   OSA (obstructive sleep apnea) 12/15/2019   Screen for colon cancer 12/15/2019   Elevated liver enzymes 07/06/2018   Gastroesophageal reflux disease 03/16/2018   History of abnormal cervical Pap smear 11/26/2016   Other specified abnormal uterine and vaginal bleeding 03/24/2015   Rectal bleed 03/24/2015   Candidal intertrigo 01/11/2015    Hyperlipidemia 01/11/2015   Obesity 01/21/2013   Tobacco abuse 01/21/2013   Essential hypertension 12/11/2012    PCP: Shelby Mattocks, DO   REFERRING PROVIDER: Carney Living, MD   REFERRING DIAG: M54.41,G89.29,M54.42 (ICD-10-CM) - Chronic low back pain with bilateral sciatica, unspecified back pain laterality  Rationale for Evaluation and Treatment: Rehabilitation  THERAPY DIAG: Chronic low back pain with bilateral sciatica, unspecified back pain laterality  ONSET DATE: chronic  SUBJECTIVE:                                                                                                                                                                                           SUBJECTIVE STATEMENT: ***  PERTINENT HISTORY:  Back Pain She reports back pain for months. There was not an injury that may have caused the pain. The most recent episode started months ago  The pain is located in the "whole back." With radiation to the front and back thighs intermittently.  It is described as aching. Patient denies bowel incontinence or bladder incontinence She has tried flexeril for treatment with minimal relief   PAIN:  Are you having pain? {OPRCPAIN:27236}  PRECAUTIONS: None  WEIGHT BEARING RESTRICTIONS: No  FALLS:  Has patient fallen in last 6 months? No  LIVING ENVIRONMENT: Lives with: lives with their family  OCCUPATION: [employer]  PLOF: {PLOF:24004}  PATIENT GOALS: ***  NEXT MD VISIT:   OBJECTIVE:   DIAGNOSTIC FINDINGS:  FINDINGS: Five non-rib-bearing lumbar vertebrae. Mild anterior and lateral spur formation at multiple levels. Facet degenerative changes in the lower lumbar spine. No fractures, pars defects or subluxations. Atheromatous arterial calcifications without visible aneurysm.   IMPRESSION: Degenerative changes.  No acute abnormality.     Electronically Signed   By: Beckie Salts M.D.   On: 07/07/2021 13:59  PATIENT SURVEYS:  FOTO  ***  SCREENING FOR RED FLAGS: Bowel or bladder incontinence: {Yes/No:304960894} Spinal tumors: {Yes/No:304960894} Cauda equina syndrome: {Yes/No:304960894} Compression fracture: {Yes/No:304960894} Abdominal aneurysm: {Yes/No:304960894}  COGNITION: Overall cognitive status: Within functional limits for tasks assessed     SENSATION: Not tested  MUSCLE LENGTH: Hamstrings: Right *** deg; Left *** deg Maisie Fus test: Right *** deg; Left *** deg  POSTURE: {posture:25561}  PALPATION: ***  LUMBAR ROM:   AROM eval  Flexion   Extension   Right lateral flexion   Left lateral flexion   Right rotation   Left rotation    (Blank rows = not tested)  LOWER EXTREMITY ROM:     {AROM/PROM:27142}  Right eval Left eval  Hip flexion    Hip extension    Hip abduction    Hip adduction    Hip internal rotation    Hip external rotation    Knee flexion    Knee extension    Ankle dorsiflexion    Ankle plantarflexion    Ankle inversion    Ankle eversion     (Blank rows = not tested)  LOWER EXTREMITY MMT:    MMT Right eval Left eval  Hip flexion    Hip extension    Hip abduction    Hip adduction    Hip internal rotation    Hip external rotation    Knee flexion    Knee extension    Ankle dorsiflexion    Ankle plantarflexion    Ankle inversion    Ankle eversion     (Blank rows = not tested)  LUMBAR SPECIAL TESTS:  {lumbar special test:25242}  FUNCTIONAL TESTS:  5 times sit to stand: ***  GAIT: Distance walked: 60ft x2 Assistive device utilized: {Assistive devices:23999} Level of assistance: {Levels of assistance:24026} Comments: ***  TODAY'S TREATMENT:  DATE: ***    PATIENT EDUCATION:  Education details: Discussed eval findings, rehab rationale and POC and patient is in agreement  Person educated: Patient Education method:  Explanation Education comprehension: verbalized understanding and needs further education  HOME EXERCISE PROGRAM: ***  ASSESSMENT:  CLINICAL IMPRESSION: Patient is a *** y.o. *** who was seen today for physical therapy evaluation and treatment for ***.   OBJECTIVE IMPAIRMENTS: {opptimpairments:25111}.   ACTIVITY LIMITATIONS: {activitylimitations:27494}  PARTICIPATION LIMITATIONS: {participationrestrictions:25113}  PERSONAL FACTORS: {Personal factors:25162} are also affecting patient's functional outcome.   REHAB POTENTIAL: {rehabpotential:25112}  CLINICAL DECISION MAKING: {clinical decision making:25114}  EVALUATION COMPLEXITY: {Evaluation complexity:25115}   GOALS: Goals reviewed with patient? {yes/no:20286}  SHORT TERM GOALS: Target date: ***  *** Baseline: Goal status: {GOALSTATUS:25110}  2.  *** Baseline:  Goal status: {GOALSTATUS:25110}  3.  *** Baseline:  Goal status: {GOALSTATUS:25110}  4.  *** Baseline:  Goal status: {GOALSTATUS:25110}  5.  *** Baseline:  Goal status: {GOALSTATUS:25110}  6.  *** Baseline:  Goal status: {GOALSTATUS:25110}  LONG TERM GOALS: Target date: ***  *** Baseline:  Goal status: {GOALSTATUS:25110}  2.  *** Baseline:  Goal status: {GOALSTATUS:25110}  3.  *** Baseline:  Goal status: {GOALSTATUS:25110}  4.  *** Baseline:  Goal status: {GOALSTATUS:25110}  5.  *** Baseline:  Goal status: {GOALSTATUS:25110}  6.  *** Baseline:  Goal status: {GOALSTATUS:25110}  PLAN:  PT FREQUENCY: {rehab frequency:25116}  PT DURATION: {rehab duration:25117}  PLANNED INTERVENTIONS: {rehab planned interventions:25118::"Therapeutic exercises","Therapeutic activity","Neuromuscular re-education","Balance training","Gait training","Patient/Family education","Self Care","Joint mobilization"}.  PLAN FOR NEXT SESSION: ***   Hildred Laser, PT 05/03/2022, 1:09 PM

## 2022-05-06 ENCOUNTER — Ambulatory Visit: Payer: No Typology Code available for payment source

## 2022-05-09 ENCOUNTER — Ambulatory Visit (HOSPITAL_COMMUNITY): Payer: No Typology Code available for payment source

## 2022-05-21 ENCOUNTER — Other Ambulatory Visit: Payer: Self-pay | Admitting: Student

## 2022-05-21 ENCOUNTER — Other Ambulatory Visit (HOSPITAL_COMMUNITY): Payer: Self-pay

## 2022-05-21 DIAGNOSIS — I10 Essential (primary) hypertension: Secondary | ICD-10-CM

## 2022-05-21 MED ORDER — CARVEDILOL 25 MG PO TABS
37.5000 mg | ORAL_TABLET | Freq: Two times a day (BID) | ORAL | 5 refills | Status: DC
  Filled 2022-05-21 – 2022-05-23 (×2): qty 90, 30d supply, fill #0
  Filled 2022-06-24: qty 90, 30d supply, fill #1

## 2022-05-21 MED ORDER — AMLODIPINE BESYLATE 5 MG PO TABS
5.0000 mg | ORAL_TABLET | Freq: Every day | ORAL | 2 refills | Status: DC
  Filled 2022-05-21: qty 90, 90d supply, fill #0

## 2022-05-21 NOTE — Progress Notes (Signed)
  SUBJECTIVE:   CHIEF COMPLAINT / HPI:   Depression: Patient presents hoping to discuss low mood and depression.  She states she is not sleeping well and she is having nightmares.  Currently her depression is being impacted by her work environment which involves a significant mount of drama, she has had a roadblock in terms [place], she has been unable to see her family for the last couple years, she has moved backwards in her journey of losing weight by gaining back the 10 pounds that she lost, and she has gone back to drinking alcohol.  PHQ score of 11 with negative question 9.  She further denies SI/HI.  Her best support system is her sister who she states she speaks to frequently but at this time does not want to open up about a particular struggles in her life.  She endorses feelings of shame regarding her depression.  She has never been diagnosed with depression in the past and states that her growing up did not revolve around many emotional conversations.  Her low back pain also continues to bother her.   Today, she states the goal of her visit is to work on her support system and she does not want to pursue therapy or medication management at this time.  She would like me as her primary care doctor to be part of her support system and states she would benefit from some regular check-in's.  She is looking towards the new year with goals of improving communication in addition to the above-stated challenges in her life.  PERTINENT  PMH / PSH: HTN, HLD, obesity, OSA on CPAP, T2DM, cardiomegaly, ascending aortic dilation, tobacco use  OBJECTIVE:  BP (!) 149/85   Pulse 78   Wt (!) 309 lb (140.2 kg)   LMP 11/21/2012   SpO2 91%   BMI 48.40 kg/m  General: Awake, alert, NAD, sometimes tearful Psych: Low mood, normal affect  ASSESSMENT/PLAN:  Depression, unspecified depression type Assessment & Plan: PHQ-9 score 11 with negative question 9.  Kahdijah has many  challenges in her life, understandably so.  Therapeutic listening provided.  She is tearful but appears to continue encouraged by the potential progress in life changes she can make.  I have discussed with her that the road with depression just like weight gain is filled with success and setbacks.  I have advised setting weekly small goals to tackle rather than large broad goals for a year time period.  She seemed agreeable to this.  I am happy to be part of her support system and advise scheduled monthly phone call check-in's as her ability to attend in person visits seem to be difficult and additionally if it is about matters such as depression or weight gain, that would not necessarily necessitate an in person visit.  I have encouraged her to consider if there is a certain point where she feels she may benefit from a behavioral health counseling or medication management, to please let me know.  I hope that we can make the year of 2024 better for her.   Return in about 4 weeks (around 06/27/2022) for Depression via telephone encounter. Shelby Mattocks, DO 05/31/2022, 2:36 PM PGY-2, [facility]

## 2022-05-22 ENCOUNTER — Other Ambulatory Visit (HOSPITAL_COMMUNITY): Payer: Self-pay

## 2022-05-22 ENCOUNTER — Other Ambulatory Visit: Payer: Self-pay

## 2022-05-23 ENCOUNTER — Ambulatory Visit (HOSPITAL_COMMUNITY): Payer: No Typology Code available for payment source

## 2022-05-23 ENCOUNTER — Other Ambulatory Visit (HOSPITAL_COMMUNITY): Payer: Self-pay

## 2022-05-23 ENCOUNTER — Other Ambulatory Visit: Payer: Self-pay

## 2022-05-29 ENCOUNTER — Other Ambulatory Visit: Payer: Self-pay

## 2022-05-29 DIAGNOSIS — I10 Essential (primary) hypertension: Secondary | ICD-10-CM

## 2022-05-29 NOTE — Telephone Encounter (Signed)
Patient calls nurse line requesting 90 day supply on medications. She states that she will be out of carvedilol next week.   Patient has appointment tomorrow. Advised her to bring all medication to this appointment for review.   Veronda Prude, RN

## 2022-05-30 ENCOUNTER — Ambulatory Visit (INDEPENDENT_AMBULATORY_CARE_PROVIDER_SITE_OTHER): Payer: No Typology Code available for payment source | Admitting: Student

## 2022-05-30 ENCOUNTER — Other Ambulatory Visit (HOSPITAL_COMMUNITY): Payer: Self-pay

## 2022-05-30 ENCOUNTER — Encounter: Payer: Self-pay | Admitting: Student

## 2022-05-30 VITALS — BP 149/85 | HR 78 | Wt 309.0 lb

## 2022-05-30 DIAGNOSIS — F32A Depression, unspecified: Secondary | ICD-10-CM | POA: Diagnosis not present

## 2022-05-31 NOTE — Patient Instructions (Signed)
Patient deferred after visit summary

## 2022-05-31 NOTE — Assessment & Plan Note (Addendum)
PHQ-9 score 11 with negative question 9.  Shakayla has many challenges in her life, understandably so.  Therapeutic listening provided.  She is tearful but appears to continue encouraged by the potential progress in life changes she can make.  I have discussed with her that the road with depression just like weight gain is filled with success and setbacks.  I have advised setting weekly small goals to tackle rather than large broad goals for a year time period.  She seemed agreeable to this.  I am happy to be part of her support system and advise scheduled monthly phone call check-in's as her ability to attend in person visits seem to be difficult and additionally if it is about matters such as depression or weight gain, that would not necessarily necessitate an in person visit.  I have encouraged her to consider if there is a certain point where she feels she may benefit from a behavioral health counseling or medication management, to please let me know.  I hope that we can make the year of 2024 better for her.

## 2022-06-13 ENCOUNTER — Telehealth: Payer: Self-pay

## 2022-06-13 DIAGNOSIS — Z599 Problem related to housing and economic circumstances, unspecified: Secondary | ICD-10-CM

## 2022-06-13 NOTE — Telephone Encounter (Signed)
Patient calls nurse line requesting to speak with Dr. Madison Hickman regarding a personal matter.   I asked if there was anything I could do to help her, she states, "I just need to talk with him about something personal."   Will forward to PCP.   Talbot Grumbling, RN

## 2022-06-14 NOTE — Telephone Encounter (Signed)
Received message about patient wanting to speak with me. Called patient back. She states she lost her job on Friday because she was fired from [employer]. She is in danger of losing her apartment. She has been unable to get any emergency funds and has been reaching out to her support system for assistance. She has to pay her rent on the 5th of every month, luckily this month has already been paid. She is working on getting another job and believes she will be able to get one in 2 weeks. She is requesting assistance of any sort because she is worried of getting evicted.   She also notes she is sick again and considering going to the hospital. The cough she had was okay last week but now it seems like it's getting worse. She got some Robitussin DM and that cleared it up until recently. This morning when she woke up, it seems as if it's gotten worse. She is coughing so much that she has vomited. She is so tired from waking up every 2 hours because of the cough. I have advised her She was not amenable to being seen in access to care this afternoon. States if it gets worse, she knows to go to urgent care or the ED and that is her plan.

## 2022-06-17 ENCOUNTER — Encounter: Payer: Self-pay | Admitting: Student

## 2022-06-17 DIAGNOSIS — E782 Mixed hyperlipidemia: Secondary | ICD-10-CM

## 2022-06-17 DIAGNOSIS — I10 Essential (primary) hypertension: Secondary | ICD-10-CM

## 2022-06-17 DIAGNOSIS — G8929 Other chronic pain: Secondary | ICD-10-CM

## 2022-06-18 ENCOUNTER — Other Ambulatory Visit: Payer: Self-pay | Admitting: Student

## 2022-06-18 DIAGNOSIS — Z599 Problem related to housing and economic circumstances, unspecified: Secondary | ICD-10-CM

## 2022-06-25 ENCOUNTER — Other Ambulatory Visit: Payer: Self-pay

## 2022-06-28 ENCOUNTER — Other Ambulatory Visit (HOSPITAL_COMMUNITY): Payer: Self-pay

## 2022-06-28 MED ORDER — ROSUVASTATIN CALCIUM 20 MG PO TABS
20.0000 mg | ORAL_TABLET | Freq: Every day | ORAL | 3 refills | Status: DC
  Filled 2022-06-28: qty 90, 90d supply, fill #0

## 2022-06-28 MED ORDER — CARVEDILOL 25 MG PO TABS
37.5000 mg | ORAL_TABLET | Freq: Two times a day (BID) | ORAL | 5 refills | Status: DC
  Filled 2022-06-28 – 2022-07-26 (×3): qty 180, 60d supply, fill #0

## 2022-06-28 MED ORDER — ASPIRIN 81 MG PO TBEC
81.0000 mg | DELAYED_RELEASE_TABLET | Freq: Every day | ORAL | 2 refills | Status: DC
  Filled 2022-06-28 – 2022-07-26 (×2): qty 90, 90d supply, fill #0

## 2022-06-28 MED ORDER — LOSARTAN POTASSIUM 100 MG PO TABS
100.0000 mg | ORAL_TABLET | Freq: Every day | ORAL | 1 refills | Status: DC
  Filled 2022-06-28 – 2022-07-26 (×3): qty 90, 90d supply, fill #0

## 2022-06-28 MED ORDER — POTASSIUM CHLORIDE CRYS ER 20 MEQ PO TBCR
20.0000 meq | EXTENDED_RELEASE_TABLET | Freq: Two times a day (BID) | ORAL | 3 refills | Status: DC
  Filled 2022-06-28: qty 180, 90d supply, fill #0

## 2022-06-28 MED ORDER — HYDROCHLOROTHIAZIDE 25 MG PO TABS
25.0000 mg | ORAL_TABLET | Freq: Every day | ORAL | 2 refills | Status: DC
  Filled 2022-06-28 – 2022-07-26 (×3): qty 90, 90d supply, fill #0

## 2022-06-28 MED ORDER — AMLODIPINE BESYLATE 5 MG PO TABS
5.0000 mg | ORAL_TABLET | Freq: Every day | ORAL | 2 refills | Status: DC
  Filled 2022-06-28: qty 90, 90d supply, fill #0

## 2022-07-01 ENCOUNTER — Other Ambulatory Visit (HOSPITAL_COMMUNITY): Payer: Self-pay

## 2022-07-03 ENCOUNTER — Ambulatory Visit: Payer: 59 | Admitting: Podiatry

## 2022-07-08 ENCOUNTER — Ambulatory Visit (INDEPENDENT_AMBULATORY_CARE_PROVIDER_SITE_OTHER): Payer: 59 | Admitting: Podiatry

## 2022-07-08 DIAGNOSIS — Z91199 Patient's noncompliance with other medical treatment and regimen due to unspecified reason: Secondary | ICD-10-CM

## 2022-07-08 NOTE — Progress Notes (Signed)
No show

## 2022-07-09 ENCOUNTER — Other Ambulatory Visit (HOSPITAL_COMMUNITY): Payer: Self-pay

## 2022-07-19 ENCOUNTER — Telehealth: Payer: Self-pay

## 2022-07-19 NOTE — Telephone Encounter (Signed)
Patient calls nurse line regarding continued intermittent pelvic/abdominal pain.   She was ordered a pelvic ultrasound in November 2023, however, never had imaging performed. She is interested in having ultrasound done.   Provided patient with imaging scheduling information. She is going to schedule [place] and then call our office to schedule follow up after having this performed.   Talbot Grumbling, RN

## 2022-07-24 ENCOUNTER — Other Ambulatory Visit (HOSPITAL_COMMUNITY): Payer: Self-pay

## 2022-07-25 ENCOUNTER — Other Ambulatory Visit (HOSPITAL_COMMUNITY): Payer: Self-pay

## 2022-07-26 ENCOUNTER — Other Ambulatory Visit (HOSPITAL_COMMUNITY): Payer: Self-pay

## 2022-07-26 ENCOUNTER — Ambulatory Visit (HOSPITAL_COMMUNITY): Payer: Medicaid Other | Attending: Family Medicine

## 2022-07-27 ENCOUNTER — Other Ambulatory Visit (HOSPITAL_COMMUNITY): Payer: Self-pay

## 2022-07-30 ENCOUNTER — Ambulatory Visit: Payer: Medicaid Other | Admitting: Podiatry

## 2022-07-31 ENCOUNTER — Ambulatory Visit (HOSPITAL_COMMUNITY)
Admission: RE | Admit: 2022-07-31 | Discharge: 2022-07-31 | Disposition: A | Discharge status: Home / Self Care | Payer: Medicaid Other | Source: Ambulatory Visit | Attending: Family Medicine | Admitting: Family Medicine

## 2022-07-31 DIAGNOSIS — R102 Pelvic and perineal pain unspecified side: Secondary | ICD-10-CM

## 2022-08-01 ENCOUNTER — Ambulatory Visit (INDEPENDENT_AMBULATORY_CARE_PROVIDER_SITE_OTHER): Payer: Medicaid Other | Admitting: Podiatry

## 2022-08-01 DIAGNOSIS — M79675 Pain in left toe(s): Secondary | ICD-10-CM | POA: Diagnosis not present

## 2022-08-01 DIAGNOSIS — B351 Tinea unguium: Secondary | ICD-10-CM

## 2022-08-01 DIAGNOSIS — M79674 Pain in right toe(s): Secondary | ICD-10-CM

## 2022-08-01 DIAGNOSIS — E119 Type 2 diabetes mellitus without complications: Secondary | ICD-10-CM

## 2022-08-01 NOTE — Progress Notes (Signed)
  Subjective:  Patient ID: Virginia Levine, female    DOB: 14-Dec-1965,  MRN: [id number]  Chief Complaint  Patient presents with   Nail Problem    RFC     57 y.o. female presents with the above complaint. History confirmed with patient. Patient presenting with pain related to dystrophic thickened elongated nails. Patient is unable to trim own nails related to nail dystrophy and/or mobility issues. Patient does  have a history of T2DM.   Objective:  Physical Exam: warm, good capillary refill nail exam normal nails without lesions, onychomycosis of the toenails, onycholysis, and dystrophic nails DP pulses palpable, PT pulses palpable, and protective sensation intact Left Foot:  Pain with palpation of nails due to elongation and dystrophic growth.  Right Foot: Pain with palpation of nails due to elongation and dystrophic growth.   Assessment:   1. Pain due to onychomycosis of toenails of both feet   2. Type 2 diabetes mellitus without complication, without long-term current use of insulin ([place])      Plan:  Patient was evaluated and treated and all questions answered.  #Onychomycosis with pain  -Nails palliatively debrided as below. -Educated on self-care  Procedure: Nail Debridement Rationale: Pain Type of Debridement: manual, sharp debridement. Instrumentation: Nail nipper, rotary burr. Number of Nails: 10  Return in about 3 months (around 10/30/2022) for RFC.         Everitt Amber, DPM [facility]

## 2022-08-02 ENCOUNTER — Ambulatory Visit: Payer: Medicaid Other | Admitting: Family Medicine

## 2022-08-05 ENCOUNTER — Telehealth: Payer: Self-pay

## 2022-08-05 ENCOUNTER — Ambulatory Visit (INDEPENDENT_AMBULATORY_CARE_PROVIDER_SITE_OTHER): Payer: Medicaid Other | Admitting: Family Medicine

## 2022-08-05 ENCOUNTER — Encounter: Payer: Self-pay | Admitting: Family Medicine

## 2022-08-05 VITALS — BP 132/82 | HR 71 | Wt 300.2 lb

## 2022-08-05 DIAGNOSIS — N898 Other specified noninflammatory disorders of vagina: Secondary | ICD-10-CM

## 2022-08-05 DIAGNOSIS — R300 Dysuria: Secondary | ICD-10-CM | POA: Diagnosis present

## 2022-08-05 DIAGNOSIS — R102 Pelvic and perineal pain: Secondary | ICD-10-CM | POA: Diagnosis not present

## 2022-08-05 LAB — POCT URINALYSIS DIP (MANUAL ENTRY)
Bilirubin, UA: NEGATIVE
Glucose, UA: NEGATIVE mg/dL
Ketones, POC UA: NEGATIVE mg/dL
Nitrite, UA: POSITIVE — AB
Protein Ur, POC: NEGATIVE mg/dL
Spec Grav, UA: 1.02 (ref 1.010–1.025)
Urobilinogen, UA: 0.2 E.U./dL
pH, UA: 6.5 (ref 5.0–8.0)

## 2022-08-05 LAB — POCT UA - MICROSCOPIC ONLY

## 2022-08-05 MED ORDER — PHENAZOPYRIDINE HCL 200 MG PO TABS: 200.0000 mg | ORAL_TABLET | Freq: Three times a day (TID) | ORAL | 0 refills | Status: DC | PRN

## 2022-08-05 NOTE — Telephone Encounter (Signed)
Patient calls nurse line in regards to urine results.   Patient advised we are waiting for the urine culture to come back. Patient advised to pick up Pyridium for sxs relief along with AZO.  Patient advised we will call her when urine culture comes back with antibiotic treatment.

## 2022-08-05 NOTE — Progress Notes (Signed)
    SUBJECTIVE:   CHIEF COMPLAINT / HPI:   Virginia Levine is a 57 y.o. female who presents to the [facility] clinic today to discuss the following concerns:   F/u Pelvic U/S Patient recently underwent a pelvic U/S on 2/28 for ongoing pelvic pain for the last 6 months. Findings did show debris in the bladder which could be suggestive of UTI, otherwise unremarkable. She was recommended to follow up for urine testing.   Reports peeing 3-4 times a night. Yesterday at night she had a burning sensation around vulva but also slightly when urinating. No blood in urine. No N/V. No fevers. States that she may have had a UTI many years ago- no urine cultures on record to show this.   Patient's last menstrual period was 11/21/2012.  PERTINENT  PMH / PSH: Hypertension, OSA, reflux, obesity   OBJECTIVE:   BP 132/82   Pulse 71   Wt (!) 300 lb 3.2 oz (136.2 kg)   LMP 11/21/2012   SpO2 96%   BMI 47.02 kg/m    General: NAD, pleasant, able to participate in exam Respiratory: normal effort Abdomen: Obese abdomen, bowel sounds present, mild ttp to b/l adnexa without R/G, nondistended Back: No CVAT bilaterally  Psych: Normal affect and mood  ASSESSMENT/PLAN:   1. Dysuria Symptoms started yesterday. No fever, N/V, CVAT to suggest pyelonephritis. No prior Ucx on record to review. UA today with small leukocytes, positive nitrites and trace blood (on microscopy only 0-2 RBC). Will f/u Ucx and treat as appropriate - Urine Culture - POCT urinalysis dipstick - POCT UA - Microscopic Only - Rx Pyridium sent; can buy OTC AZO if not covered for symptomatic relief until Ucx results   2. Vaginal discharge Reports brown colored d/c. Pelvic U/S with normal endometrial stripe but concern for possible post-menopausal bleeding given brown discoloration. Would recommend endometrial bx to evaluate for more serious cause. Not sexually active.    Sharion Settler, [place]

## 2022-08-05 NOTE — Patient Instructions (Addendum)
It was wonderful to see you today.  Please bring ALL of your medications with you to every visit.   Today we talked about:  We are checking your urine for an infection. It will take a couple days to get the results back In the meantime I have sent a medication for your symptoms If this is not covered by your insurance, you can buy AZO over the counter. Keep in mind it will turn your urine orange which is okay.   I am unsure what is causing your brown discharge. Given your age, it would be recommended that we proceed with an endometrial biopsy to ensure this is not post-menopausal bleeding. You can schedule this at the front desk.   Thank you for coming to your visit as scheduled. We have had a large "no-show" problem lately, and this significantly limits our ability to see and care for patients. As a friendly reminder- if you cannot make your appointment please call to cancel. We do have a no show policy for those who do not cancel within 24 hours. Our policy is that if you miss or fail to cancel an appointment within 24 hours, 3 times in a 8-monthperiod, you may be dismissed from our clinic.   Thank you for choosing [facility]   Please call [phone] any questions about today's appointment.  Please be sure to schedule follow up at the front  desk before you leave today.   ASharion Settler DO PGY-3 Family Medicine

## 2022-08-07 ENCOUNTER — Other Ambulatory Visit: Payer: Self-pay | Admitting: Family Medicine

## 2022-08-07 ENCOUNTER — Other Ambulatory Visit (HOSPITAL_COMMUNITY): Payer: Self-pay

## 2022-08-07 DIAGNOSIS — B9620 Unspecified Escherichia coli [E. coli] as the cause of diseases classified elsewhere: Secondary | ICD-10-CM

## 2022-08-07 DIAGNOSIS — N390 Urinary tract infection, site not specified: Secondary | ICD-10-CM

## 2022-08-07 LAB — URINE CULTURE

## 2022-08-07 MED ORDER — CEPHALEXIN 500 MG PO CAPS
500.0000 mg | ORAL_CAPSULE | Freq: Two times a day (BID) | ORAL | 0 refills | Status: DC
  Filled 2022-08-07: qty 14, 7d supply, fill #0

## 2022-08-07 MED ORDER — CEPHALEXIN 500 MG PO CAPS: 500.0000 mg | ORAL_CAPSULE | Freq: Two times a day (BID) | ORAL | 0 refills | Status: AC

## 2022-08-08 ENCOUNTER — Other Ambulatory Visit (HOSPITAL_COMMUNITY): Payer: Self-pay

## 2022-08-15 ENCOUNTER — Ambulatory Visit (INDEPENDENT_AMBULATORY_CARE_PROVIDER_SITE_OTHER): Payer: Medicaid Other | Admitting: Family Medicine

## 2022-08-15 VITALS — BP 126/78 | HR 78 | Ht 67.0 in | Wt 301.4 lb

## 2022-08-15 DIAGNOSIS — N898 Other specified noninflammatory disorders of vagina: Secondary | ICD-10-CM | POA: Diagnosis present

## 2022-08-15 LAB — POCT WET PREP (WET MOUNT)
Clue Cells Wet Prep Whiff POC: NEGATIVE
Trichomonas Wet Prep HPF POC: ABSENT
WBC, Wet Prep HPF POC: NONE SEEN

## 2022-08-15 NOTE — Progress Notes (Signed)
    CHIEF COMPLAINT / HPI: Patient has had brown discharge from the vaginal area.  Also currently being treated for urinary tract infection.  Has noticed the brown discharge has gotten better with the antibiotic treatment.  No pelvic pain. #2.  Patient noted to be extremely short of breath on admission to the exam room.    PERTINENT  PMH / PSH: I have reviewed the patient's medications, allergies, past medical and surgical history, smoking status and updated in the EMR as appropriate. Review of chart shows several prior office visits dating back to 2019 for bribed vaginal discharge and recurrent episodes of BV.  PAP 11/23:   Satisfactory for evaluation; transformation zone component ABSENT.   Diagnosis - Negative for intraepithelial lesion or malignancy (NILM)    PAP 02/2020:  Satisfactory for evaluation; transformation zone component PRESENT.   Diagnosis - Atypical squamous cells of undetermined significance (ASC-US) Abnormal    PUS: 07/2022 Measurements: 7.2 x 3.4 x 3.7 cm = volume: 47 mL. Anteverted. Normal morphology without mass  Endometrium  Thickness: 4 mm.  No endometrial fluid or mass     OBJECTIVE:  BP 126/78   Pulse 78   Ht 5\' 7"  (1.702 m)   Wt (!) 301 lb 6.4 oz (136.7 kg)   LMP 11/21/2012   SpO2 90%   BMI 47.21 kg/m  GENERAL: Well-developed no acute distress BMI 47 GU externally normal.  Vaginal canal and introitus normal.  Thin white discharge noted.  Cervix difficult to view but was without lesions.  Os nonparous.  Sample obtained for wet prep. CV: Regular rate and rhythm.  Initially somewhat tachycardic but resolved with a few minutes of Exertion and with discussion of the procedure. lungs: Clear to auscultation bilaterally PROCEDURE NOTE: Endometrial Biopsy Patient given informed consent, signed copy in the chart.  Appropriate time out taken. . The patient was placed in the lithotomy position and the cervix brought into view with sterile speculum.   It was very  difficult to get the cervix and os into view.  Initial attempt at dilation unsuccessful.  Procedure was discontinued.  The patient tolerated the procedure well.  Minimal spotting type bleeding.  Patient given post procedure instructions. I will notify her of any pathology results.   ASSESSMENT / PLAN: #1.  Recurrent brown vaginal discharge: Reviewed pelvic ultrasound which shows endometrial stripe 4 mm which is reassuring.  Entire volume of uterus 47 cc.  This is also reassuring.  Will await results of wet prep.  I suspect this is recurrence of bacterial vaginosis.  Will have her follow-up with her PCP who was also present today and performed the procedure.  He can then decide if she needs repeat attempt at procedure.  I suspect the discharge is not postmenopausal bleeding but rather BV. 2.  Tachycardia and tachypnea: Likely secondary to deconditioning, habitus and anxiety regarding procedure.  These both resolved.  Exam was normal.  No problem-specific Assessment & Plan notes found for this encounter.   Dorcas Mcmurray MD

## 2022-08-23 ENCOUNTER — Telehealth: Payer: Self-pay

## 2022-08-23 DIAGNOSIS — G4733 Obstructive sleep apnea (adult) (pediatric): Secondary | ICD-10-CM

## 2022-08-23 NOTE — Telephone Encounter (Signed)
Patient calls nurse line requesting order for sleep study. She states that she is unable to get CPAP machine from [facility] without updated sleep study.   Will forward to PCP.   Talbot Grumbling, RN

## 2022-08-24 DIAGNOSIS — G4733 Obstructive sleep apnea (adult) (pediatric): Secondary | ICD-10-CM | POA: Diagnosis not present

## 2022-08-27 ENCOUNTER — Other Ambulatory Visit: Payer: Self-pay

## 2022-08-27 DIAGNOSIS — E782 Mixed hyperlipidemia: Secondary | ICD-10-CM

## 2022-08-27 DIAGNOSIS — I10 Essential (primary) hypertension: Secondary | ICD-10-CM

## 2022-08-27 MED ORDER — POTASSIUM CHLORIDE CRYS ER 20 MEQ PO TBCR: 20.0000 meq | EXTENDED_RELEASE_TABLET | Freq: Two times a day (BID) | ORAL | 3 refills | Status: DC

## 2022-08-27 MED ORDER — AMLODIPINE BESYLATE 5 MG PO TABS: 5.0000 mg | ORAL_TABLET | Freq: Every day | ORAL | 2 refills | Status: DC

## 2022-08-27 MED ORDER — ROSUVASTATIN CALCIUM 20 MG PO TABS: 20.0000 mg | ORAL_TABLET | Freq: Every day | ORAL | 3 refills | Status: DC

## 2022-08-28 NOTE — Telephone Encounter (Signed)
Sleep study order placed

## 2022-09-11 ENCOUNTER — Encounter: Payer: Self-pay | Admitting: Student

## 2022-09-11 NOTE — Telephone Encounter (Signed)
Verified that [facility] can indeed see the referral in EPIC under procedures tab.    I was informed that they will be in touch with patient after getting confirmation through patient's insurance.  Called patient and informed her of above.  Glennie Hawk, CMA

## 2022-09-11 NOTE — Telephone Encounter (Signed)
Patient is calling to let us know when she called the Sleep Center she was told they did not receive a referral. They are not able to schedule patient until the referral is placed.   She would like for Dr. Royal Piedra to place referral and for someone to call her when this is done so she can call Sleep Center back to schedule appointment.

## 2022-09-11 NOTE — Telephone Encounter (Signed)
Spoke with patient and updated her on the information for her sleep study.  She can contact [facility].  Burnard Hawthorne   [facility] Address: [address], [place], [place] [address] Phone: [phone]

## 2022-09-19 ENCOUNTER — Other Ambulatory Visit (HOSPITAL_COMMUNITY): Payer: Self-pay

## 2022-09-20 ENCOUNTER — Other Ambulatory Visit (HOSPITAL_COMMUNITY): Payer: Self-pay

## 2022-09-28 NOTE — Progress Notes (Deleted)
  SUBJECTIVE:   CHIEF COMPLAINT / HPI:   ***  PERTINENT  PMH / PSH: HTN, HLD, obesity, OSA on CPAP, T2DM, cardiomegaly, ascending aortic dilation, tobacco use  Patient Care Team: Shelby Mattocks, DO as PCP - General (Family Medicine) Meriam Sprague, MD as PCP - Cardiology (Cardiology) OBJECTIVE:  LMP 11/21/2012  Physical Exam   ASSESSMENT/PLAN:  There are no diagnoses linked to this encounter. No follow-ups on file. Shelby Mattocks, DO 09/28/2022, 11:39 PM PGY-***, [facility] {    This will disappear when note is signed, click to select method of visit    :1}

## 2022-09-30 ENCOUNTER — Ambulatory Visit: Payer: Medicaid Other | Admitting: Student

## 2022-10-16 ENCOUNTER — Ambulatory Visit (HOSPITAL_BASED_OUTPATIENT_CLINIC_OR_DEPARTMENT_OTHER): Payer: Medicaid Other | Attending: Family Medicine | Admitting: Internal Medicine

## 2022-10-16 VITALS — Ht 67.0 in | Wt 300.0 lb

## 2022-10-16 DIAGNOSIS — G4733 Obstructive sleep apnea (adult) (pediatric): Secondary | ICD-10-CM | POA: Diagnosis present

## 2022-10-16 DIAGNOSIS — R0902 Hypoxemia: Secondary | ICD-10-CM | POA: Insufficient documentation

## 2022-10-21 ENCOUNTER — Telehealth (HOSPITAL_COMMUNITY): Payer: Self-pay | Admitting: Physician Assistant

## 2022-10-21 NOTE — Progress Notes (Signed)
  SUBJECTIVE:   CHIEF COMPLAINT / HPI:   Patient presents today for medication refills, to discuss her recent sleep study.  She is amenable to discussion of cessation of smoking at next visit as that is another goal she is trying to achieve.   PERTINENT  PMH / PSH: HTN, HLD, obesity, OSA on CPAP, T2DM, cardiomegaly, ascending aortic dilation, tobacco use  Patient Care Team: Shelby Mattocks, DO as PCP - General (Family Medicine) Meriam Sprague, MD as PCP - Cardiology (Cardiology) OBJECTIVE:  BP 129/81   Pulse 79   Ht 5\' 7"  (1.702 m)   Wt (!) 305 lb 9.6 oz (138.6 kg)   LMP 11/21/2012   SpO2 95%   BMI 47.86 kg/m  Physical Exam Constitutional:      General: She is not in acute distress.    Appearance: Normal appearance.  Cardiovascular:     Rate and Rhythm: Normal rate and regular rhythm.     Heart sounds: Normal heart sounds.  Pulmonary:     Effort: Pulmonary effort is normal.     Breath sounds: Normal breath sounds.  Neurological:     Mental Status: She is alert.    ASSESSMENT/PLAN:  Essential hypertension Assessment & Plan: BP: 129/81 today. Well controlled. Goal of <130/80. Continue to work on healthy dietary habits and exercise. Lab work placed for future given they are closed at this time. Medication regimen: combined medications today to alleviate pill burden.  Orders: -     Losartan Potassium-HCTZ; Take 1 tablet by mouth daily.  Dispense: 90 tablet; Refill: 0 -     amLODIPine-Atorvastatin; Take 1 tablet by mouth daily.  Dispense: 90 tablet; Refill: 0 -     Comprehensive metabolic panel; Future -     Carvedilol; Take 1.5 tablets (37.5 mg total) by mouth 2 (two) times daily with a meal.  Dispense: 180 tablet; Refill: 5  OSA (obstructive sleep apnea) Assessment & Plan: Reviewed recent sleep study, Nadir of 62% O2, significant OSA.   Orders: -     For home use only DME continuous positive airway pressure (CPAP)  Mixed hyperlipidemia -      amLODIPine-Atorvastatin; Take 1 tablet by mouth daily.  Dispense: 90 tablet; Refill: 0 -     Lipid panel; Future  Type 2 diabetes mellitus without complication, without long-term current use of insulin (HCC) -     Hemoglobin A1c; Future  Tobacco abuse Assessment & Plan: She is in the preparation stage. I have provided resources. We will discuss further at next visit in 3-4 weeks.    Encounter for screening mammogram for malignant neoplasm of breast -     Digital Screening Mammogram, Left and Right; Future  Return in about 3 weeks (around 11/12/2022) for tobacco cessation. Shelby Mattocks, DO 10/23/2022, 2:12 PM PGY-2, [facility]

## 2022-10-21 NOTE — Telephone Encounter (Signed)
Patient called and cancelled echocardiogram scheduled for 10/23/22. Patient will call back to reschedule. Order will be removed from the active echo WQ. If patient calls back to reschedule we will reinstate the order. Thank you

## 2022-10-22 ENCOUNTER — Ambulatory Visit (INDEPENDENT_AMBULATORY_CARE_PROVIDER_SITE_OTHER): Payer: Medicaid Other | Admitting: Student

## 2022-10-22 ENCOUNTER — Encounter: Payer: Self-pay | Admitting: Student

## 2022-10-22 VITALS — BP 129/81 | HR 79 | Ht 67.0 in | Wt 305.6 lb

## 2022-10-22 DIAGNOSIS — I10 Essential (primary) hypertension: Secondary | ICD-10-CM | POA: Diagnosis present

## 2022-10-22 DIAGNOSIS — Z1231 Encounter for screening mammogram for malignant neoplasm of breast: Secondary | ICD-10-CM | POA: Diagnosis not present

## 2022-10-22 DIAGNOSIS — E119 Type 2 diabetes mellitus without complications: Secondary | ICD-10-CM | POA: Diagnosis not present

## 2022-10-22 DIAGNOSIS — Z720 Tobacco use: Secondary | ICD-10-CM

## 2022-10-22 DIAGNOSIS — G4733 Obstructive sleep apnea (adult) (pediatric): Secondary | ICD-10-CM

## 2022-10-22 DIAGNOSIS — E782 Mixed hyperlipidemia: Secondary | ICD-10-CM | POA: Diagnosis not present

## 2022-10-22 MED ORDER — LOSARTAN POTASSIUM-HCTZ 100-25 MG PO TABS
1.0000 | ORAL_TABLET | Freq: Every day | ORAL | 0 refills | Status: DC
Start: 2022-10-22 — End: 2022-10-24

## 2022-10-22 MED ORDER — CARVEDILOL 25 MG PO TABS: 37.5000 mg | ORAL_TABLET | Freq: Two times a day (BID) | ORAL | 5 refills | Status: DC

## 2022-10-22 MED ORDER — AMLODIPINE-ATORVASTATIN 5-40 MG PO TABS
1.0000 | ORAL_TABLET | Freq: Every day | ORAL | 0 refills | Status: DC
Start: 2022-10-22 — End: 2022-10-24

## 2022-10-22 NOTE — Patient Instructions (Signed)
It was great to see you today! Thank you for choosing [facility] for your primary care. Virginia Levine was seen for follow-up.  Today we addressed: I combine several of your blood pressure and cholesterol medications together. Please call for your mammogram and do not forget to reschedule for your echocardiogram. I will work on getting you your CPAP stuff. Please schedule a lab appointment to get your labs drawn. Lets talk in 3 to 4 weeks about tobacco cessation  If you haven't already, sign up for My Chart to have easy access to your labs results, and communication with your primary care physician.  You should return to our clinic Return in about 3 weeks (around 11/12/2022) for tobacco cessation. Please arrive 15 minutes before your appointment to ensure smooth check in process.  We appreciate your efforts in making this happen.  Thank you for allowing me to participate in your care, Shelby Mattocks, DO 10/22/2022, 5:03 PM PGY-2, [facility]

## 2022-10-23 ENCOUNTER — Other Ambulatory Visit: Payer: Self-pay | Admitting: Student

## 2022-10-23 ENCOUNTER — Other Ambulatory Visit (HOSPITAL_COMMUNITY): Payer: Self-pay

## 2022-10-23 ENCOUNTER — Other Ambulatory Visit (HOSPITAL_COMMUNITY): Payer: Medicaid Other

## 2022-10-23 DIAGNOSIS — I10 Essential (primary) hypertension: Secondary | ICD-10-CM

## 2022-10-23 MED ORDER — CARVEDILOL 25 MG PO TABS
37.5000 mg | ORAL_TABLET | Freq: Two times a day (BID) | ORAL | 5 refills | Status: DC
Start: 2022-10-23 — End: 2023-07-16
  Filled 2022-10-23 – 2022-11-18 (×2): qty 180, 60d supply, fill #0
  Filled 2023-01-13: qty 180, 60d supply, fill #1
  Filled 2023-03-08: qty 180, 60d supply, fill #2
  Filled 2023-05-22: qty 180, 60d supply, fill #3
  Filled 2023-07-16: qty 180, 60d supply, fill #4

## 2022-10-23 NOTE — Assessment & Plan Note (Signed)
BP: 129/81 today. Well controlled. Goal of <130/80. Continue to work on healthy dietary habits and exercise. Lab work placed for future given they are closed at this time. Medication regimen: combined medications today to alleviate pill burden.

## 2022-10-23 NOTE — Assessment & Plan Note (Signed)
She is in the preparation stage. I have provided resources. We will discuss further at next visit in 3-4 weeks.

## 2022-10-23 NOTE — Assessment & Plan Note (Addendum)
Reviewed recent sleep study, Nadir of 62% O2, significant OSA. I will send in CPAP supplies for her. We will discuss further about weight loss strategies at next visit.

## 2022-10-24 ENCOUNTER — Other Ambulatory Visit: Payer: Self-pay

## 2022-10-24 ENCOUNTER — Other Ambulatory Visit (HOSPITAL_COMMUNITY): Payer: Self-pay

## 2022-10-24 DIAGNOSIS — I10 Essential (primary) hypertension: Secondary | ICD-10-CM

## 2022-10-24 DIAGNOSIS — E782 Mixed hyperlipidemia: Secondary | ICD-10-CM

## 2022-10-24 MED ORDER — AMLODIPINE-ATORVASTATIN 5-40 MG PO TABS
1.0000 | ORAL_TABLET | Freq: Every day | ORAL | 0 refills | Status: DC
Start: 2022-10-24 — End: 2022-10-25
  Filled 2022-10-24: qty 90, 90d supply, fill #0

## 2022-10-24 MED ORDER — LOSARTAN POTASSIUM-HCTZ 100-25 MG PO TABS
1.0000 | ORAL_TABLET | Freq: Every day | ORAL | 0 refills | Status: DC
Start: 2022-10-24 — End: 2023-01-09
  Filled 2022-10-24 – 2022-11-01 (×2): qty 90, 90d supply, fill #0

## 2022-10-24 MED ORDER — ASPIRIN 81 MG PO TBEC
81.0000 mg | DELAYED_RELEASE_TABLET | Freq: Every day | ORAL | 2 refills | Status: DC
  Filled 2022-10-24: qty 30, 30d supply, fill #0
  Filled 2023-03-08: qty 90, 90d supply, fill #0
  Filled 2023-07-16: qty 90, 90d supply, fill #1

## 2022-10-24 NOTE — Telephone Encounter (Signed)
Patient calls nurse line requesting current medications be transferred to [facility].   She reports she has not picked up the new prescriptions of Amlodipine-Atorvastatin or Losartan-HCTZ.   She reports she needs Aspirin transferred as well.   I have called [facility] and cancelled prescriptions.   Will forward to PCP.

## 2022-10-25 ENCOUNTER — Other Ambulatory Visit: Payer: Self-pay | Admitting: Student

## 2022-10-25 ENCOUNTER — Other Ambulatory Visit (HOSPITAL_COMMUNITY): Payer: Self-pay

## 2022-10-25 DIAGNOSIS — I10 Essential (primary) hypertension: Secondary | ICD-10-CM

## 2022-10-25 DIAGNOSIS — E782 Mixed hyperlipidemia: Secondary | ICD-10-CM

## 2022-10-25 MED ORDER — ATORVASTATIN CALCIUM 40 MG PO TABS
40.0000 mg | ORAL_TABLET | Freq: Every day | ORAL | 3 refills | Status: DC
Start: 2022-10-25 — End: 2023-07-16
  Filled 2022-10-25 – 2022-11-18 (×2): qty 90, 90d supply, fill #0
  Filled 2023-02-17: qty 90, 90d supply, fill #1
  Filled 2023-05-22: qty 90, 90d supply, fill #2
  Filled 2023-07-16: qty 90, 90d supply, fill #3

## 2022-10-25 MED ORDER — AMLODIPINE BESYLATE 5 MG PO TABS
5.0000 mg | ORAL_TABLET | Freq: Every day | ORAL | 3 refills | Status: DC
Start: 2022-10-25 — End: 2023-07-17
  Filled 2022-10-25 – 2022-11-18 (×2): qty 90, 90d supply, fill #0
  Filled 2023-02-17 – 2023-03-08 (×2): qty 90, 90d supply, fill #1
  Filled 2023-05-22: qty 90, 90d supply, fill #2

## 2022-10-26 ENCOUNTER — Other Ambulatory Visit (HOSPITAL_COMMUNITY): Payer: Self-pay

## 2022-10-27 DIAGNOSIS — G4733 Obstructive sleep apnea (adult) (pediatric): Secondary | ICD-10-CM

## 2022-10-27 NOTE — Procedures (Signed)
Patient Name: Virginia Levine, Virginia Levine Date: 10/16/2022 Gender: Female D.O.B: [birth year] Age (years): 57 Referring Provider: Pearlean Brownie Height (inches): 67 Interpreting Physician: Jetty Duhamel MD, ABSM Weight (lbs): 300 RPSGT: Shelah Lewandowsky BMI: 47 MRN: [id number] Neck Size: 18.25  CLINICAL INFORMATION The patient is referred for a split night study with BPAP.  MEDICATIONS Medications self-administered by patient taken the night of the study : POTASSIUM CHLORIDE MICRO ER, ROSUVASTATIN  SLEEP STUDY TECHNIQUE As per the AASM Manual for the Scoring of Sleep and Associated Events v2.3 (April 2016) with a hypopnea requiring 4% desaturations.  The channels recorded and monitored were frontal, central and occipital EEG, electrooculogram (EOG), submentalis EMG (chin), nasal and oral airflow, thoracic and abdominal wall motion, anterior tibialis EMG, snore microphone, electrocardiogram, and pulse oximetry. Bi-level positive airway pressure (BiPAP) was initiated when the patient met split night criteria and was titrated according to treat sleep-disordered breathing.  RESPIRATORY PARAMETERS Diagnostic  Total AHI (/hr): 107.5 RDI (/hr): 107.5 OA Index (/hr): 3.4 CA Index (/hr): 0.5 REM AHI (/hr): 115.0 NREM AHI (/hr): 106.7 Supine AHI (/hr): 130.9 Non-supine AHI (/hr): 103.89 Min O2 Sat (%): 67.00 Mean O2 (%): 80.95 Time below 88% (min): 121   Titration  Optimal IPAP Pressure (cm): 19 Optimal EPAP Pressure (cm): 15 AHI at Optimal Pressure (/hr): 6.2 Min O2 at Optimal Pressure (%): 80.0 Sleep % at Optimal (%): 75 Supine % at Optimal (%): 100   SLEEP ARCHITECTURE The study was initiated at 10:03:26 PM and terminated at 4:44:17 AM. The total recorded time was 400.8 minutes. EEG confirmed total sleep time was 361 minutes yielding a sleep efficiency of 90.1%. Sleep onset after lights out was 3.1 minutes with a REM latency of 55.5 minutes. The patient spent 8.59% of the night in  stage N1 sleep, 43.77% in stage N2 sleep, 0.00% in stage N3 and 47.7% in REM. Wake after sleep onset (WASO) was 36.8 minutes. The Arousal Index was 27.3/hour.  LEG MOVEMENT DATA The total Periodic Limb Movements of Sleep (PLMS) were 0. The PLMS index was 0.00 .  CARDIAC DATA The 2 lead EKG demonstrated sinus rhythm. The mean heart rate was 82.82 beats per minute. Other EKG findings include: PVCs.  IMPRESSIONS - Severe obstructive sleep apnea occurred during the diagnostic portion of the study (AHI = 107.5 /hour). - CPAP was not tolerated at effective pressure and was changed to BIPAP titration. An optimal BIPAP pressure was selected for this patient ( 19 /15cwp, EPR 2) - No significant central sleep apnea occurred during the diagnostic portion of the study (CAI = 0.5/hour). - Oxygen desaturation was noted during the diagnostic portion of the study (Min O2 = 67.00%). - Supplemental O2 up to 3L was added per protocol, for hypoxemia persistent despite PAP titration. On BIPAP 19/15 with 3L O2, Mean O2 saturation was still 84.6%. - The patient snored with loud snoring volume during the diagnostic portion of the study. - EKG findings include PVCs. - Clinically significant periodic limb movements of sleep did not occur during the study.  DIAGNOSIS - Obstructive Sleep Apnea (G47.33) - Nocturnal Hypoxemia  RECOMMENDATIONS - Trial of BiPAP therapy on 19/15 cm H2O with a Large size Fisher&Paykel Full Face Evora Full mask and heated humidification. - Supplemental O2 4-5L. Assess adequacy and reasons for hypoxemia. - Be careful with alcohol, sedatives and other CNS depressants that may worsen sleep apnea and disrupt normal sleep architecture. - Sleep hygiene should be reviewed to assess factors that may  improve sleep quality. - Weight management and regular exercise should be initiated or continued.  [Electronically signed] 10/27/2022 09:47 AM  Jetty Duhamel MD, ABSM Diplomate, American Board of  Sleep Medicine NPI: [id number]                      Jetty Duhamel Diplomate, American Board of Sleep Medicine  ELECTRONICALLY SIGNED ON:  10/27/2022, 9:37 AM [facility] PH: [phone]   FX: [phone] ACCREDITED BY THE AMERICAN ACADEMY OF SLEEP MEDICINE

## 2022-11-01 ENCOUNTER — Other Ambulatory Visit (HOSPITAL_COMMUNITY): Payer: Self-pay

## 2022-11-02 ENCOUNTER — Other Ambulatory Visit (HOSPITAL_COMMUNITY): Payer: Self-pay

## 2022-11-07 ENCOUNTER — Ambulatory Visit: Payer: Medicaid Other | Admitting: Podiatry

## 2022-11-08 ENCOUNTER — Ambulatory Visit: Payer: Medicaid Other | Admitting: Podiatry

## 2022-11-12 DIAGNOSIS — Z419 Encounter for procedure for purposes other than remedying health state, unspecified: Secondary | ICD-10-CM

## 2022-11-12 LAB — GLUCOSE, POCT (MANUAL RESULT ENTRY): POC Glucose: 114 mg/dl — AB (ref 70–99)

## 2022-11-12 NOTE — Congregational Nurse Program (Signed)
  Dept: [phone]   Congregational Nurse Program Note  Date of Encounter: 11/12/2022  Clinic visit by staff member at [facility] with complaint of numbness and tingling left foot.  Lt foot warm to touch, equal pulse, no redness, minimal edema as compared to right foot.  States she has had episodes of the numbness and tingling past 3 to 4 days and has never had this before this past weekend. BP 135/82, pulse 83, respirations 16, O2 Sat 92%.  Reports taking all medication as prescribed, smoking no more than 4 cigarettes daily and no alcohol except on weekends when not working.  Blood glucose 114 AC lunch, no complaints of dizziness, chest pain, sweating or nausea.  Recommended calling PCP office to schedule an appointment. 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   Echocardiogram 10/2021: Ascending aorta 39 mm   Cardiomegaly 07/10/2021   Echocardiogram 10/2021: EF 60-65, no RWMA, mod LVH, Gr 1 DD, normal RVSF, normal PASP, severe LAE, mild MR, ascending aorta 39 mm   Coronary artery disease 07/24/2021   CCTA 2/23: CAC score 601 (99th percentile); pLAD 25-49, mLAD 1-24, pD1 25-49, pLCx 25-49, pOM1 1-24; FFR negative; Asc Aorta 3.6 cm; hepatic steatosis    Hyperlipidemia    Hypertension    Obesity    OSA (obstructive sleep apnea)    Pre-diabetes    Tobacco use     Encounter Details:  CNP Questionnaire - 11/12/22 1249       Questionnaire   Ask client: Do you give verbal consent for me to treat you today? Yes    Student Assistance N/A    Location Patient Served  [facility]    Visit Setting with Client Organization    Patient Status Unknown   Has own apartment in [place]    Insurance/Financial Assistance Referral Medicaid    Medication N/A    Medical Provider Yes    Screening Referrals Made N/A    Medical Referrals Made [facility] PCP[facility]    Medical Appointment Made N/A    Recently w/o PCP, now  1st time PCP visit completed due to CNs referral or appointment made N/A    Food N/A    Transportation N/A    Housing/Utilities N/A    Interpersonal Safety N/A    Interventions Advocate/Support;Counsel;Educate    Abnormal to Normal Screening Since Last CN Visit N/A    Screenings CN Performed Blood Pressure;Blood Glucose;Pulse Ox    Sent Client to Lab for: N/A    Did client attend any of the following based off CNs referral or appointments made? N/A    ED Visit Averted Yes    Life-Saving Intervention Made N/A

## 2022-11-14 ENCOUNTER — Ambulatory Visit (INDEPENDENT_AMBULATORY_CARE_PROVIDER_SITE_OTHER): Payer: Medicaid Other | Admitting: Podiatry

## 2022-11-14 DIAGNOSIS — M79675 Pain in left toe(s): Secondary | ICD-10-CM

## 2022-11-14 DIAGNOSIS — E119 Type 2 diabetes mellitus without complications: Secondary | ICD-10-CM

## 2022-11-14 DIAGNOSIS — M79674 Pain in right toe(s): Secondary | ICD-10-CM | POA: Diagnosis not present

## 2022-11-14 DIAGNOSIS — B351 Tinea unguium: Secondary | ICD-10-CM

## 2022-11-14 NOTE — Progress Notes (Signed)
  Subjective:  Patient ID: Virginia Levine, female    DOB: Sep 09, 1965,  MRN: [id number]  Chief Complaint  Patient presents with   Diabetic Foot Care    Patient is present for [facility]. Patient has been swollen lately. Aggravating Factors standing on free for along periods of time. No treatment    57 y.o. female presents with the above complaint. History confirmed with patient. Patient presenting with pain related to dystrophic thickened elongated nails. Patient is unable to trim own nails related to nail dystrophy and/or mobility issues. Patient does  have a history of T2DM.   Objective:  Physical Exam: warm, good capillary refill nail exam normal nails without lesions, onychomycosis of the toenails, onycholysis, and dystrophic nails DP pulses palpable, PT pulses palpable, and protective sensation intact Left Foot:  Pain with palpation of nails due to elongation and dystrophic growth.  Right Foot: Pain with palpation of nails due to elongation and dystrophic growth.   Assessment:   1. Pain due to onychomycosis of toenails of both feet   2. Type 2 diabetes mellitus without complication, without long-term current use of insulin (HCC)       Plan:  Patient was evaluated and treated and all questions answered.  #Onychomycosis with pain  -Nails palliatively debrided as below. -Educated on self-care  Procedure: Nail Debridement Rationale: Pain Type of Debridement: manual, sharp debridement. Instrumentation: Nail nipper, rotary burr. Number of Nails: 10  Return in about 3 months (around 02/14/2023) for [facility].         Corinna Gab, DPM [facility]

## 2022-11-18 ENCOUNTER — Other Ambulatory Visit (HOSPITAL_COMMUNITY): Payer: Self-pay

## 2022-11-18 ENCOUNTER — Other Ambulatory Visit: Payer: Self-pay

## 2022-11-19 ENCOUNTER — Other Ambulatory Visit: Payer: Self-pay

## 2022-11-19 ENCOUNTER — Inpatient Hospital Stay (HOSPITAL_COMMUNITY)
Admission: EM | Admit: 2022-11-19 | Discharge: 2022-11-22 | DRG: 189 | Disposition: A | Discharge status: Home / Self Care | Payer: Medicaid Other | Attending: Family Medicine | Admitting: Family Medicine

## 2022-11-19 ENCOUNTER — Encounter (HOSPITAL_COMMUNITY): Payer: Self-pay

## 2022-11-19 ENCOUNTER — Emergency Department (HOSPITAL_COMMUNITY): Payer: Medicaid Other

## 2022-11-19 DIAGNOSIS — T380X5A Adverse effect of glucocorticoids and synthetic analogues, initial encounter: Secondary | ICD-10-CM | POA: Diagnosis present

## 2022-11-19 DIAGNOSIS — Z7982 Long term (current) use of aspirin: Secondary | ICD-10-CM

## 2022-11-19 DIAGNOSIS — E559 Vitamin D deficiency, unspecified: Secondary | ICD-10-CM | POA: Diagnosis not present

## 2022-11-19 DIAGNOSIS — Z23 Encounter for immunization: Secondary | ICD-10-CM | POA: Diagnosis not present

## 2022-11-19 DIAGNOSIS — R0689 Other abnormalities of breathing: Secondary | ICD-10-CM | POA: Diagnosis not present

## 2022-11-19 DIAGNOSIS — R062 Wheezing: Secondary | ICD-10-CM | POA: Diagnosis not present

## 2022-11-19 DIAGNOSIS — Z6841 Body Mass Index (BMI) 40.0 and over, adult: Secondary | ICD-10-CM | POA: Diagnosis not present

## 2022-11-19 DIAGNOSIS — Z1152 Encounter for screening for COVID-19: Secondary | ICD-10-CM

## 2022-11-19 DIAGNOSIS — Z888 Allergy status to other drugs, medicaments and biological substances status: Secondary | ICD-10-CM

## 2022-11-19 DIAGNOSIS — F17210 Nicotine dependence, cigarettes, uncomplicated: Secondary | ICD-10-CM | POA: Diagnosis present

## 2022-11-19 DIAGNOSIS — Z833 Family history of diabetes mellitus: Secondary | ICD-10-CM

## 2022-11-19 DIAGNOSIS — K760 Fatty (change of) liver, not elsewhere classified: Secondary | ICD-10-CM | POA: Diagnosis present

## 2022-11-19 DIAGNOSIS — E785 Hyperlipidemia, unspecified: Secondary | ICD-10-CM | POA: Diagnosis present

## 2022-11-19 DIAGNOSIS — G4733 Obstructive sleep apnea (adult) (pediatric): Secondary | ICD-10-CM | POA: Diagnosis present

## 2022-11-19 DIAGNOSIS — Z720 Tobacco use: Secondary | ICD-10-CM | POA: Diagnosis present

## 2022-11-19 DIAGNOSIS — I10 Essential (primary) hypertension: Secondary | ICD-10-CM | POA: Diagnosis not present

## 2022-11-19 DIAGNOSIS — I2510 Atherosclerotic heart disease of native coronary artery without angina pectoris: Secondary | ICD-10-CM | POA: Diagnosis not present

## 2022-11-19 DIAGNOSIS — J9601 Acute respiratory failure with hypoxia: Principal | ICD-10-CM | POA: Diagnosis present

## 2022-11-19 DIAGNOSIS — K219 Gastro-esophageal reflux disease without esophagitis: Secondary | ICD-10-CM | POA: Diagnosis present

## 2022-11-19 DIAGNOSIS — R0602 Shortness of breath: Secondary | ICD-10-CM | POA: Diagnosis not present

## 2022-11-19 DIAGNOSIS — E119 Type 2 diabetes mellitus without complications: Secondary | ICD-10-CM | POA: Diagnosis present

## 2022-11-19 DIAGNOSIS — E876 Hypokalemia: Secondary | ICD-10-CM | POA: Diagnosis present

## 2022-11-19 DIAGNOSIS — J45909 Unspecified asthma, uncomplicated: Secondary | ICD-10-CM | POA: Diagnosis present

## 2022-11-19 DIAGNOSIS — Z79899 Other long term (current) drug therapy: Secondary | ICD-10-CM

## 2022-11-19 DIAGNOSIS — Z8249 Family history of ischemic heart disease and other diseases of the circulatory system: Secondary | ICD-10-CM

## 2022-11-19 DIAGNOSIS — R231 Pallor: Secondary | ICD-10-CM | POA: Diagnosis not present

## 2022-11-19 DIAGNOSIS — R0902 Hypoxemia: Secondary | ICD-10-CM | POA: Diagnosis not present

## 2022-11-19 HISTORY — DX: Vitamin D deficiency, unspecified: E55.9

## 2022-11-19 LAB — GLUCOSE, CAPILLARY: Glucose-Capillary: 225 mg/dL — ABNORMAL HIGH (ref 70–99)

## 2022-11-19 LAB — HEPATIC FUNCTION PANEL
ALT: 24 U/L (ref 0–44)
AST: 21 U/L (ref 15–41)
Albumin: 3.9 g/dL (ref 3.5–5.0)
Alkaline Phosphatase: 70 U/L (ref 38–126)
Bilirubin, Direct: 0.2 mg/dL (ref 0.0–0.2)
Indirect Bilirubin: 0.7 mg/dL (ref 0.3–0.9)
Total Bilirubin: 0.9 mg/dL (ref 0.3–1.2)
Total Protein: 7 g/dL (ref 6.5–8.1)

## 2022-11-19 LAB — CBC WITH DIFFERENTIAL/PLATELET
Abs Immature Granulocytes: 0.02 10*3/uL (ref 0.00–0.07)
Basophils Absolute: 0 10*3/uL (ref 0.0–0.1)
Basophils Relative: 0 %
Eosinophils Absolute: 0.1 10*3/uL (ref 0.0–0.5)
Eosinophils Relative: 1 %
HCT: 44.1 % (ref 36.0–46.0)
Hemoglobin: 14.3 g/dL (ref 12.0–15.0)
Immature Granulocytes: 0 %
Lymphocytes Relative: 12 %
Lymphs Abs: 1 10*3/uL (ref 0.7–4.0)
MCH: 33 pg (ref 26.0–34.0)
MCHC: 32.4 g/dL (ref 30.0–36.0)
MCV: 101.8 fL — ABNORMAL HIGH (ref 80.0–100.0)
Monocytes Absolute: 0.7 10*3/uL (ref 0.1–1.0)
Monocytes Relative: 9 %
Neutro Abs: 6.1 10*3/uL (ref 1.7–7.7)
Neutrophils Relative %: 78 %
Platelets: 156 10*3/uL (ref 150–400)
RBC: 4.33 MIL/uL (ref 3.87–5.11)
RDW: 13.2 % (ref 11.5–15.5)
WBC: 7.9 10*3/uL (ref 4.0–10.5)
nRBC: 0 % (ref 0.0–0.2)

## 2022-11-19 LAB — BASIC METABOLIC PANEL
Anion gap: 3 — ABNORMAL LOW (ref 5–15)
Anion gap: 9 (ref 5–15)
BUN: 8 mg/dL (ref 6–20)
BUN: 11 mg/dL (ref 6–20)
CO2: 22 mmol/L (ref 22–32)
CO2: 26 mmol/L (ref 22–32)
Calcium: 5.6 mg/dL — CL (ref 8.9–10.3)
Calcium: 9.2 mg/dL (ref 8.9–10.3)
Chloride: 118 mmol/L — ABNORMAL HIGH (ref 98–111)
Chloride: 104 mmol/L (ref 98–111)
Creatinine, Ser: <0.3 mg/dL — ABNORMAL LOW (ref 0.44–1.00)
Creatinine, Ser: 0.66 mg/dL (ref 0.44–1.00)
GFR, Estimated: >60 mL/min (ref >60)
Glucose, Bld: 132 mg/dL — ABNORMAL HIGH (ref 70–99)
Glucose, Bld: 188 mg/dL — ABNORMAL HIGH (ref 70–99)
Potassium: 2.3 mmol/L — CL (ref 3.5–5.1)
Potassium: 4.3 mmol/L (ref 3.5–5.1)
Sodium: 143 mmol/L (ref 135–145)
Sodium: 139 mmol/L (ref 135–145)

## 2022-11-19 LAB — RESP PANEL BY RT-PCR (RSV, FLU A&B, COVID)  RVPGX2
Influenza A by PCR: NEGATIVE
Influenza B by PCR: NEGATIVE
Resp Syncytial Virus by PCR: NEGATIVE
SARS Coronavirus 2 by RT PCR: NEGATIVE

## 2022-11-19 LAB — MAGNESIUM: Magnesium: 1.5 mg/dL — ABNORMAL LOW (ref 1.7–2.4)

## 2022-11-19 LAB — PHOSPHORUS: Phosphorus: 2.5 mg/dL (ref 2.5–4.6)

## 2022-11-19 LAB — TROPONIN I (HIGH SENSITIVITY)
Troponin I (High Sensitivity): 5 ng/L (ref <18)
Troponin I (High Sensitivity): 7 ng/L

## 2022-11-19 LAB — VITAMIN D 25 HYDROXY (VIT D DEFICIENCY, FRACTURES): Vit D, 25-Hydroxy: 6.47 ng/mL — ABNORMAL LOW (ref 30–100)

## 2022-11-19 LAB — BRAIN NATRIURETIC PEPTIDE: B Natriuretic Peptide: 73.3 pg/mL (ref 0.0–100.0)

## 2022-11-19 LAB — D-DIMER, QUANTITATIVE: D-Dimer, Quant: 0.3 ug/mL-FEU (ref 0.00–0.50)

## 2022-11-19 MED ORDER — MAGNESIUM SULFATE 2 GM/50ML IV SOLN
2.0000 g | Freq: Once | INTRAVENOUS | Status: AC
  Administered 2022-11-19: 2 g via INTRAVENOUS
  Filled 2022-11-19: qty 50

## 2022-11-19 MED ORDER — ACETAMINOPHEN 325 MG PO TABS: 650.0000 mg | ORAL_TABLET | Freq: Four times a day (QID) | ORAL | Status: DC | PRN

## 2022-11-19 MED ORDER — ORAL CARE MOUTH RINSE: 15.0000 mL | OROMUCOSAL | Status: DC | PRN

## 2022-11-19 MED ORDER — POTASSIUM CHLORIDE 10 MEQ/100ML IV SOLN
10.0000 meq | INTRAVENOUS | Status: AC
  Administered 2022-11-19 (×4): 10 meq via INTRAVENOUS
  Filled 2022-11-19 (×4): qty 100

## 2022-11-19 MED ORDER — POTASSIUM CHLORIDE CRYS ER 20 MEQ PO TBCR
20.0000 meq | EXTENDED_RELEASE_TABLET | Freq: Two times a day (BID) | ORAL | Status: DC
  Administered 2022-11-19 – 2022-11-22 (×6): 20 meq via ORAL
  Filled 2022-11-19 (×6): qty 1

## 2022-11-19 MED ORDER — LOSARTAN POTASSIUM-HCTZ 100-25 MG PO TABS: 1.0000 | ORAL_TABLET | Freq: Every day | ORAL | Status: DC

## 2022-11-19 MED ORDER — IPRATROPIUM-ALBUTEROL 0.5-2.5 (3) MG/3ML IN SOLN
3.0000 mL | Freq: Four times a day (QID) | RESPIRATORY_TRACT | Status: DC
  Administered 2022-11-19 (×3): 3 mL via RESPIRATORY_TRACT
  Filled 2022-11-19 (×4): qty 3

## 2022-11-19 MED ORDER — ATORVASTATIN CALCIUM 40 MG PO TABS
40.0000 mg | ORAL_TABLET | Freq: Every day | ORAL | Status: DC
  Administered 2022-11-19 – 2022-11-21 (×3): 40 mg via ORAL
  Filled 2022-11-19 (×4): qty 1

## 2022-11-19 MED ORDER — PREDNISONE 20 MG PO TABS
40.0000 mg | ORAL_TABLET | Freq: Every day | ORAL | Status: DC
  Administered 2022-11-20: 40 mg via ORAL
  Filled 2022-11-19: qty 2

## 2022-11-19 MED ORDER — IPRATROPIUM-ALBUTEROL 0.5-2.5 (3) MG/3ML IN SOLN
3.0000 mL | Freq: Once | RESPIRATORY_TRACT | Status: AC
  Administered 2022-11-19: 3 mL via RESPIRATORY_TRACT
  Filled 2022-11-19: qty 3

## 2022-11-19 MED ORDER — NICOTINE 14 MG/24HR TD PT24: 14.0000 mg | MEDICATED_PATCH | Freq: Every day | TRANSDERMAL | Status: DC | PRN

## 2022-11-19 MED ORDER — MELATONIN 5 MG PO TABS
5.0000 mg | ORAL_TABLET | Freq: Every evening | ORAL | Status: DC | PRN
  Administered 2022-11-19: 5 mg via ORAL
  Filled 2022-11-19: qty 1

## 2022-11-19 MED ORDER — ENOXAPARIN SODIUM 80 MG/0.8ML IJ SOSY
70.0000 mg | PREFILLED_SYRINGE | INTRAMUSCULAR | Status: DC
  Administered 2022-11-19 – 2022-11-21 (×3): 70 mg via SUBCUTANEOUS
  Filled 2022-11-19 (×4): qty 0.8

## 2022-11-19 MED ORDER — METHYLPREDNISOLONE SODIUM SUCC 125 MG IJ SOLR
125.0000 mg | Freq: Once | INTRAMUSCULAR | Status: AC
  Administered 2022-11-19: 125 mg via INTRAVENOUS
  Filled 2022-11-19: qty 2

## 2022-11-19 MED ORDER — POTASSIUM CHLORIDE CRYS ER 20 MEQ PO TBCR
40.0000 meq | EXTENDED_RELEASE_TABLET | Freq: Once | ORAL | Status: AC
  Administered 2022-11-19: 40 meq via ORAL
  Filled 2022-11-19: qty 2

## 2022-11-19 MED ORDER — AMLODIPINE BESYLATE 10 MG PO TABS
5.0000 mg | ORAL_TABLET | Freq: Every day | ORAL | Status: DC
  Administered 2022-11-19 – 2022-11-21 (×3): 5 mg via ORAL
  Filled 2022-11-19 (×3): qty 1

## 2022-11-19 MED ORDER — INSULIN ASPART 100 UNIT/ML IJ SOLN
0.0000 [IU] | Freq: Three times a day (TID) | INTRAMUSCULAR | Status: DC
  Administered 2022-11-20: 4 [IU] via SUBCUTANEOUS
  Administered 2022-11-20: 7 [IU] via SUBCUTANEOUS
  Administered 2022-11-21: 4 [IU] via SUBCUTANEOUS
  Administered 2022-11-21 – 2022-11-22 (×3): 7 [IU] via SUBCUTANEOUS

## 2022-11-19 MED ORDER — HYDROCHLOROTHIAZIDE 25 MG PO TABS
25.0000 mg | ORAL_TABLET | Freq: Every day | ORAL | Status: DC
  Administered 2022-11-20 – 2022-11-22 (×3): 25 mg via ORAL
  Filled 2022-11-19 (×3): qty 1

## 2022-11-19 MED ORDER — ONDANSETRON HCL 4 MG PO TABS: 4.0000 mg | ORAL_TABLET | Freq: Four times a day (QID) | ORAL | Status: DC | PRN

## 2022-11-19 MED ORDER — ASPIRIN 81 MG PO TBEC
81.0000 mg | DELAYED_RELEASE_TABLET | Freq: Every day | ORAL | Status: DC
  Administered 2022-11-19 – 2022-11-22 (×4): 81 mg via ORAL
  Filled 2022-11-19 (×4): qty 1

## 2022-11-19 MED ORDER — CALCIUM GLUCONATE-NACL 1-0.675 GM/50ML-% IV SOLN
1.0000 g | Freq: Once | INTRAVENOUS | Status: AC
  Administered 2022-11-19: 1000 mg via INTRAVENOUS
  Filled 2022-11-19 (×2): qty 50

## 2022-11-19 MED ORDER — ACETAMINOPHEN 650 MG RE SUPP: 650.0000 mg | Freq: Four times a day (QID) | RECTAL | Status: DC | PRN

## 2022-11-19 MED ORDER — VITAMIN D (ERGOCALCIFEROL) 1.25 MG (50000 UNIT) PO CAPS
50000.0000 [IU] | ORAL_CAPSULE | Freq: Once | ORAL | Status: AC
  Administered 2022-11-19: 50000 [IU] via ORAL
  Filled 2022-11-19: qty 1

## 2022-11-19 MED ORDER — CARVEDILOL 25 MG PO TABS
37.5000 mg | ORAL_TABLET | Freq: Two times a day (BID) | ORAL | Status: DC
  Administered 2022-11-19 – 2022-11-22 (×6): 37.5 mg via ORAL
  Filled 2022-11-19 (×6): qty 1

## 2022-11-19 MED ORDER — ENOXAPARIN SODIUM 40 MG/0.4ML IJ SOSY: 40.0000 mg | PREFILLED_SYRINGE | INTRAMUSCULAR | Status: DC

## 2022-11-19 MED ORDER — LOSARTAN POTASSIUM 50 MG PO TABS
100.0000 mg | ORAL_TABLET | Freq: Every day | ORAL | Status: DC
  Administered 2022-11-20 – 2022-11-22 (×3): 100 mg via ORAL
  Filled 2022-11-19 (×3): qty 2

## 2022-11-19 MED ORDER — ALBUTEROL SULFATE (2.5 MG/3ML) 0.083% IN NEBU: 2.5000 mg | INHALATION_SOLUTION | RESPIRATORY_TRACT | Status: DC | PRN

## 2022-11-19 MED ORDER — ONDANSETRON HCL 4 MG/2ML IJ SOLN: 4.0000 mg | Freq: Four times a day (QID) | INTRAMUSCULAR | Status: DC | PRN

## 2022-11-19 NOTE — ED Triage Notes (Signed)
Pt BIB EMS with reports of SHOB x 2 days. Pt lives on the 2nd floor and has been having labored breathing when walking up the stairs. Pt states that this feels like when she has had PNA. 10 of albuterol given, 0.5 Atrovent neb, 125 mg Solumedrol, 2 g magnesium given. 20 g left wrist. SPO2 room air 80%. 98% on the neb.

## 2022-11-19 NOTE — Progress Notes (Signed)
Pt stated she wears cpap, not bipap, at night for her sleep apnea.  Cpap setup at bedside but pt is not ready to go to bed yet.  RN will notify this Clinical research associate when pt is ready for assistance with cpap.

## 2022-11-19 NOTE — ED Notes (Signed)
ED TO INPATIENT HANDOFF REPORT  Name/Age/Gender [name] 57 y.o. female  Code Status    Code Status Orders  (From admission, onward)           Start     Ordered   11/19/22 0828  Full code  Continuous       Question:  By:  Answer:  Consent: discussion documented in EHR   11/19/22 0828           Code Status History     This patient has a current code status but no historical code status.       Home/SNF/Other Home  Chief Complaint Acute respiratory failure with hypoxia (HCC) [J96.01]  Level of Care/Admitting Diagnosis ED Disposition     ED Disposition  Admit   Condition  --   Comment  Hospital Area: [facility] [[id number]]  Level of Care: Progressive [102]  Admit to Progressive based on following criteria: MULTISYSTEM THREATS such as stable sepsis, metabolic/electrolyte imbalance with or without encephalopathy that is responding to early treatment.  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   Echocardiogram 10/2021: Ascending aorta 39 mm   Cardiomegaly 07/10/2021   Echocardiogram 10/2021: EF 60-65, no RWMA, mod LVH, Gr 1 DD, normal RVSF, normal PASP, severe LAE, mild MR, ascending aorta 39 mm   Coronary artery disease 07/24/2021   CCTA 2/23: CAC score 601 (99th percentile); pLAD 25-49, mLAD 1-24, pD1 25-49, pLCx 25-49, pOM1 1-24; FFR negative; Asc Aorta 3.6 cm; hepatic steatosis    Hyperlipidemia    Hypertension    Obesity    OSA (obstructive  sleep apnea)    Pre-diabetes    Tobacco use     Allergies Allergies  Allergen Reactions   Lisinopril Other (See Comments)    Angioedema / Hair loss/burned scalp    IV Location/Drains/Wounds Patient Lines/Drains/Airways Status     Active Line/Drains/Airways     Name Placement date Placement time Site Days   Peripheral IV 11/19/22 20 G Left;Posterior Wrist 11/19/22  0729  Wrist  less than 1            Labs/Imaging Results for orders placed or performed during the hospital encounter of 11/19/22 (from the past 48 hour(s))  CBC with Differential     Status: Abnormal   Collection Time: 11/19/22  4:50 AM  Result Value Ref Range   WBC 7.9 4.0 - 10.5 K/uL   RBC 4.33 3.87 - 5.11 MIL/uL   Hemoglobin 14.3 12.0 - 15.0 g/dL   HCT 65.7 84.6 - 96.2 %   MCV 101.8 (H) 80.0 - 100.0 fL   MCH 33.0 26.0 - 34.0 pg   MCHC 32.4 30.0 - 36.0 g/dL   RDW 95.2 84.1 - 32.4 %   Platelets 156 150 - 400 K/uL   nRBC 0.0 0.0 - 0.2 %   Neutrophils Relative % 78 %   Neutro Abs 6.1 1.7 - 7.7 K/uL   Lymphocytes Relative 12 %   Lymphs Abs 1.0 0.7 -  4.0 K/uL   Monocytes Relative 9 %   Monocytes Absolute 0.7 0.1 - 1.0 K/uL   Eosinophils Relative 1 %   Eosinophils Absolute 0.1 0.0 - 0.5 K/uL   Basophils Relative 0 %   Basophils Absolute 0.0 0.0 - 0.1 K/uL   Immature Granulocytes 0 %   Abs Immature Granulocytes 0.02 0.00 - 0.07 K/uL    Comment: Performed at [facility], [address] [place], [place] [address]  Brain natriuretic peptide     Status: None   Collection Time: 11/19/22  4:50 AM  Result Value Ref Range   B Natriuretic Peptide 73.3 0.0 - 100.0 pg/mL    Comment: Performed at [facility], [address] [place], [place] [address]  Resp panel by RT-PCR (RSV, Flu A&B, Covid) Anterior Nasal Swab     Status: None   Collection Time: 11/19/22  4:50 AM   Specimen: Anterior Nasal Swab  Result Value Ref Range   SARS Coronavirus 2 by RT PCR NEGATIVE NEGATIVE     Comment: (NOTE) SARS-CoV-2 target nucleic acids are NOT DETECTED.  The SARS-CoV-2 RNA is generally detectable in upper respiratory specimens during the acute phase of infection. The lowest concentration of SARS-CoV-2 viral copies this assay can detect is 138 copies/mL. A negative result does not preclude SARS-Cov-2 infection and should not be used as the sole basis for treatment or other patient management decisions. A negative result may occur with  improper specimen collection/handling, submission of specimen other than nasopharyngeal swab, presence of viral mutation(s) within the areas targeted by this assay, and inadequate number of viral copies(<138 copies/mL). A negative result must be combined with clinical observations, patient history, and epidemiological information. The expected result is Negative.  Fact Sheet for Patients:  BloggerCourse.com  Fact Sheet for Healthcare Providers:  SeriousBroker.it  This test is no t yet approved or cleared by the [place] FDA and  has been authorized for detection and/or diagnosis of SARS-CoV-2 by FDA under an Emergency Use Authorization (EUA). This EUA will remain  in effect (meaning this test can be used) for the duration of the COVID-19 declaration under Section 564(b)(1) of the Act, 21 U.S.C.section 360bbb-3(b)(1), unless the authorization is terminated  or revoked sooner.       Influenza A by PCR NEGATIVE NEGATIVE   Influenza B by PCR NEGATIVE NEGATIVE    Comment: (NOTE) The Xpert Xpress SARS-CoV-2/FLU/RSV plus assay is intended as an aid in the diagnosis of influenza from Nasopharyngeal swab specimens and should not be used as a sole basis for treatment. Nasal washings and aspirates are unacceptable for Xpert Xpress SARS-CoV-2/FLU/RSV testing.  Fact Sheet for Patients: BloggerCourse.com  Fact Sheet for Healthcare  Providers: SeriousBroker.it  This test is not yet approved or cleared by the [place] FDA and has been authorized for detection and/or diagnosis of SARS-CoV-2 by FDA under an Emergency Use Authorization (EUA). This EUA will remain in effect (meaning this test can be used) for the duration of the COVID-19 declaration under Section 564(b)(1) of the Act, 21 U.S.C. section 360bbb-3(b)(1), unless the authorization is terminated or revoked.     Resp Syncytial Virus by PCR NEGATIVE NEGATIVE    Comment: (NOTE) Fact Sheet for Patients: BloggerCourse.com  Fact Sheet for Healthcare Providers: SeriousBroker.it  This test is not yet approved or cleared by the [place] FDA and has been authorized for detection and/or diagnosis of SARS-CoV-2 by FDA under an Emergency Use Authorization (EUA). This EUA will remain in  effect (meaning this test can be used) for the duration of the COVID-19 declaration under Section 564(b)(1) of the Act, 21 U.S.C. section 360bbb-3(b)(1), unless the authorization is terminated or revoked.  Performed at [facility], [address] [place], [place] [address]   VITAMIN D 25 Hydroxy (Vit-D Deficiency, Fractures)     Status: Abnormal   Collection Time: 11/19/22  4:50 AM  Result Value Ref Range   Vit D, 25-Hydroxy 6.47 (L) 30 - 100 ng/mL    Comment: (NOTE) Vitamin D deficiency has been defined by the Institute of Medicine  and an Endocrine Society practice guideline as a level of serum 25-OH  vitamin D less than 20 ng/mL (1,2). The Endocrine Society went on to  further define vitamin D insufficiency as a level between 21 and 29  ng/mL (2).  1. IOM (Institute of Medicine). 2010. Dietary reference intakes for  calcium and D. [place]: [employer]. 2. Holick MF, Binkley Baskin, Bischoff-Ferrari HA, et al. Evaluation,  treatment, and prevention of  vitamin D deficiency: an Endocrine  Society clinical practice guideline, JCEM. 2011 Jul; 96(7): 1911-30.  Performed at [facility], [address] [place], [place] [address]   D-dimer, quantitative     Status: None   Collection Time: 11/19/22  4:50 AM  Result Value Ref Range   D-Dimer, Quant 0.30 0.00 - 0.50 ug/mL-FEU    Comment: (NOTE) At the manufacturer cut-off value of 0.5 g/mL FEU, this assay has a negative predictive value of 95-100%.This assay is intended for use in conjunction with a clinical pretest probability (PTP) assessment model to exclude pulmonary embolism (PE) and deep venous thrombosis (DVT) in outpatients suspected of PE or DVT. Results should be correlated with clinical presentation. Performed at [facility], [address] [place], [place] [address]   Basic metabolic panel     Status: Abnormal   Collection Time: 11/19/22  6:00 AM  Result Value Ref Range   Sodium 143 135 - 145 mmol/L   Potassium 2.3 (LL) 3.5 - 5.1 mmol/L    Comment: CRITICAL RESULT CALLED TO, READ BACK BY AND VERIFIED WITH BRATU, D. EMT, AT 2841 ON 11/19/2022 BY MECIAL J.    Chloride 118 (H) 98 - 111 mmol/L   CO2 22 22 - 32 mmol/L   Glucose, Bld 132 (H) 70 - 99 mg/dL    Comment: Glucose reference range applies only to samples taken after fasting for at least 8 hours.   BUN 8 6 - 20 mg/dL   Creatinine, Ser <3.24 (L) 0.44 - 1.00 mg/dL   Calcium 5.6 (LL) 8.9 - 10.3 mg/dL    Comment: CRITICAL RESULT CALLED TO, READ BACK BY AND VERIFIED WITH BRATU, D. EMT, AT 4010 ON 11/19/2022 BY MECIAL J.    GFR, Estimated NOT CALCULATED >60 mL/min    Comment: (NOTE) Calculated using the CKD-EPI Creatinine Equation (2021)    Anion gap 3 (L) 5 - 15    Comment: Performed at [facility], [address], [place], [place] [address]  Troponin I (High Sensitivity)     Status: None   Collection Time: 11/19/22  6:00 AM  Result Value Ref Range   Troponin I (High  Sensitivity) 5 <18 ng/L    Comment: (NOTE) Elevated high sensitivity troponin I (hsTnI) values and significant  changes across serial measurements may suggest ACS but many other  chronic and acute conditions are known to elevate hsTnI results.  Refer to  the "Links" section for chest pain algorithms and additional  guidance. Performed at [facility], [address], [place], [place] [address]   Magnesium     Status: Abnormal   Collection Time: 11/19/22  6:00 AM  Result Value Ref Range   Magnesium 1.5 (L) 1.7 - 2.4 mg/dL    Comment: Performed at [facility], [address], [place], [place] [address]  Phosphorus     Status: None   Collection Time: 11/19/22  6:00 AM  Result Value Ref Range   Phosphorus 2.5 2.5 - 4.6 mg/dL    Comment: Performed at [facility], [address], [place], [place] [address]  Troponin I (High Sensitivity)     Status: None   Collection Time: 11/19/22  7:39 AM  Result Value Ref Range   Troponin I (High Sensitivity) 7 <18 ng/L    Comment: (NOTE) Elevated high sensitivity troponin I (hsTnI) values and significant  changes across serial measurements may suggest ACS but many other  chronic and acute conditions are known to elevate hsTnI results.  Refer to the "Links" section for chest pain algorithms and additional  guidance. Performed at [facility], [address] [place], [place] [address]   Hepatic function panel     Status: None   Collection Time: 11/19/22  7:39 AM  Result Value Ref Range   Total Protein 7.0 6.5 - 8.1 g/dL   Albumin 3.9 3.5 - 5.0 g/dL   AST 21 15 - 41 U/L   ALT 24 0 - 44 U/L   Alkaline Phosphatase 70 38 - 126 U/L   Total Bilirubin 0.9 0.3 - 1.2 mg/dL   Bilirubin, Direct 0.2 0.0 - 0.2 mg/dL   Indirect Bilirubin 0.7 0.3 - 0.9 mg/dL    Comment: Performed at [facility], [address] [place], [place] [address]   DG Chest Port 1  View  Result Date: 11/19/2022 CLINICAL DATA:  Shortness of breath EXAM: PORTABLE CHEST 1 VIEW COMPARISON:  06/06/2021 FINDINGS: Interstitial coarsening is similar to prior. There is no edema, consolidation, effusion, or pneumothorax. Normal heart size and mediastinal contours for technique. Limited chest due to low volumes and kyphotic positioning. IMPRESSION: No acute finding when compared to prior. Electronically Signed   By: Tiburcio Pea M.D.   On: 11/19/2022 05:20    Pending Labs Unresulted Labs (From admission, onward)     Start     Ordered   11/20/22 0500  HIV Antibody (routine testing w rflx)  (HIV Antibody (Routine testing w reflex) panel)  Tomorrow morning,   R        11/19/22 0828   11/20/22 0500  CBC  Tomorrow morning,   R        11/19/22 0828   11/20/22 0500  Comprehensive metabolic panel  Tomorrow morning,   R        11/19/22 0828   11/19/22 1200  Basic metabolic panel  Once-Timed,   TIMED        11/19/22 0829            Vitals/Pain Today's Vitals   11/19/22 0445 11/19/22 0700 11/19/22 0837 11/19/22 1104  BP: (!) 142/78   (!) 155/91  Pulse: 91 75  76  Resp: (!) 24     Temp: 98.7 F (37.1 C)  98.2 F (36.8 C) 98.6 F (37 C)  TempSrc:   Oral   SpO2: 97% 92%    Weight:  Height:      PainSc:        Isolation Precautions No active isolations  Medications Medications  potassium chloride 10 mEq in 100 mL IVPB (10 mEq Intravenous New Bag/Given 11/19/22 1105)  calcium gluconate 1 g/ 50 mL sodium chloride IVPB (has no administration in time range)  potassium chloride SA (KLOR-CON M) CR tablet 40 mEq (has no administration in time range)  acetaminophen (TYLENOL) tablet 650 mg (has no administration in time range)    Or  acetaminophen (TYLENOL) suppository 650 mg (has no administration in time range)  ondansetron (ZOFRAN) tablet 4 mg (has no administration in time range)    Or  ondansetron (ZOFRAN) injection 4 mg (has no administration in time range)   enoxaparin (LOVENOX) injection 70 mg (has no administration in time range)  losartan-hydrochlorothiazide (HYZAAR) 100-25 MG per tablet 1 tablet (has no administration in time range)  carvedilol (COREG) tablet 37.5 mg (has no administration in time range)  atorvastatin (LIPITOR) tablet 40 mg (has no administration in time range)  aspirin EC tablet 81 mg (has no administration in time range)  amLODipine (NORVASC) tablet 5 mg (has no administration in time range)  potassium chloride SA (KLOR-CON M) CR tablet 20 mEq (has no administration in time range)  nicotine (NICODERM CQ - dosed in mg/24 hours) patch 14 mg (has no administration in time range)  ipratropium-albuterol (DUONEB) 0.5-2.5 (3) MG/3ML nebulizer solution 3 mL (3 mLs Nebulization Given 11/19/22 1105)  albuterol (PROVENTIL) (2.5 MG/3ML) 0.083% nebulizer solution 2.5 mg (has no administration in time range)  predniSONE (DELTASONE) tablet 40 mg (has no administration in time range)  ipratropium-albuterol (DUONEB) 0.5-2.5 (3) MG/3ML nebulizer solution 3 mL (3 mLs Nebulization Given 11/19/22 0602)  potassium chloride SA (KLOR-CON M) CR tablet 40 mEq (40 mEq Oral Given 11/19/22 0742)  magnesium sulfate IVPB 2 g 50 mL (0 g Intravenous Stopped 11/19/22 0954)  methylPREDNISolone sodium succinate (SOLU-MEDROL) 125 mg/2 mL injection 125 mg (125 mg Intravenous Given 11/19/22 1102)    Mobility walks

## 2022-11-19 NOTE — ED Provider Notes (Signed)
  Physical Exam  BP (!) 142/78 (BP Location: Right Arm)   Pulse 75   Temp 98.7 F (37.1 C)   Resp (!) 24   Ht 5\' 7"  (1.702 m)   Wt (!) 138.6 kg   LMP 11/21/2012   SpO2 92%   BMI 47.86 kg/m   Physical Exam  Procedures  Procedures  ED Course / MDM    Medical Decision Making Amount and/or Complexity of Data Reviewed Labs: ordered. Radiology: ordered. ECG/medicine tests: ordered.  Risk Prescription drug management. Decision regarding hospitalization.   Patient care assumed at shift handoff from Sharilyn Sites, PA-C.  Please see her note for full details  In short, 57 year old female patient with history of hypertension, hyperlipidemia, sleep apnea, GERD, diabetes presents with 2 days of shortness of breath.  She states that the shortness of breath worsens with exertion.  EMS arrived and noticed that her room air oxygen saturation was 80%.  She does not use oxygen at baseline.  Patient told EMS she felt she may have a pneumonia.  EMS administered magnesium, albuterol, Atrovent, and Solu-Medrol due to wheezing.  Patient denied chest pain.  After DuoNeb patient was still at 80% on room air.  Patient had minimal wheezing.  She did endorse not using CPAP for the past several days due to a broken machine.  The patient was placed on 2 L of oxygen which initially kept her above 90% but eventually required 4 L of oxygen to maintain saturations above 90%.  At time of shift handoff plans were for admission but the basic metabolic panel and troponin had hemolyzed and is requiring recollection.  Paramedic notified me that patient's potassium was 2.3 and calcium was 5.6.  Potassium and calcium supplementation ordered.  Hepatic function panel also ordered to check albumin.  Requested consultation with the hospitalist service. Dr.Ortiz agrees to see patient for admission       Virginia Levine 11/19/22 1610    Virginia Loveless, MD 11/21/22 [phone]

## 2022-11-19 NOTE — Progress Notes (Signed)
   11/19/22 2339  BiPAP/CPAP/SIPAP  $ Non-Invasive Home Ventilator  Initial  BiPAP/CPAP/SIPAP Pt Type Adult  BiPAP/CPAP/SIPAP DREAMSTATIOND  Mask Type Nasal mask  Mask Size Medium  Respiratory Rate 20 breaths/min  Flow Rate 4 lpm  Patient Home Equipment No  Auto Titrate Yes (automode, min6cm per pt comfort/request, max20cm h2o)  CPAP/SIPAP surface wiped down Yes  BiPAP/CPAP /SiPAP Vitals  Pulse Rate 81  Resp 20  SpO2 93 %

## 2022-11-19 NOTE — ED Provider Notes (Signed)
[place] EMERGENCY DEPARTMENT AT [facility] Provider Note   CSN: [id number] Arrival date & time: 11/19/22  0431     History  Chief Complaint  Patient presents with   Shortness of Breath    Virginia Levine is a 57 y.o. female.  The history is provided by the patient and medical records.  Shortness of Breath Associated symptoms: wheezing    58 year old female with history of hypertension, hyperlipidemia, sleep apnea, left depression, GERD, diabetes, presenting to the ED with shortness of breath of the past 2 days.  States symptoms are progressively worsening, more so with exertion.  She lives alone second floor apartment and had much increased shortness of breath after walking up the stairs.  Upon EMS arrival sats were 80% on room air.  She is not generally oxygen dependent.  Feels like she may have PNA.  She was given 0.5 mg Atrovent, 125 mg Solu-Medrol, 2 g magnesium, 10 mg albuterol with EMS.  Home Medications Prior to Admission medications   Medication Sig Start Date End Date Taking? Authorizing Provider  amLODipine (NORVASC) 5 MG tablet Take 1 tablet (5 mg total) by mouth at bedtime. 10/25/22   Shelby Mattocks, DO  aspirin EC 81 MG tablet Take 1 tablet (81 mg total) by mouth daily. 10/24/22   Shelby Mattocks, DO  atorvastatin (LIPITOR) 40 MG tablet Take 1 tablet (40 mg total) by mouth daily. 10/25/22   Shelby Mattocks, DO  carvedilol (COREG) 25 MG tablet Take 1.5 tablets (37.5 mg total) by mouth 2 (two) times daily with a meal. 10/23/22   Shelby Mattocks, DO  losartan-hydrochlorothiazide (HYZAAR) 100-25 MG tablet Take 1 tablet by mouth daily. 10/24/22   Shelby Mattocks, DO  potassium chloride SA (KLOR-CON M) 20 MEQ tablet Take 1 tablet (20 mEq total) by mouth 2 (two) times daily. 08/27/22   Shelby Mattocks, DO      Allergies    Lisinopril    Review of Systems   Review of Systems  Respiratory:  Positive for shortness of breath and wheezing.   All other systems reviewed and  are negative.   Physical Exam Updated Vital Signs BP (!) 142/78 (BP Location: Right Arm)   Pulse 91   Temp 98.7 F (37.1 C)   Resp (!) 24   Ht 5\' 7"  (1.702 m)   Wt (!) 138.6 kg   LMP 11/21/2012   SpO2 97%   BMI 47.86 kg/m   Physical Exam Vitals and nursing note reviewed.  Constitutional:      Appearance: She is well-developed. She is obese.  HENT:     Head: Normocephalic and atraumatic.  Eyes:     Conjunctiva/sclera: Conjunctivae normal.     Pupils: Pupils are equal, round, and reactive to light.  Cardiovascular:     Rate and Rhythm: Normal rate and regular rhythm.     Heart sounds: Normal heart sounds.  Pulmonary:     Effort: Pulmonary effort is normal.     Breath sounds: Wheezing present.     Comments: Increased WOB, some retractions, albuterol neb going during initial exam Abdominal:     General: Bowel sounds are normal.     Palpations: Abdomen is soft.  Musculoskeletal:        General: Normal range of motion.     Cervical back: Normal range of motion.  Skin:    General: Skin is warm and dry.  Neurological:     Mental Status: She is alert and oriented to person, place, and time.  ED Results / Procedures / Treatments   Labs (all labs ordered are listed, but only abnormal results are displayed) Labs Reviewed  CBC WITH DIFFERENTIAL/PLATELET - Abnormal; Notable for the following components:      Result Value   MCV 101.8 (*)    All other components within normal limits  RESP PANEL BY RT-PCR (RSV, FLU A&B, COVID)  [id number]  BRAIN NATRIURETIC PEPTIDE  BASIC METABOLIC PANEL  TROPONIN I (HIGH SENSITIVITY)  TROPONIN I (HIGH SENSITIVITY)    EKG None  Radiology DG Chest Port 1 View  Result Date: 11/19/2022 CLINICAL DATA:  Shortness of breath EXAM: PORTABLE CHEST 1 VIEW COMPARISON:  06/06/2021 FINDINGS: Interstitial coarsening is similar to prior. There is no edema, consolidation, effusion, or pneumothorax. Normal heart size and mediastinal contours for  technique. Limited chest due to low volumes and kyphotic positioning. IMPRESSION: No acute finding when compared to prior. Electronically Signed   By: Tiburcio Pea M.D.   On: 11/19/2022 05:20    Procedures Procedures    CRITICAL CARE Performed by: Garlon Hatchet   Total critical care time: 35 minutes  Critical care time was exclusive of separately billable procedures and treating other patients.  Critical care was necessary to treat or prevent imminent or life-threatening deterioration.  Critical care was time spent personally by me on the following activities: development of treatment plan with patient and/or surrogate as well as nursing, discussions with consultants, evaluation of patient's response to treatment, examination of patient, obtaining history from patient or surrogate, ordering and performing treatments and interventions, ordering and review of laboratory studies, ordering and review of radiographic studies, pulse oximetry and re-evaluation of patient's condition.   Medications Ordered in ED Medications - No data to display  ED Course/ Medical Decision Making/ A&P                             Medical Decision Making Amount and/or Complexity of Data Reviewed Labs: ordered. Radiology: ordered and independent interpretation performed. ECG/medicine tests: ordered and independent interpretation performed.  Risk Prescription drug management. Decision regarding hospitalization.   58 year old female presenting to the ED with shortness of breath over the past 2 days.  Feels like she may have pneumonia.  She reports cough but no fever.  Shortness of breath is worse with exertion.  She is afebrile and nontoxic.  Does have some wheezing on exam, currently completing albuterol neb from EMS.  She denies any chest pain but reports some "tightness".  Will check EKG, labs, chest x-ray.  5:43 AM Patient reassessed-- sats 80% on RA.  She has minimal wheezing currently but overall  states she feels improved from prior.   She now reports she has not had her CPAP for the past several days as machine is broken and she is waiting for a new one.  She was placed on 2L supplemental O2.  CXR without acute findings.  Labs as above-- no leukocytosis.  RVP is negative.  BNP within normal limits. Trop and chemistry still pending.  5:57 AM Called back to room as patient attempting to get up to bathroom and had return of SOB.  She is down to 88% on 2L, bumped up to 4L.  She does have recurrent wheezing, ordering additional duoneb.  She will need admission.  6:34 AM Chemistry and troponin clotted, required recollect and are pending.  Care signed out to oncoming provider.  Will admit once labs resulted.  Final Clinical Impression(s) /  ED Diagnoses Final diagnoses:  Acute respiratory failure with hypoxia [facility])    Rx / DC Orders ED Discharge Orders     None         Garlon Hatchet, PA-C 11/19/22 4098    Shon Baton, MD 11/21/22 [phone]

## 2022-11-19 NOTE — H&P (Signed)
History and Physical    Patient: Virginia Levine ZOX:[id number] DOB: 11-15-1965 DOA: 11/19/2022 DOS: the patient was seen and examined on 11/19/2022 PCP: Shelby Mattocks, DO  Patient coming from: Home  Chief Complaint:  Chief Complaint  Patient presents with   Shortness of Breath   HPI: Virginia Levine is a 57 y.o. female with medical history significant of ascending aorta dilatation on echocardiogram, cardiomegaly, coronary artery disease, hyperlipidemia, hypertension, tobacco abuse, class III obesity, hepatic asteatosis, obstructive sleep apnea, type 2 diabetes who is coming to the emergency department with complaints of dyspnea associated with occasionally productive cough of whitish/yellowish sputum, wheezing and fatigue for the past 2 days.  She has noticed that she is having a lot of difficulty with exertion.  She smokes about 5 to 6 cigarettes/day.  When EMS arrived they found her with an O2 saturation in the 80s. He denied fever, chills, rhinorrhea, sore throat or hemoptysis.  No chest pain, palpitations, diaphoresis, PND, orthopnea or pitting edema of the lower extremities.  No abdominal pain, nausea, emesis, diarrhea, constipation, melena or hematochezia.  No flank pain, dysuria, frequency or hematuria.  No polyuria, polydipsia, polyphagia or blurred vision.   Lab work: CBC showed a white count of 7.9, hemoglobin 14.3 g/dL with an MCV of 811.9 fL and platelets 156.  Troponin x 2 normal.  BMP 73.3 pg/mL.  Coronavirus, influenza A/B and RSV PCR was negative.  BMP showed a sodium 143, potassium 2.3, chloride 118 and CO2 of 20 mmol/L with an anion gap of 3.  Calcium 5.6, magnesium 1.5, phosphorus 2.5 mg/dL, glucose 147, BUN 8 creatinine less than 0.30 mg/dL LFTs were normal.  ED course: Initial vital signs were temperature 98.7 F, pulse 91, respirations 24, BP 142/78 mmHg O2 sat 97% on room air.  The patient received a DuoNeb, KCl 40 mill colons p.o. x 1, KCl 10 mEq IVPB x 4.  Imaging: Portable  1 view chest radiograph with no acute finding when compared to prior.   Review of Systems: As mentioned in the history of present illness. All other systems reviewed and are negative.  Past Medical History:  Diagnosis Date   Ascending aorta dilation (HCC) 10/27/2021   Echocardiogram 10/2021: Ascending aorta 39 mm   Cardiomegaly 07/10/2021   Echocardiogram 10/2021: EF 60-65, no RWMA, mod LVH, Gr 1 DD, normal RVSF, normal PASP, severe LAE, mild MR, ascending aorta 39 mm   Coronary artery disease 07/24/2021   CCTA 2/23: CAC score 601 (99th percentile); pLAD 25-49, mLAD 1-24, pD1 25-49, pLCx 25-49, pOM1 1-24; FFR negative; Asc Aorta 3.6 cm; hepatic steatosis    Hyperlipidemia    Hypertension    Obesity    OSA (obstructive sleep apnea)    Pre-diabetes    Tobacco use    Past Surgical History:  Procedure Laterality Date   LEEP  2018   Social History:  reports that she has been smoking cigarettes. She has been smoking an average of .15 packs per day. She has been exposed to tobacco smoke. She has never used smokeless tobacco. She reports current alcohol use. She reports that she does not use drugs.  Allergies  Allergen Reactions   Lisinopril Other (See Comments)    Angioedema / Hair loss/burned scalp    Family History  Problem Relation Age of Onset   Heart attack Father        died at age 57   Leukemia Mother 48   Diabetes Mother    Drug abuse Sister  Aneurysm Sister    Kidney failure Brother    Hypertension Brother    Aneurysm Sister    Diabetes Maternal Grandmother     Prior to Admission medications   Medication Sig Start Date End Date Taking? Authorizing Provider  amLODipine (NORVASC) 5 MG tablet Take 1 tablet (5 mg total) by mouth at bedtime. 10/25/22   Shelby Mattocks, DO  aspirin EC 81 MG tablet Take 1 tablet (81 mg total) by mouth daily. 10/24/22   Shelby Mattocks, DO  atorvastatin (LIPITOR) 40 MG tablet Take 1 tablet (40 mg total) by mouth daily. 10/25/22   Shelby Mattocks, DO   carvedilol (COREG) 25 MG tablet Take 1.5 tablets (37.5 mg total) by mouth 2 (two) times daily with a meal. 10/23/22   Shelby Mattocks, DO  losartan-hydrochlorothiazide (HYZAAR) 100-25 MG tablet Take 1 tablet by mouth daily. 10/24/22   Shelby Mattocks, DO  potassium chloride SA (KLOR-CON M) 20 MEQ tablet Take 1 tablet (20 mEq total) by mouth 2 (two) times daily. 08/27/22   Shelby Mattocks, DO    Physical Exam: Vitals:   11/19/22 0442 11/19/22 0445 11/19/22 0700  BP:  (!) 142/78   Pulse:  91 75  Resp:  (!) 24   Temp:  98.7 F (37.1 C)   SpO2:  97% 92%  Weight: (!) 138.6 kg    Height: 5\' 7"  (1.702 m)     Physical Exam Vitals and nursing note reviewed.  Constitutional:      General: She is awake. She is not in acute distress.    Appearance: She is well-developed.  HENT:     Nose: No rhinorrhea.     Mouth/Throat:     Mouth: Mucous membranes are moist.  Eyes:     General: No scleral icterus.    Pupils: Pupils are equal, round, and reactive to light.  Neck:     Vascular: No JVD.  Cardiovascular:     Rate and Rhythm: Normal rate and regular rhythm.     Heart sounds: S1 normal and S2 normal.  Pulmonary:     Breath sounds: Examination of the right-lower field reveals decreased breath sounds. Examination of the left-lower field reveals decreased breath sounds. Decreased breath sounds, wheezing and rhonchi present. No rales.  Abdominal:     General: Abdomen is protuberant. Bowel sounds are normal. There is no distension.     Palpations: Abdomen is soft.     Tenderness: There is no abdominal tenderness. There is no guarding.  Musculoskeletal:     Cervical back: Neck supple.     Right lower leg: No edema.     Left lower leg: No edema.  Skin:    General: Skin is warm and dry.  Neurological:     General: No focal deficit present.     Mental Status: She is alert and oriented to person, place, and time.  Psychiatric:        Mood and Affect: Mood normal.        Behavior: Behavior normal.  Behavior is cooperative.     Data Reviewed:  Results are pending, will review when available.  10/26/2021 transthoracic echocardiogram. IMPRESSIONS:   1. Left ventricular ejection fraction, by estimation, is 60 to 65%. The  left ventricle has normal function. The left ventricle has no regional  wall motion abnormalities. There is moderate concentric left ventricular  hypertrophy. Left ventricular  diastolic parameters are consistent with Grade I diastolic dysfunction  (impaired relaxation).   2. Right ventricular systolic function is  normal. The right ventricular  size is normal. There is normal pulmonary artery systolic pressure.   3. Left atrial size was severely dilated.   4. The mitral valve is normal in structure. Mild mitral valve  regurgitation. No evidence of mitral stenosis.   5. The aortic valve is tricuspid. Aortic valve regurgitation is not  visualized. No aortic stenosis is present.   6. Aortic dilatation noted. There is mild dilatation of the ascending  aorta, measuring 39 mm.   7. The inferior vena cava is normal in size with greater than 50%  respiratory variability, suggesting right atrial pressure of 3 mmHg.   EKG: Vent. rate 91 BPM PR interval 157 ms QRS duration 88 ms QT/QTcB 363/447 ms P-R-T axes 71 60 71 Sinus rhythm Borderline low voltage, extremity leads  Assessment and Plan: Principal Problem:   Acute respiratory failure with hypoxia (HCC) Secondary to reactive airways. Observation/telemetry Continue supplemental oxygen. Methylprednisolone 125 mg IVP x1 given. Followed by prednisone 40 mg p.o. daily in a.m. Scheduled and as needed bronchodilators. Follow-up CBC and chemistry in the morning.  As needed BiPAP ventilation.  Active Problems:   Hypokalemia Replacing. Continue regular potassium supplementation.    Hypocalcemia Secondary to vitamin D deficiency. Will start vitamin D replacement. Follow  up with primary care provider.     Hypomagnesemia Secondary to diuretic use. Magnesium sulfate 2 g IVPB given earlier. Might benefit from regular magnesium supplementation.    Essential hypertension Continue amlodipine 5 mg p.o. daily. Continue carvedilol 37.5 mg p.o. twice daily. Continue losartan 100 mg p.o. daily. Continue HCTZ 25 mg p.o. daily. Monitor BP, HR, renal function electrolytes.    Tobacco abuse Tobacco cessation advised. May use nicotine replacement therapy as needed.    OSA (obstructive sleep apnea) BiPAP at bedtime.    Type 2 diabetes mellitus without complications (HCC) Carbohydrate modified diet. Currently receiving glucocorticoids. CBG monitoring with [facility] while in the hospital. Check hemoglobin A1c level in AM.    Gastroesophageal reflux disease Antiacid, H2 blocker or PPI as needed.    Hyperlipidemia Continue atorvastatin 40 mg p.o. daily.    Hepatic steatosis   Class 3 obesity (HCC) Current BMI 48.27 kg/m. Lifestyle modifications. Monitor LFTs. Follow-up with primary care provider.      Advance Care Planning:   Code Status: Full Code   Consults:   Family Communication:   Severity of Illness: The appropriate patient status for this patient is OBSERVATION. Observation status is judged to be reasonable and necessary in order to provide the required intensity of service to ensure the patient's safety. The patient's presenting symptoms, physical exam findings, and initial radiographic and laboratory data in the context of their medical condition is felt to place them at decreased risk for further clinical deterioration. Furthermore, it is anticipated that the patient will be medically stable for discharge from the hospital within 2 midnights of admission.   Author: Bobette Mo, MD 11/19/2022 8:18 AM  For on call review [email or web address].   This document was prepared using Dragon voice recognition software and may contain some unintended transcription errors.

## 2022-11-20 DIAGNOSIS — I10 Essential (primary) hypertension: Secondary | ICD-10-CM | POA: Diagnosis not present

## 2022-11-20 DIAGNOSIS — E876 Hypokalemia: Secondary | ICD-10-CM

## 2022-11-20 DIAGNOSIS — G4733 Obstructive sleep apnea (adult) (pediatric): Secondary | ICD-10-CM

## 2022-11-20 LAB — COMPREHENSIVE METABOLIC PANEL
ALT: 24 U/L (ref 0–44)
AST: 18 U/L (ref 15–41)
Albumin: 3.9 g/dL (ref 3.5–5.0)
Alkaline Phosphatase: 67 U/L (ref 38–126)
Anion gap: 8 (ref 5–15)
BUN: 15 mg/dL (ref 6–20)
CO2: 28 mmol/L (ref 22–32)
Calcium: 9.2 mg/dL (ref 8.9–10.3)
Chloride: 102 mmol/L (ref 98–111)
Creatinine, Ser: 0.55 mg/dL (ref 0.44–1.00)
GFR, Estimated: >60 mL/min (ref >60)
Glucose, Bld: 186 mg/dL — ABNORMAL HIGH (ref 70–99)
Potassium: 4.3 mmol/L (ref 3.5–5.1)
Sodium: 138 mmol/L (ref 135–145)
Total Bilirubin: 0.7 mg/dL (ref 0.3–1.2)
Total Protein: 7.4 g/dL (ref 6.5–8.1)

## 2022-11-20 LAB — GLUCOSE, CAPILLARY
Glucose-Capillary: 167 mg/dL — ABNORMAL HIGH (ref 70–99)
Glucose-Capillary: 194 mg/dL — ABNORMAL HIGH (ref 70–99)
Glucose-Capillary: 247 mg/dL — ABNORMAL HIGH (ref 70–99)
Glucose-Capillary: 203 mg/dL — ABNORMAL HIGH (ref 70–99)

## 2022-11-20 LAB — HEMOGLOBIN A1C
Hgb A1c MFr Bld: 6.1 % — ABNORMAL HIGH (ref 4.8–5.6)
Mean Plasma Glucose: 128.37 mg/dL

## 2022-11-20 LAB — CBC
HCT: 42.8 % (ref 36.0–46.0)
Hemoglobin: 13.7 g/dL (ref 12.0–15.0)
MCH: 33.1 pg (ref 26.0–34.0)
MCHC: 32 g/dL (ref 30.0–36.0)
MCV: 103.4 fL — ABNORMAL HIGH (ref 80.0–100.0)
Platelets: 160 10*3/uL (ref 150–400)
RBC: 4.14 MIL/uL (ref 3.87–5.11)
RDW: 13.2 % (ref 11.5–15.5)
WBC: 9.8 10*3/uL (ref 4.0–10.5)
nRBC: 0 % (ref 0.0–0.2)

## 2022-11-20 LAB — HIV ANTIBODY (ROUTINE TESTING W REFLEX): HIV Screen 4th Generation wRfx: NONREACTIVE

## 2022-11-20 MED ORDER — IPRATROPIUM-ALBUTEROL 0.5-2.5 (3) MG/3ML IN SOLN: 3.0000 mL | Freq: Four times a day (QID) | RESPIRATORY_TRACT | Status: DC

## 2022-11-20 MED ORDER — IPRATROPIUM-ALBUTEROL 0.5-2.5 (3) MG/3ML IN SOLN
3.0000 mL | Freq: Four times a day (QID) | RESPIRATORY_TRACT | Status: DC
  Administered 2022-11-20 – 2022-11-22 (×7): 3 mL via RESPIRATORY_TRACT
  Filled 2022-11-20 (×8): qty 3

## 2022-11-20 MED ORDER — IPRATROPIUM-ALBUTEROL 0.5-2.5 (3) MG/3ML IN SOLN: 3.0000 mL | Freq: Two times a day (BID) | RESPIRATORY_TRACT | Status: DC

## 2022-11-20 MED ORDER — GUAIFENESIN-DM 100-10 MG/5ML PO SYRP
5.0000 mL | ORAL_SOLUTION | ORAL | Status: DC | PRN
  Filled 2022-11-20: qty 10

## 2022-11-20 MED ORDER — METHYLPREDNISOLONE SODIUM SUCC 40 MG IJ SOLR
40.0000 mg | Freq: Two times a day (BID) | INTRAMUSCULAR | Status: DC
  Administered 2022-11-20 – 2022-11-22 (×4): 40 mg via INTRAVENOUS
  Filled 2022-11-20 (×4): qty 1

## 2022-11-20 NOTE — Progress Notes (Signed)
Initial Nutrition Assessment  DOCUMENTATION CODES:   Morbid obesity  INTERVENTION:  - Heart Healthy/Carb Modified diet.   Nutrition status stable at this time, will sign off. Please re-consult if needed.   NUTRITION DIAGNOSIS:   Increased nutrient needs related to acute illness as evidenced by estimated needs.  GOAL:   Patient will meet greater than or equal to 90% of their needs  MONITOR:   PO intake, Weight trends  REASON FOR ASSESSMENT:   Consult Assessment of nutrition requirement/status  ASSESSMENT:   57 y.o. female with PMH of cardiomegaly, CAD, HLD, HTN, class III obesity, hepatic asteatosis, OSA, type 2 diabetes who presented with complaints of dyspnea with occasionally productive cough, wheezing and fatigue. Admitted for acute respiratory failure.   Patient endorses a UBW of around 300#. Denies any recent changes in weight.   Reports eating 3 meals a day at home. Has been working to eat healthier and has cut out most junk food and is cooking a lot with the air fryer. Usually has a muffin or oatmeal for breakfast, a keto tortilla wrap with [place], lettuce, tomato, onion for lunch, and a [place] burger or fish for dinner with vegetables. Notes her weakness is french fries as she loves them but otherwise tries to eat healthy and always cooks everything in the air fryer.   Current appetite remains normal and patient consuming 100% of meals.    Medications reviewed and include: -  Labs reviewed:  HA1C 6.1   NUTRITION - FOCUSED PHYSICAL EXAM:  No depletions  Diet Order:   Diet Order             Diet heart healthy/carb modified Room service appropriate? Yes; Fluid consistency: Thin  Diet effective now                   EDUCATION NEEDS:  Education needs have been addressed  Skin:  Skin Assessment: Reviewed RN Assessment  Last BM:  6/18  Height:  Ht Readings from Last 1 Encounters:  11/19/22 5\' 7"  (1.702 m)   Weight:  Wt Readings from Last 1  Encounters:  11/19/22 (!) 139.8 kg   Ideal Body Weight:  61.36 kg  BMI:  Body mass index is 48.27 kg/m.  Estimated Nutritional Needs:  Kcal:  2000-2200 kcals Protein:  75-90 grams Fluid:  >/= 2L    Shelle Iron RD, LDN For contact information, refer to [facility].

## 2022-11-20 NOTE — Progress Notes (Addendum)
SATURATION QUALIFICATIONS: (This note is used to comply with regulatory documentation for home oxygen)  Patient Saturations on Room Air at Rest = 94%  Patient Saturations on Room Air while Ambulating = 82%  Patient Saturations on 6 Liters of oxygen while Ambulating = 92%  Please briefly explain why patient needs home oxygen: Pt meets the requirement for home oxygen. Unable to maintain oxygen saturation above 92%.

## 2022-11-20 NOTE — Progress Notes (Signed)
Mobility Specialist - Progress Note  Pre-mobility: 80 bpm HR, 94% SpO2 During mobility: 100 bpm HR, 85% SpO2 Post-mobility: 88 bpm HR, 92% SPO2   11/20/22 0905  Mobility  Activity Ambulated independently in hallway  Level of Assistance Standby assist, set-up cues, supervision of patient - no hands on  Assistive Device None  Distance Ambulated (ft) 350 ft  Range of Motion/Exercises Active  Activity Response Tolerated fair  Mobility Referral Yes  $Mobility charge 1 Mobility  Mobility Specialist Start Time (ACUTE ONLY) [id number]  Mobility Specialist Stop Time (ACUTE ONLY) 0905  Mobility Specialist Time Calculation (min) (ACUTE ONLY) 27 min   Pt was found sitting EOB and agreeable to ambulate. Got a little winded during ambulation SPO2 checked to be 85% and encouraged pursed lip breathing. After ~24min she was able to bring SPO2 to 90%. Afterwards pt slowed her pace when beginning ambulation again. Pt destated to 88% SPO2 last half of ambulation. Upon returning to room able to bring SPO2 >91% in less than a minute. At EOS was left sitting EOB with all needs met and daughter in room.  Billey Chang Mobility Specialist

## 2022-11-20 NOTE — TOC Initial Note (Addendum)
Transition of Care [facility]) - Initial/Assessment Note    Patient Details  Name: Virginia Levine MRN: [id number] Date of Birth: August 10, 1965  [facility] CM/SW Contact:    Howell Rucks, RN Phone Number: 11/20/2022, 10:50 AM  Clinical Narrative:  Met with pt at bedside to introduce role of TOC/NCM and review for dc needs. TOC consult received for assistance in obtaining a new CPAP. Per pt she initially obtained her CPAP from [facility], [facility] to make a referral to [employer] for repair/obtain a new CPAP, pt reports that has been weeks ago. NCM will contact [employer] for status of CPAP. Pt reports she lives alone with support from ehr niece, pt reports she is independent with no home DME, no home care services, confirms has transport at discharge.   -10:54am NCM outreached to [employer] rep-Mitch ( [phone]), reports he will investigate and call me back. TOC will continue to follow.   111:02am Call received from [facility] with [employer], reports pt received CPAP in 2021, not eligible for new one per insurance benefits,  per Mitch, pt to call  [facility] at discharge, phone [phone], to schedule maintenance, if unable to repair onsite, will provide pt a loaner while sent off for repair. Pt notified,  added to AVS. TOC will continue to follow.   - 12:00pm Met with pt at bedside to give information for CPAP repair , pt confirmed she threw her CPAP away.   Call to [facility] with [employer] to notify pt threw her CPAP away, Mitch confirmed pt not eligible for new CPAP until August 2026, Mitch reports [employer] offers a  CPAP Scientist, physiological for $450.00, pt would need to come into the [facility] office to obtain. NCM will notify pt.   -1:32pm Met with pt at bedside to update on option to purchase a CPAP through [employer],  pt now reports she has the CPAP in her home. Pt reports the CPAP she has now was not purchased through her insurance. NCM  outreached to Mitch at [employer], confirmed  CPAP was purchased August 2021 through a company that [employer] bought out.  Per Marthann Schiller, pt can bring her CPAP to the [facility] office for repair and to be fitted, if CPAP has to be sent out, pt will be provided a loaner.   -3:10pm Met with pt at bedside to inquire about most recent sleep study, pt reports she had a sleep study about 1 month ago at [facility] . Call to [facility] with [facility], confirmed recent sleep study,  settings available, recommendation for 02 at 3L/min with CPAP. [employer] to provide CPAP and 02,  NCM requested 02 sats per request of [employer]. Mitch to email order for CPAP/02, order will be placed on pt's shadow chart for MD signature.  02 to be delivered to pt's bedside, CPAP to be delivered to pt for home set up  -3:31pm CPAP/02 order form received from [employer], placed on shadow chart for MD signature, await 02 sats.                     Expected Discharge Plan: Home/Self Care Barriers to Discharge: Continued Medical Work up   Patient Goals and CMS Choice            Expected Discharge Plan and Services   Discharge Planning Services: CM Consult   Living arrangements for the past 2 months: Apartment  Prior Living Arrangements/Services Living arrangements for the past 2 months: Apartment Lives with:: Self Patient language and need for interpreter reviewed:: Yes Do you feel safe going back to the place where you live?: Yes      Need for Family Participation in Patient Care: Yes (Comment) Care giver support system in place?: Yes (comment) Current home services: DME (CPAP) Criminal Activity/Legal Involvement Pertinent to Current Situation/Hospitalization: No - Comment as needed  Activities of Daily Living Home Assistive Devices/Equipment: Eyeglasses ADL Screening (condition at time of admission) Patient's cognitive ability adequate to safely complete daily activities?: Yes Is the patient deaf or have difficulty  hearing?: No Does the patient have difficulty seeing, even when wearing glasses/contacts?: No Does the patient have difficulty concentrating, remembering, or making decisions?: No Patient able to express need for assistance with ADLs?: Yes Does the patient have difficulty dressing or bathing?: No Independently performs ADLs?: Yes (appropriate for developmental age) Does the patient have difficulty walking or climbing stairs?: No Weakness of Legs: Both Weakness of Arms/Hands: Both  Permission Sought/Granted Permission sought to share information with : Case Manager Permission granted to share information with : Yes, Verbal Permission Granted  Share Information with NAME: Fannie Knee, RN           Emotional Assessment Appearance:: Appears stated age Attitude/Demeanor/Rapport: Gracious Affect (typically observed): Accepting Orientation: : Oriented to Self, Oriented to Place, Oriented to  Time, Oriented to Situation Alcohol / Substance Use: Not Applicable Psych Involvement: No (comment)  Admission diagnosis:  Acute respiratory failure with hypoxia (HCC) [J96.01] Patient Active Problem List   Diagnosis Date Noted   Acute respiratory failure with hypoxia (HCC) 11/19/2022   Hypocalcemia 11/19/2022   Hypomagnesemia 11/19/2022   Class 3 obesity (HCC) 11/19/2022   Vitamin D deficiency 11/19/2022   Screening for cervical cancer 04/19/2022   Pelvic pain 04/19/2022   Back pain 02/19/2022   Ascending aorta dilation (HCC) 10/27/2021   Depression 08/30/2021   Fatigue 08/30/2021   Hepatic steatosis 08/01/2021   Coronary artery disease involving native coronary artery of native heart without angina pectoris 07/24/2021   Cardiomegaly 07/10/2021   Dyspnea on exertion 07/05/2021   Routine adult health maintenance 07/04/2021   Insomnia 04/13/2020   Flat foot 04/13/2020   Hypokalemia 03/23/2020   Type 2 diabetes mellitus without complications (HCC) 02/14/2020   OSA (obstructive sleep  apnea) 12/15/2019   Screen for colon cancer 12/15/2019   Elevated liver enzymes 07/06/2018   Gastroesophageal reflux disease 03/16/2018   History of abnormal cervical Pap smear 11/26/2016   Other specified abnormal uterine and vaginal bleeding 03/24/2015   Rectal bleed 03/24/2015   Hyperlipidemia 01/11/2015   Obesity 01/21/2013   Tobacco abuse 01/21/2013   Essential hypertension 12/11/2012   PCP:  Shelby Mattocks, DO Pharmacy:   [facility] [address] [place] [address] Phone: [phone] Fax: [phone]  [facility] [address] [place] [address] Phone: [phone] Fax: [phone]     Social Determinants of Health (SDOH) Social History: SDOH Screenings   Food Insecurity: No Food Insecurity (11/19/2022)  Housing: Low Risk  (11/19/2022)  Transportation Needs: No Transportation Needs (11/19/2022)  Utilities: Not At Risk (11/19/2022)  Depression (PHQ2-9): Medium Risk (10/22/2022)  Tobacco Use: High Risk (11/19/2022)   SDOH Interventions:     Readmission Risk Interventions    11/20/2022   10:49 AM  Readmission Risk Prevention Plan  Post Dischage Appt Complete  Medication Screening Complete  Transportation Screening Complete

## 2022-11-20 NOTE — Progress Notes (Signed)
[facility]  PROGRESS NOTE  Virginia Levine VHQ:[id number] DOB: [birth year] DOA: 11/19/2022 PCP: Shelby Mattocks, DO   Brief HPI:   57 year old female with history of ascending aortic dilatation on echocardiogram, cardiomegaly, CAD, hyperlipidemia, hypertension, tobacco abuse, class III obesity, hepatic steatosis, OSA, diabetes mellitus type 2 came to ED with complaints of dyspnea associated with occasional productive cough, wheezing, fatigue for past 2 days.  She also noted that she was having difficulty in breathing with exertion.  Patient smokes about 5 to 6 cigarettes/day.  When EMS arrived patient's O2 sats was in 80s.  In the ED SARS-CoV-2 RT-PCR, RSV PCR, influenza/AB were negative.  Chest x-ray showed no acute finding.    Assessment/Plan:   Acute hypoxemic respiratory failure -Secondary to reactive airway disease -Will discontinue prednisone and continue with Solu-Medrol 40 mg IV every 12 hours -Continue DuoNeb nebulizers every 6 hours -Continue BiPAP as needed  Obstructive sleep apnea -Patient CPAP machine broke at home, has not been using CPAP -Case manager has confirmed with [employer], they will give her a loaner CPAP machine and repair the old machine -She will need sleep study as outpatient to get a new CPAP machine -PCP will arrange for sleep study as outpatient  Hypokalemia -Replete  Hypocalcemia -Secondary to vitamin D deficiency -Vitamin D replacement has been started  Hypomagnesemia -Replaced, check serum magnesium level in a.m.  Hypertension -Blood pressure is stable -Continue amlodipine, carvedilol, HCTZ, losartan  Hyperglycemia -Secondary to steroids -Hemoglobin A1c is 6.1 -Continue sliding scale insulin while patient is on steroids  Hyperlipidemia -Continue atorvastatin  Class III obesity -BMI 48.27 kg/m -Lifestyle modification -Follow-up PCP as outpatient   Medications     amLODipine  5 mg Oral QHS   aspirin EC  81 mg  Oral Daily   atorvastatin  40 mg Oral Daily   carvedilol  37.5 mg Oral BID WC   enoxaparin (LOVENOX) injection  70 mg Subcutaneous Q24H   losartan  100 mg Oral Daily   And   hydrochlorothiazide  25 mg Oral Daily   insulin aspart  0-20 Units Subcutaneous TID WC   ipratropium-albuterol  3 mL Nebulization BID   potassium chloride SA  20 mEq Oral BID   predniSONE  40 mg Oral Q breakfast     Data Reviewed:   CBG:  Recent Labs  Lab 11/19/22 2151 11/20/22 0733 11/20/22 1124  GLUCAP 225* 167* 194*    SpO2: 97 % O2 Flow Rate (L/min): 4 L/min    Vitals:   11/20/22 0158 11/20/22 0551 11/20/22 0806 11/20/22 1126  BP: (!) 167/99 (!) 153/98 (!) 157/94 130/72  Pulse: 76 77 77 78  Resp: (!) 22 20  18   Temp: 98.4 F (36.9 C) 98.7 F (37.1 C)  98.2 F (36.8 C)  TempSrc: Oral Oral  Oral  SpO2: 95% 100%  97%  Weight:      Height:          Data Reviewed:  Basic Metabolic Panel: Recent Labs  Lab 11/19/22 0600 11/19/22 1535 11/20/22 0421  NA 143 139 138  K 2.3* 4.3 4.3  CL 118* 104 102  CO2 22 26 28   GLUCOSE 132* 188* 186*  BUN 8 11 15   CREATININE <0.30* 0.66 0.55  CALCIUM 5.6* 9.2 9.2  MG 1.5*  --   --   PHOS 2.5  --   --     CBC: Recent Labs  Lab 11/19/22 0450 11/20/22 0421  WBC 7.9 9.8  NEUTROABS 6.1  --  HGB 14.3 13.7  HCT 44.1 42.8  MCV 101.8* 103.4*  PLT 156 160    LFT Recent Labs  Lab 11/19/22 0739 11/20/22 0421  AST 21 18  ALT 24 24  ALKPHOS 70 67  BILITOT 0.9 0.7  PROT 7.0 7.4  ALBUMIN 3.9 3.9     Antibiotics: Anti-infectives (From admission, onward)    None        DVT prophylaxis: Lovenox  Code Status: Full code  Family Communication:    CONSULTS    Subjective   Patient seen and examined, still complains of dyspnea on exertion.   Objective    Physical Examination:   General-appears in no acute distress Heart-S1-S2, regular, no murmur auscultated Lungs-decreased breath sounds bilaterally at lung  bases Abdomen-soft, nontender, no organomegaly Extremities-no edema in the lower extremities Neuro-alert, oriented x3, no focal deficit noted   Status is: Inpatient:             Meredeth Ide   [facility] If 7PM-7AM, please contact night-coverage at [email or web address], Office  [phone]   11/20/2022, 2:24 PM  LOS: 1 day

## 2022-11-20 NOTE — Progress Notes (Signed)
   11/20/22 2327  BiPAP/CPAP/SIPAP  BiPAP/CPAP/SIPAP Pt Type Adult  BiPAP/CPAP/SIPAP DREAMSTATIOND  Mask Type Nasal mask  Mask Size Medium  Respiratory Rate 20 breaths/min  Flow Rate 3 lpm  Patient Home Equipment No  Auto Titrate Yes (5-20cm h2o)  CPAP/SIPAP surface wiped down Yes  BiPAP/CPAP /SiPAP Vitals  Pulse Rate 77  Resp 20  SpO2 96 %

## 2022-11-20 NOTE — TOC Initial Note (Signed)
Transition of Care [facility]) - Initial/Assessment Note    Patient Details  Name: Virginia Levine MRN: [id number] Date of Birth: 1965/11/11  [facility] CM/SW Contact:    Howell Rucks, RN Phone Number: 11/20/2022, 8:49 AM  Clinical Narrative:  TOC consult received, pt's CPAP is broken, needs a new one. NCM called to pt's PCP ([facility]), left vm for referral coordinator requesting call back to confirm if CPAP ordered through clinic, await call back.                        Patient Goals and CMS Choice            Expected Discharge Plan and Services                                              Prior Living Arrangements/Services                       Activities of Daily Living Home Assistive Devices/Equipment: Eyeglasses ADL Screening (condition at time of admission) Patient's cognitive ability adequate to safely complete daily activities?: Yes Is the patient deaf or have difficulty hearing?: No Does the patient have difficulty seeing, even when wearing glasses/contacts?: No Does the patient have difficulty concentrating, remembering, or making decisions?: No Patient able to express need for assistance with ADLs?: Yes Does the patient have difficulty dressing or bathing?: No Independently performs ADLs?: Yes (appropriate for developmental age) Does the patient have difficulty walking or climbing stairs?: No Weakness of Legs: Both Weakness of Arms/Hands: Both  Permission Sought/Granted                  Emotional Assessment              Admission diagnosis:  Acute respiratory failure with hypoxia (HCC) [J96.01] Patient Active Problem List   Diagnosis Date Noted   Acute respiratory failure with hypoxia (HCC) 11/19/2022   Hypocalcemia 11/19/2022   Hypomagnesemia 11/19/2022   Class 3 obesity (HCC) 11/19/2022   Vitamin D deficiency 11/19/2022   Screening for cervical cancer 04/19/2022   Pelvic pain  04/19/2022   Back pain 02/19/2022   Ascending aorta dilation (HCC) 10/27/2021   Depression 08/30/2021   Fatigue 08/30/2021   Hepatic steatosis 08/01/2021   Coronary artery disease involving native coronary artery of native heart without angina pectoris 07/24/2021   Cardiomegaly 07/10/2021   Dyspnea on exertion 07/05/2021   Routine adult health maintenance 07/04/2021   Insomnia 04/13/2020   Flat foot 04/13/2020   Hypokalemia 03/23/2020   Type 2 diabetes mellitus without complications (HCC) 02/14/2020   OSA (obstructive sleep apnea) 12/15/2019   Screen for colon cancer 12/15/2019   Elevated liver enzymes 07/06/2018   Gastroesophageal reflux disease 03/16/2018   History of abnormal cervical Pap smear 11/26/2016   Other specified abnormal uterine and vaginal bleeding 03/24/2015   Rectal bleed 03/24/2015   Hyperlipidemia 01/11/2015   Obesity 01/21/2013   Tobacco abuse 01/21/2013   Essential hypertension 12/11/2012   PCP:  Shelby Mattocks, DO Pharmacy:   [facility] [address] [place] [address] Phone: [phone] Fax: [phone]  [facility] [address] [place] [address] Phone: [phone] Fax: [phone]     Social Determinants of Health (  SDOH) Social History: SDOH Screenings   Food Insecurity: No Food Insecurity (11/19/2022)  Housing: Low Risk  (11/19/2022)  Transportation Needs: No Transportation Needs (11/19/2022)  Utilities: Not At Risk (11/19/2022)  Depression (PHQ2-9): Medium Risk (10/22/2022)  Tobacco Use: High Risk (11/19/2022)   SDOH Interventions:     Readmission Risk Interventions     No data to display

## 2022-11-20 NOTE — Plan of Care (Signed)
  Problem: Clinical Measurements: Goal: Ability to maintain clinical measurements within normal limits will improve Outcome: Progressing Goal: Respiratory complications will improve Outcome: Progressing   Problem: Activity: Goal: Risk for activity intolerance will decrease Outcome: Progressing   Problem: Education: Goal: Knowledge of General Education information will improve Description: Including pain rating scale, medication(s)/side effects and non-pharmacologic comfort measures Outcome: Not Progressing

## 2022-11-21 DIAGNOSIS — I10 Essential (primary) hypertension: Secondary | ICD-10-CM | POA: Diagnosis not present

## 2022-11-21 DIAGNOSIS — K760 Fatty (change of) liver, not elsewhere classified: Secondary | ICD-10-CM

## 2022-11-21 DIAGNOSIS — J9601 Acute respiratory failure with hypoxia: Secondary | ICD-10-CM | POA: Diagnosis not present

## 2022-11-21 DIAGNOSIS — G4733 Obstructive sleep apnea (adult) (pediatric): Secondary | ICD-10-CM | POA: Diagnosis not present

## 2022-11-21 LAB — MAGNESIUM: Magnesium: 2.2 mg/dL (ref 1.7–2.4)

## 2022-11-21 LAB — GLUCOSE, CAPILLARY
Glucose-Capillary: 183 mg/dL — ABNORMAL HIGH (ref 70–99)
Glucose-Capillary: 207 mg/dL — ABNORMAL HIGH (ref 70–99)
Glucose-Capillary: 212 mg/dL — ABNORMAL HIGH (ref 70–99)
Glucose-Capillary: 262 mg/dL — ABNORMAL HIGH (ref 70–99)

## 2022-11-21 MED ORDER — PNEUMOCOCCAL 20-VAL CONJ VACC 0.5 ML IM SUSY
0.5000 mL | PREFILLED_SYRINGE | INTRAMUSCULAR | Status: AC
  Administered 2022-11-22: 0.5 mL via INTRAMUSCULAR
  Filled 2022-11-21 (×2): qty 0.5

## 2022-11-21 MED ORDER — ALUM & MAG HYDROXIDE-SIMETH 200-200-20 MG/5ML PO SUSP
30.0000 mL | ORAL | Status: DC | PRN
  Administered 2022-11-21: 30 mL via ORAL
  Filled 2022-11-21: qty 30

## 2022-11-21 MED ORDER — OLOPATADINE HCL 0.1 % OP SOLN
1.0000 [drp] | Freq: Two times a day (BID) | OPHTHALMIC | Status: DC
  Administered 2022-11-21 – 2022-11-22 (×3): 1 [drp] via OPHTHALMIC
  Filled 2022-11-21: qty 5

## 2022-11-21 NOTE — Progress Notes (Signed)
[facility]  PROGRESS NOTE  Virginia Levine ZOX:[id number] DOB: 10-10-1965 DOA: 11/19/2022 PCP: Shelby Mattocks, DO   Brief HPI:   57 year old female with history of ascending aortic dilatation on echocardiogram, cardiomegaly, CAD, hyperlipidemia, hypertension, tobacco abuse, class III obesity, hepatic steatosis, OSA, diabetes mellitus type 2 came to ED with complaints of dyspnea associated with occasional productive cough, wheezing, fatigue for past 2 days.  She also noted that she was having difficulty in breathing with exertion.  Patient smokes about 5 to 6 cigarettes/day.  When EMS arrived patient's O2 sats was in 80s.  In the ED SARS-CoV-2 RT-PCR, RSV PCR, influenza/AB were negative.  Chest x-ray showed no acute finding.    Assessment/Plan:   Acute hypoxemic respiratory failure -Secondary to reactive airway disease -continue with Solu-Medrol 40 mg IV every 12 hours -Continue DuoNeb nebulizers every 6 hours -Continue BiPAP as needed  Obstructive sleep apnea -Patient CPAP machine broke at home, has not been using CPAP -Case manager has confirmed with patient's insurance company, they will give her a loaner CPAP machine and repair the old machine -Patient's insurance company will provide CPAP machine before patient is discharged -PCP will arrange for sleep study as outpatient  Hypokalemia -Replete  Hypocalcemia -Secondary to vitamin D deficiency -Vitamin D replacement has been started  Hypomagnesemia -Replete  Hypertension -Blood pressure is stable -Continue amlodipine, carvedilol, HCTZ, losartan  Hyperglycemia -Secondary to steroids -Hemoglobin A1c is 6.1 -Continue sliding scale insulin while patient is on steroids  Hyperlipidemia -Continue atorvastatin  Class III obesity -BMI 48.27 kg/m -Lifestyle modification -Follow-up PCP as outpatient   Medications     amLODipine  5 mg Oral QHS   aspirin EC  81 mg Oral Daily   atorvastatin  40 mg Oral Daily    carvedilol  37.5 mg Oral BID WC   enoxaparin (LOVENOX) injection  70 mg Subcutaneous Q24H   losartan  100 mg Oral Daily   And   hydrochlorothiazide  25 mg Oral Daily   insulin aspart  0-20 Units Subcutaneous TID WC   ipratropium-albuterol  3 mL Nebulization Q6H   methylPREDNISolone (SOLU-MEDROL) injection  40 mg Intravenous Q12H   olopatadine  1 drop Both Eyes BID   potassium chloride SA  20 mEq Oral BID     Data Reviewed:   CBG:  Recent Labs  Lab 11/20/22 0733 11/20/22 1124 11/20/22 1653 11/20/22 2321 11/21/22 0737  GLUCAP 167* 194* 247* 203* 183*    SpO2: 94 % O2 Flow Rate (L/min): 3 L/min    Vitals:   11/20/22 2327 11/21/22 0109 11/21/22 0414 11/21/22 0748  BP:   (!) 160/99   Pulse: 77  76   Resp: 20  20   Temp:   98.3 F (36.8 C)   TempSrc:   Oral   SpO2: 96% 96% 92% 94%  Weight:      Height:          Data Reviewed:  Basic Metabolic Panel: Recent Labs  Lab 11/19/22 0600 11/19/22 1535 11/20/22 0421 11/21/22 0412  NA 143 139 138  --   K 2.3* 4.3 4.3  --   CL 118* 104 102  --   CO2 22 26 28   --   GLUCOSE 132* 188* 186*  --   BUN 8 11 15   --   CREATININE <0.30* 0.66 0.55  --   CALCIUM 5.6* 9.2 9.2  --   MG 1.5*  --   --  2.2  PHOS 2.5  --   --   --  CBC: Recent Labs  Lab 11/19/22 0450 11/20/22 0421  WBC 7.9 9.8  NEUTROABS 6.1  --   HGB 14.3 13.7  HCT 44.1 42.8  MCV 101.8* 103.4*  PLT 156 160    LFT Recent Labs  Lab 11/19/22 0739 11/20/22 0421  AST 21 18  ALT 24 24  ALKPHOS 70 67  BILITOT 0.9 0.7  PROT 7.0 7.4  ALBUMIN 3.9 3.9     Antibiotics: Anti-infectives (From admission, onward)    None        DVT prophylaxis: Lovenox  Code Status: Full code  Family Communication:    CONSULTS    Subjective   Patient seen, breathing has improved.   Objective    Physical Examination:   General-appears in no acute distress Heart-S1-S2, regular, no murmur auscultated Lungs-decreased breath sounds  bilaterally Abdomen-soft, nontender, no organomegaly Extremities-no edema in the lower extremities Neuro-alert, oriented x3, no focal deficit noted   Status is: Inpatient:         Jodean Valade S Alyssa Rotondo   [facility] If 7PM-7AM, please contact night-coverage at [email or web address], Office  [phone]   11/21/2022, 11:23 AM  LOS: 2 days

## 2022-11-21 NOTE — Plan of Care (Signed)

## 2022-11-21 NOTE — TOC Progression Note (Addendum)
Transition of Care [facility]) - Progression Note    Patient Details  Name: Virginia Levine MRN: [id number] Date of Birth: 01-Feb-1966  [facility] CM/SW Contact  Howell Rucks, RN Phone Number: 11/21/2022, 9:44 AM  Clinical Narrative:  CPAP/02 order form signed by attending, emailed to [facility] at [employer]. TOC will continue to follow.    -11:35am Call from [facility] w/[employer], requesting MD cosignature for 02 sats on 11/20/22, nurse who entered is off today, unable to addend for MD to cosign. Mitch checking to see if there is verbiage that can be entered by MD that will suffice. Marthann Schiller states he has all documentation needed for  home 02. TOC will continue to follow    Expected Discharge Plan: Home/Self Care Barriers to Discharge: Continued Medical Work up  Expected Discharge Plan and Services   Discharge Planning Services: CM Consult   Living arrangements for the past 2 months: Apartment                                       Social Determinants of Health (SDOH) Interventions SDOH Screenings   Food Insecurity: No Food Insecurity (11/19/2022)  Housing: Low Risk  (11/19/2022)  Transportation Needs: No Transportation Needs (11/19/2022)  Utilities: Not At Risk (11/19/2022)  Depression (PHQ2-9): Medium Risk (10/22/2022)  Tobacco Use: High Risk (11/19/2022)    Readmission Risk Interventions    11/20/2022   10:49 AM  Readmission Risk Prevention Plan  Post Dischage Appt Complete  Medication Screening Complete  Transportation Screening Complete

## 2022-11-21 NOTE — Progress Notes (Signed)
Placed patient on CPAP for the night via auto-mode with oxygen set at 4lpm.  

## 2022-11-22 ENCOUNTER — Other Ambulatory Visit: Payer: Self-pay

## 2022-11-22 ENCOUNTER — Other Ambulatory Visit (HOSPITAL_COMMUNITY): Payer: Self-pay

## 2022-11-22 DIAGNOSIS — E559 Vitamin D deficiency, unspecified: Secondary | ICD-10-CM

## 2022-11-22 LAB — GLUCOSE, CAPILLARY
Glucose-Capillary: 215 mg/dL — ABNORMAL HIGH (ref 70–99)
Glucose-Capillary: 267 mg/dL — ABNORMAL HIGH (ref 70–99)

## 2022-11-22 MED ORDER — PANTOPRAZOLE SODIUM 40 MG PO TBEC
40.0000 mg | DELAYED_RELEASE_TABLET | Freq: Every day | ORAL | 1 refills | Status: DC
  Filled 2022-11-22: qty 30, 30d supply, fill #0

## 2022-11-22 MED ORDER — ALBUTEROL SULFATE HFA 108 (90 BASE) MCG/ACT IN AERS
2.0000 | INHALATION_SPRAY | Freq: Four times a day (QID) | RESPIRATORY_TRACT | 2 refills | Status: DC | PRN
  Filled 2022-11-22: qty 18, 25d supply, fill #0
  Filled 2022-12-13: qty 18, 25d supply, fill #1
  Filled 2022-12-31 – 2023-01-01 (×2): qty 18, 25d supply, fill #2

## 2022-11-22 MED ORDER — ERGOCALCIFEROL 1.25 MG (50000 UT) PO CAPS
50000.0000 [IU] | ORAL_CAPSULE | ORAL | 2 refills | Status: DC
  Filled 2022-11-22: qty 4, 28d supply, fill #0

## 2022-11-22 MED ORDER — POTASSIUM CHLORIDE CRYS ER 20 MEQ PO TBCR
20.0000 meq | EXTENDED_RELEASE_TABLET | Freq: Two times a day (BID) | ORAL | 1 refills | Status: DC
Start: 2022-11-22 — End: 2023-04-05
  Filled 2022-11-22: qty 60, 30d supply, fill #0
  Filled 2023-01-13: qty 60, 30d supply, fill #1

## 2022-11-22 NOTE — Discharge Summary (Addendum)
Physician Discharge Summary   Patient: Virginia Levine MRN: [id number] DOB: 07/17/1965  Admit date:     11/19/2022  Discharge date: 11/22/22  Discharge Physician: Meredeth Ide   PCP: Shelby Mattocks, DO   Recommendations at discharge:   Follow-up PCP in 1 week Check vitamin D level in 3 months  Discharge Diagnoses: Principal Problem:   Acute respiratory failure with hypoxia (HCC) Active Problems:   Essential hypertension   Tobacco abuse   OSA (obstructive sleep apnea)   Type 2 diabetes mellitus without complications (HCC)   Hypokalemia   Gastroesophageal reflux disease   Hyperlipidemia   Hepatic steatosis   Hypocalcemia   Hypomagnesemia   Class 3 obesity (HCC)   Vitamin D deficiency  Resolved Problems:   * No resolved hospital problems. *  Hospital Course:  56 year old female with history of ascending aortic dilatation on echocardiogram, cardiomegaly, CAD, hyperlipidemia, hypertension, tobacco abuse, class III obesity, hepatic steatosis, OSA, diabetes mellitus type 2 came to ED with complaints of dyspnea associated with occasional productive cough, wheezing, fatigue for past 2 days.  She also noted that she was having difficulty in breathing with exertion.  Patient smokes about 5 to 6 cigarettes/day.  When EMS arrived patient's O2 sats was in 80s.  In the ED SARS-CoV-2 RT-PCR, RSV PCR, influenza/AB were negative.  Chest x-ray showed no acute finding.    Assessment and Plan:  Acute hypoxemic respiratory failure -Secondary to reactive airway disease -Improved after starting Solu-Medrol 40 mg IV every 12 hours -At this time patient breathing is back to baseline, -Currently requiring 3 L pulmonary of oxygen Will discharge on oxygen; as needed albuterol -Will not give steroids, as patient has improved and also has hyperglycemia from steroids  Obstructive sleep apnea -Patient CPAP machine broke at home, has not been using CPAP -Case manager -Patient's insurance company will  provided CPAP in the hospital  -PCP will arrange for sleep study as outpatient   Hypokalemia -Replete   Hypocalcemia/vitamin D deficiency -Vitamin D level was low at 6.47 -Secondary to vitamin D deficiency -Vitamin D replacement has been started -Will discharge on vitamin D 50,000 units weekly for 3 months   Hypomagnesemia -Replete   Hypertension -Blood pressure is stable -Continue amlodipine, carvedilol, HCTZ, losartan   Hyperglycemia -Secondary to steroids -Hemoglobin A1c is 6.1 -She was started on sliding scale insulin while patient is on steroids -Steroids have been discontinued, will not discharge on insulin  GERD -Patient still complains of cough, however lungs are clear -Will discharge on Protonix 40 mg p.o. daily   Hyperlipidemia -Continue atorvastatin   Class III obesity -BMI 48.27 kg/m -Lifestyle modification -Follow-up PCP as outpatient          Consultants:  Procedures performed:  Disposition: Home Diet recommendation:  Discharge Diet Orders (From admission, onward)     Start     Ordered   11/22/22 0000  Diet - low sodium heart healthy        11/22/22 1057           Regular diet DISCHARGE MEDICATION: Allergies as of 11/22/2022       Reactions   Lisinopril Other (See Comments)   Angioedema / Hair loss/burned scalp        Medication List     TAKE these medications    albuterol 108 (90 Base) MCG/ACT inhaler Commonly known as: VENTOLIN HFA Inhale 2 puffs into the lungs every 6 (six) hours as needed for wheezing or shortness of breath.   amLODipine 5  MG tablet Commonly known as: NORVASC Take 1 tablet (5 mg total) by mouth at bedtime. What changed: when to take this   aspirin EC 81 MG tablet Take 1 tablet (81 mg total) by mouth daily.   atorvastatin 40 MG tablet Commonly known as: LIPITOR Take 1 tablet (40 mg total) by mouth daily.   carvedilol 25 MG tablet Commonly known as: COREG Take 1.5 tablets (37.5 mg total) by  mouth 2 (two) times daily with a meal.   ibuprofen 200 MG tablet Commonly known as: ADVIL Take 400-800 mg by mouth every 6 (six) hours as needed for moderate pain.   losartan-hydrochlorothiazide 100-25 MG tablet Commonly known as: Hyzaar Take 1 tablet by mouth daily.   pantoprazole 40 MG tablet Commonly known as: Protonix Take 1 tablet (40 mg total) by mouth daily.   potassium chloride SA 20 MEQ tablet Commonly known as: KLOR-CON M Take 1 tablet (20 mEq total) by mouth 2 (two) times daily.        Follow-up Information     [facility] Follow up.   Why: [facility] will call patient after discharge to schedule home set up of CPAP and Oxygen Contact information: [address] [place] [address] [phone]                Discharge Exam: Ceasar Mons Weights   11/19/22 0442 11/19/22 1300  Weight: (!) 138.6 kg (!) 139.8 kg   General-appears in no acute distress Heart-S1-S2, regular, no murmur auscultated Lungs-clear to auscultation bilaterally, no wheezing or crackles auscultated Abdomen-soft, nontender, no organomegaly Extremities-no edema in the lower extremities Neuro-alert, oriented x3, no focal deficit noted  Condition at discharge: good  The results of significant diagnostics from this hospitalization (including imaging, microbiology, ancillary and laboratory) are listed below for reference.   Imaging Studies: DG Chest Port 1 View  Result Date: 11/19/2022 CLINICAL DATA:  Shortness of breath EXAM: PORTABLE CHEST 1 VIEW COMPARISON:  06/06/2021 FINDINGS: Interstitial coarsening is similar to prior. There is no edema, consolidation, effusion, or pneumothorax. Normal heart size and mediastinal contours for technique. Limited chest due to low volumes and kyphotic positioning. IMPRESSION: No acute finding when compared to prior. Electronically Signed   By: Tiburcio Pea M.D.   On: 11/19/2022 05:20   SLEEP STUDY DOCUMENTS  Result Date:  11/01/2022 Ordered by an unspecified provider.   Microbiology: Results for orders placed or performed during the hospital encounter of 11/19/22  Resp panel by RT-PCR (RSV, Flu A&B, Covid) Anterior Nasal Swab     Status: None   Collection Time: 11/19/22  4:50 AM   Specimen: Anterior Nasal Swab  Result Value Ref Range Status   SARS Coronavirus 2 by RT PCR NEGATIVE NEGATIVE Final    Comment: (NOTE) SARS-CoV-2 target nucleic acids are NOT DETECTED.  The SARS-CoV-2 RNA is generally detectable in upper respiratory specimens during the acute phase of infection. The lowest concentration of SARS-CoV-2 viral copies this assay can detect is 138 copies/mL. A negative result does not preclude SARS-Cov-2 infection and should not be used as the sole basis for treatment or other patient management decisions. A negative result may occur with  improper specimen collection/handling, submission of specimen other than nasopharyngeal swab, presence of viral mutation(s) within the areas targeted by this assay, and inadequate number of viral copies(<138 copies/mL). A negative result must be combined with clinical observations, patient history, and epidemiological information. The expected result is Negative.  Fact Sheet for Patients:  BloggerCourse.com  Fact  Sheet for Healthcare Providers:  SeriousBroker.it  This test is no t yet approved or cleared by the [place] FDA and  has been authorized for detection and/or diagnosis of SARS-CoV-2 by FDA under an Emergency Use Authorization (EUA). This EUA will remain  in effect (meaning this test can be used) for the duration of the COVID-19 declaration under Section 564(b)(1) of the Act, 21 U.S.C.section 360bbb-3(b)(1), unless the authorization is terminated  or revoked sooner.       Influenza A by PCR NEGATIVE NEGATIVE Final   Influenza B by PCR NEGATIVE NEGATIVE Final    Comment: (NOTE) The Xpert  Xpress SARS-CoV-2/FLU/RSV plus assay is intended as an aid in the diagnosis of influenza from Nasopharyngeal swab specimens and should not be used as a sole basis for treatment. Nasal washings and aspirates are unacceptable for Xpert Xpress SARS-CoV-2/FLU/RSV testing.  Fact Sheet for Patients: BloggerCourse.com  Fact Sheet for Healthcare Providers: SeriousBroker.it  This test is not yet approved or cleared by the [place] FDA and has been authorized for detection and/or diagnosis of SARS-CoV-2 by FDA under an Emergency Use Authorization (EUA). This EUA will remain in effect (meaning this test can be used) for the duration of the COVID-19 declaration under Section 564(b)(1) of the Act, 21 U.S.C. section 360bbb-3(b)(1), unless the authorization is terminated or revoked.     Resp Syncytial Virus by PCR NEGATIVE NEGATIVE Final    Comment: (NOTE) Fact Sheet for Patients: BloggerCourse.com  Fact Sheet for Healthcare Providers: SeriousBroker.it  This test is not yet approved or cleared by the [place] FDA and has been authorized for detection and/or diagnosis of SARS-CoV-2 by FDA under an Emergency Use Authorization (EUA). This EUA will remain in effect (meaning this test can be used) for the duration of the COVID-19 declaration under Section 564(b)(1) of the Act, 21 U.S.C. section 360bbb-3(b)(1), unless the authorization is terminated or revoked.  Performed at [facility], [address] [place], [place] [address]     Labs: CBC: Recent Labs  Lab 11/19/22 0450 11/20/22 0421  WBC 7.9 9.8  NEUTROABS 6.1  --   HGB 14.3 13.7  HCT 44.1 42.8  MCV 101.8* 103.4*  PLT 156 160   Basic Metabolic Panel: Recent Labs  Lab 11/19/22 0600 11/19/22 1535 11/20/22 0421 11/21/22 0412  NA 143 139 138  --   K 2.3* 4.3 4.3  --   CL 118* 104 102  --   CO2  22 26 28   --   GLUCOSE 132* 188* 186*  --   BUN 8 11 15   --   CREATININE <0.30* 0.66 0.55  --   CALCIUM 5.6* 9.2 9.2  --   MG 1.5*  --   --  2.2  PHOS 2.5  --   --   --    Liver Function Tests: Recent Labs  Lab 11/19/22 0739 11/20/22 0421  AST 21 18  ALT 24 24  ALKPHOS 70 67  BILITOT 0.9 0.7  PROT 7.0 7.4  ALBUMIN 3.9 3.9   CBG: Recent Labs  Lab 11/21/22 0737 11/21/22 1143 11/21/22 1631 11/21/22 2115 11/22/22 0739  GLUCAP 183* 207* 212* 262* 215*    Discharge time spent: greater than 30 minutes.  Signed: Meredeth Ide, MD [facility] 11/22/2022

## 2022-11-22 NOTE — Progress Notes (Signed)
Discharge medications delivered to patient from pharmacy.  Patient escorted downstairs in stable condition with all personal belongings via wheelchair.  Home CPAP and 02 tank also with patient.  Patient  in discharge lounge [facility] pickup.

## 2022-11-22 NOTE — Plan of Care (Signed)

## 2022-11-22 NOTE — TOC Transition Note (Addendum)
Transition of Care [facility]) - CM/SW Discharge Note   Patient Details  Name: Virginia Levine MRN: [id number] Date of Birth: 11-06-1965  Transition of Care [facility]) CM/SW Contact:  Howell Rucks, RN Phone Number: 11/22/2022, 10:55 AM   Clinical Narrative:   DC home order. [facility] to contact patient after discharge to schedule home set up of CPAP and 02, pt voiced understanding to bedside nurse. No further TOC needs identified.   -12:35 Met with pt at bedside, pt reports her [facility] is out in her apartment and she may stay in a hotel for a few days Mitch with [facility] notified, [facility] will contact pt post dc to confirm address location.     Final next level of care: Home/Self Care Barriers to Discharge: Barriers Resolved   Patient Goals and CMS Choice      Discharge Placement                         Discharge Plan and Services Additional resources added to the After Visit Summary for     Discharge Planning Services: CM Consult            DME Arranged: Continuous positive airway pressure (CPAP), Oxygen DME Agency: [facility] Date DME Agency Contacted: 11/22/22 Time DME Agency Contacted: 1054 Representative spoke with at DME Agency: Marthann Schiller            Social Determinants of Health (SDOH) Interventions SDOH Screenings   Food Insecurity: No Food Insecurity (11/19/2022)  Housing: Low Risk  (11/19/2022)  Transportation Needs: No Transportation Needs (11/19/2022)  Utilities: Not At Risk (11/19/2022)  Depression (PHQ2-9): Medium Risk (10/22/2022)  Tobacco Use: High Risk (11/19/2022)     Readmission Risk Interventions    11/20/2022   10:49 AM  Readmission Risk Prevention Plan  Post Dischage Appt Complete  Medication Screening Complete  Transportation Screening Complete

## 2022-11-22 NOTE — Discharge Instructions (Signed)
You can start taking vitamin D 50,000 units weekly starting Monday 6/24, and then continue taking every Monday for 3 months

## 2022-11-25 ENCOUNTER — Telehealth: Payer: Self-pay

## 2022-11-25 NOTE — Transitions of Care (Post Inpatient/ED Visit) (Signed)
11/25/2022  Name: Virginia Levine MRN: [id number] DOB: [birth year]  Today's TOC FU Call Status: Today's TOC FU Call Status:: Successful TOC FU Call Competed TOC FU Call Complete Date: 11/25/22  Transition Care Management Follow-up Telephone Call Date of Discharge: 11/22/22 Discharge Facility: [facility]) Type of Discharge: Inpatient Admission Primary Inpatient Discharge Diagnosis:: respiratory failure How have you been since you were released from the hospital?: Better Any questions or concerns?: No  Items Reviewed: Did you receive and understand the discharge instructions provided?: Yes Medications obtained,verified, and reconciled?: Yes (Medications Reviewed) Any new allergies since your discharge?: No Dietary orders reviewed?: Yes Do you have support at home?: No  Medications Reviewed Today: Medications Reviewed Today     Reviewed by Karena Addison, LPN (Licensed Practical Nurse) on 11/25/22 at [phone]  Med List Status: <None>   Medication Order Taking? Sig Documenting Provider Last Dose Status Informant  albuterol (VENTOLIN HFA) 108 (90 Base) MCG/ACT inhaler [id number]  Inhale 2 puffs into the lungs every 6 (six) hours as needed for wheezing or shortness of breath. Meredeth Ide, MD  Active   amLODipine (NORVASC) 5 MG tablet [id number] No Take 1 tablet (5 mg total) by mouth at bedtime.  Patient taking differently: Take 5 mg by mouth daily.   Shelby Mattocks, DO 11/18/2022 Active Self, Pharmacy Records  aspirin EC 81 MG tablet [id number] No Take 1 tablet (81 mg total) by mouth daily. Shelby Mattocks, DO 11/18/2022 Active Self, Pharmacy Records  atorvastatin (LIPITOR) 40 MG tablet [id number] No Take 1 tablet (40 mg total) by mouth daily. Shelby Mattocks, DO 11/18/2022 Active Self, Pharmacy Records  carvedilol (COREG) 25 MG tablet [id number] No Take 1.5 tablets (37.5 mg total) by mouth 2 (two) times daily with a meal. Shelby Mattocks, DO 11/18/2022 1700 Active Self, Pharmacy Records   ergocalciferol (VITAMIN D2) 1.25 MG (50000 UT) capsule [id number]  Take 1 capsule (50,000 Units total) by mouth once a week. Meredeth Ide, MD  Active   ibuprofen (ADVIL) 200 MG tablet [id number] No Take 400-800 mg by mouth every 6 (six) hours as needed for moderate pain. [provider] 11/17/2022 Active Self, Pharmacy Records  losartan-hydrochlorothiazide [facility]) 100-25 MG tablet [id number] No Take 1 tablet by mouth daily. Shelby Mattocks, DO 11/18/2022 Active Self, Pharmacy Records  pantoprazole (PROTONIX) 40 MG tablet [id number]  Take 1 tablet (40 mg total) by mouth daily. Meredeth Ide, MD  Active   potassium chloride SA (KLOR-CON M) 20 MEQ tablet [id number]  Take 1 tablet by mouth 2 times daily. Meredeth Ide, MD  Active             Home Care and Equipment/Supplies: Were Home Health Services Ordered?: Yes Name of Home Health Agency:: [facility] Has Agency set up a time to come to your home?: No Any new equipment or medical supplies ordered?: NA  Functional Questionnaire: Do you need assistance with bathing/showering or dressing?: No Do you need assistance with meal preparation?: No Do you need assistance with eating?: No Do you have difficulty maintaining continence: No Do you need assistance with getting out of bed/getting out of a chair/moving?: No Do you have difficulty managing or taking your medications?: No  Follow up appointments reviewed: PCP Follow-up appointment confirmed?: Yes Date of PCP follow-up appointment?: 12/10/22 Follow-up Provider: [facility] Follow-up appointment confirmed?: NA Do you need transportation to your follow-up appointment?: No Do you understand care options if your condition(s) worsen?: Yes-patient verbalized understanding    SIGNATURE Karena Addison,  LPN [facility] Nurse Health Advisor Direct Dial [phone]

## 2022-11-26 DIAGNOSIS — I2510 Atherosclerotic heart disease of native coronary artery without angina pectoris: Secondary | ICD-10-CM | POA: Diagnosis not present

## 2022-11-26 DIAGNOSIS — J9601 Acute respiratory failure with hypoxia: Secondary | ICD-10-CM | POA: Diagnosis not present

## 2022-11-26 DIAGNOSIS — G4733 Obstructive sleep apnea (adult) (pediatric): Secondary | ICD-10-CM | POA: Diagnosis not present

## 2022-11-26 DIAGNOSIS — I10 Essential (primary) hypertension: Secondary | ICD-10-CM | POA: Diagnosis not present

## 2022-11-27 DIAGNOSIS — G4733 Obstructive sleep apnea (adult) (pediatric): Secondary | ICD-10-CM | POA: Diagnosis not present

## 2022-12-02 DIAGNOSIS — J9601 Acute respiratory failure with hypoxia: Secondary | ICD-10-CM | POA: Diagnosis not present

## 2022-12-02 DIAGNOSIS — I2510 Atherosclerotic heart disease of native coronary artery without angina pectoris: Secondary | ICD-10-CM | POA: Diagnosis not present

## 2022-12-02 DIAGNOSIS — I10 Essential (primary) hypertension: Secondary | ICD-10-CM | POA: Diagnosis not present

## 2022-12-02 DIAGNOSIS — G4733 Obstructive sleep apnea (adult) (pediatric): Secondary | ICD-10-CM | POA: Diagnosis not present

## 2022-12-03 ENCOUNTER — Ambulatory Visit: Payer: Medicaid Other

## 2022-12-10 ENCOUNTER — Ambulatory Visit: Payer: Medicaid Other | Admitting: Student

## 2022-12-10 NOTE — Progress Notes (Deleted)
  SUBJECTIVE:   CHIEF COMPLAINT / HPI:   Presents today for hospital follow-up.  She presented for acute respiratory failure with hypoxia and was discharged on 3 L O2 in addition to CPAP.  Recommendations on discharge include recheck vitamin D in 3 months and establishing for sleep study.  PERTINENT  PMH / PSH: HTN, HLD, obesity, OSA on CPAP, T2DM, cardiomegaly, ascending aortic dilation, tobacco use  Patient Care Team: Shelby Mattocks, DO as PCP - General (Family Medicine) Meriam Sprague, MD as PCP - Cardiology (Cardiology) OBJECTIVE:  LMP 11/21/2012  Physical Exam   ASSESSMENT/PLAN:  There are no diagnoses linked to this encounter. No follow-ups on file. Shelby Mattocks, DO 12/10/2022, 7:48 AM PGY-***, [facility] {    This will disappear when note is signed, click to select method of visit    :1}

## 2022-12-12 ENCOUNTER — Other Ambulatory Visit (HOSPITAL_COMMUNITY): Payer: Self-pay

## 2022-12-12 ENCOUNTER — Ambulatory Visit (INDEPENDENT_AMBULATORY_CARE_PROVIDER_SITE_OTHER): Payer: Medicaid Other | Admitting: Student

## 2022-12-12 VITALS — BP 136/82 | HR 80 | Ht 67.0 in | Wt 307.2 lb

## 2022-12-12 DIAGNOSIS — E559 Vitamin D deficiency, unspecified: Secondary | ICD-10-CM

## 2022-12-12 DIAGNOSIS — Z720 Tobacco use: Secondary | ICD-10-CM

## 2022-12-12 DIAGNOSIS — E119 Type 2 diabetes mellitus without complications: Secondary | ICD-10-CM | POA: Diagnosis not present

## 2022-12-12 DIAGNOSIS — Z789 Other specified health status: Secondary | ICD-10-CM | POA: Diagnosis not present

## 2022-12-12 DIAGNOSIS — G4733 Obstructive sleep apnea (adult) (pediatric): Secondary | ICD-10-CM

## 2022-12-12 MED ORDER — TRULICITY 0.75 MG/0.5ML ~~LOC~~ SOAJ
0.7500 mg | SUBCUTANEOUS | 0 refills | Status: DC
  Filled 2022-12-12 – 2022-12-13 (×2): qty 2, 28d supply, fill #0

## 2022-12-12 MED ORDER — VITAMIN D (ERGOCALCIFEROL) 1.25 MG (50000 UNIT) PO CAPS
50000.0000 [IU] | ORAL_CAPSULE | ORAL | 0 refills | Status: DC
Start: 2022-12-12 — End: 2023-08-07
  Filled 2022-12-12 – 2022-12-13 (×2): qty 4, 28d supply, fill #0

## 2022-12-12 NOTE — Patient Instructions (Signed)
It was great to see you today! Thank you for choosing [facility] for your primary care.  Today we addressed: Diabetes: restart trulicity Vitamin D: supplied more Smoking cessation: see Dr. Raymondo Band Stopped pantroprazole CPAP: seeing [facility] on 7/16  If you haven't already, sign up for My Chart to have easy access to your labs results, and communication with your primary care physician.  You should return to our clinic Return for smoking cessation. Please arrive 15 minutes before your appointment to ensure smooth check in process.  We appreciate your efforts in making this happen.  Thank you for allowing me to participate in your care, Shelby Mattocks, DO 12/12/2022, 4:46 PM PGY-3, [facility]

## 2022-12-12 NOTE — Progress Notes (Signed)
  SUBJECTIVE:   CHIEF COMPLAINT / HPI:   Hospital follow-up: Admitted on 11/19/2022 and discharged on 11/22/2022 for acute respiratory failure with hypoxia.  She received Solu-Medrol but was not discharged on steroids.  She was discharged on 3 L O2.  According documentation and history, her CPAP machine was not working for her as the mask was not fitting appropriately.  Additionally her vitamin D level was severely low and she was started on vitamin D supplementation.  She was further discharged on Protonix but does not feel like that is working for her.  PERTINENT  PMH / PSH: HTN, HLD, obesity, OSA on CPAP, T2DM, cardiomegaly, ascending aortic dilation, tobacco use   Patient Care Team: Shelby Mattocks, DO as PCP - General (Family Medicine) Meriam Sprague, MD as PCP - Cardiology (Cardiology) OBJECTIVE:  BP 136/82   Pulse 80   Ht 5\' 7"  (1.702 m)   Wt (!) 307 lb 3.2 oz (139.3 kg)   LMP 11/21/2012   SpO2 94%   BMI 48.11 kg/m  Physical Exam Cardiovascular:     Rate and Rhythm: Normal rate and regular rhythm.     Heart sounds: Normal heart sounds.  Pulmonary:     Effort: Pulmonary effort is normal.     Breath sounds: Wheezing (Trace) present.    ASSESSMENT/PLAN:  OSA (obstructive sleep apnea) Assessment & Plan: Diagnosed prior, apparently her mask did not fit well and she is scheduled with [facility] for this on Tuesday next week.  She is using oxygen to sleep at night as advised upon discharge from hospitalization.   Vitamin D deficiency Assessment & Plan: According to patient, she was only given 1 month supply of 50 IU of vitamin D, I have provided another month supply.  Recheck middle of September.  Orders: -     Vitamin D (Ergocalciferol); Take 1 capsule (50,000 Units total) by mouth every 7 (seven) days.  Dispense: 4 capsule; Refill: 0  Type 2 diabetes mellitus without complication, without long-term current use of insulin (HCC) Assessment & Plan: Restart Trulicity.  Taper  up monthly as tolerated.  Orders: -     Trulicity; Inject 0.75 mg into the skin once a week.  Dispense: 2 mL; Refill: 0  Tobacco abuse Assessment & Plan: Advised scheduling with Dr. Raymondo Band for smoking cessation   Alcohol use Assessment & Plan: She drinks wine regularly.  I advised him to follow-up with me during separate appointment to address strictly this and nutritional conversation.   Shelby Mattocks, DO 12/13/2022, 9:09 AM PGY-3, [facility]

## 2022-12-13 ENCOUNTER — Telehealth: Payer: Self-pay

## 2022-12-13 ENCOUNTER — Other Ambulatory Visit (HOSPITAL_COMMUNITY): Payer: Self-pay

## 2022-12-13 ENCOUNTER — Other Ambulatory Visit: Payer: Self-pay

## 2022-12-13 DIAGNOSIS — Z789 Other specified health status: Secondary | ICD-10-CM | POA: Insufficient documentation

## 2022-12-13 NOTE — Telephone Encounter (Signed)
Patient calls nurse line requesting to speak with PCP.   She reports she has been considering applying for disability due to her shortness of breath.   She reports she would like PCP opinion on this.   Will forward to PCP.

## 2022-12-13 NOTE — Assessment & Plan Note (Signed)
Advised scheduling with Dr. Raymondo Band for smoking cessation

## 2022-12-13 NOTE — Assessment & Plan Note (Signed)
Restart Trulicity.  Taper up monthly as tolerated.

## 2022-12-13 NOTE — Telephone Encounter (Signed)
Spoke with Virginia Levine. She does not want to go on disability but considered this because of her shortness of breath. We will pursue further workup at her next visit. I do not feel she is a candidate for disability at this time and she endorsed wanting to work if she can.

## 2022-12-13 NOTE — Assessment & Plan Note (Signed)
According to patient, she was only given 1 month supply of 50 IU of vitamin D, I have provided another month supply.  Recheck middle of September.

## 2022-12-13 NOTE — Assessment & Plan Note (Signed)
Diagnosed prior, apparently her mask did not fit well and she is scheduled with [facility] for this on Tuesday next week.  She is using oxygen to sleep at night as advised upon discharge from hospitalization.

## 2022-12-13 NOTE — Assessment & Plan Note (Signed)
She drinks wine regularly.  I advised him to follow-up with me during separate appointment to address strictly this and nutritional conversation.

## 2022-12-13 NOTE — Telephone Encounter (Signed)
Pharmacy Patient Advocate Encounter   Received notification from CoverMyMeds that prior authorization for Trulicity is required/requested.   PA submitted to [facility] MEDICAID via CoverMyMeds Key/confirmation #/EOC [id number] Status is pending

## 2022-12-16 ENCOUNTER — Other Ambulatory Visit (HOSPITAL_COMMUNITY): Payer: Self-pay

## 2022-12-16 NOTE — Telephone Encounter (Signed)
PA denied - resubmitted with addt'l diagnosis information per Medicaid request.  Key [id number]

## 2022-12-17 ENCOUNTER — Encounter: Payer: Self-pay | Admitting: Pharmacist

## 2022-12-17 ENCOUNTER — Other Ambulatory Visit (HOSPITAL_COMMUNITY): Payer: Self-pay

## 2022-12-17 ENCOUNTER — Other Ambulatory Visit: Payer: Self-pay

## 2022-12-17 LAB — BASIC METABOLIC PANEL: Glucose: 124

## 2022-12-17 NOTE — Congregational Nurse Program (Signed)
  Dept: [phone]   Congregational Nurse Program Note  Date of Encounter: 12/17/2022  Clinic visit for shortness of breath and to check blood pressure.  BP 121/81, pulse 81 and regular, respirations 20, O2 Sst 90%.  No wheezing, rales or rubs, took two puffs of Ventolin and sat in clinic for 5 minutes.  Blood glucose 124 AC lunch.  Ate breakfast approximately 8A, discussed normal pre and post meal blood glucose levels.  Educated regarding using Trulicity which she is to start this week.. 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   Echocardiogram 10/2021: Ascending aorta 39 mm   Cardiomegaly 07/10/2021   Echocardiogram 10/2021: EF 60-65, no RWMA, mod LVH, Gr 1 DD, normal RVSF, normal PASP, severe LAE, mild MR, ascending aorta 39 mm   Coronary artery disease 07/24/2021   CCTA 2/23: CAC score 601 (99th percentile); pLAD 25-49, mLAD 1-24, pD1 25-49, pLCx 25-49, pOM1 1-24; FFR negative; Asc Aorta 3.6 cm; hepatic steatosis    Hyperlipidemia    Hypertension    Obesity    OSA (obstructive sleep apnea)    Pre-diabetes    Tobacco use    Vitamin D deficiency 11/19/2022    Encounter Details:  CNP Questionnaire - 12/17/22 1125       Questionnaire   Ask client: Do you give verbal consent for me to treat you today? Yes    Student Assistance N/A    Location Patient Served  [facility]    Visit Setting with Client Organization    Patient Status Unknown   Has own apartment in [facility]    Insurance/Financial Assistance Referral Medicaid    Medication N/A    Medical Provider Yes    Screening Referrals Made N/A    Medical Referrals Made N/A    Medical Appointment Made N/A    Recently w/o PCP, now 1st time PCP visit completed due to CNs referral or appointment made N/A    Food N/A    Transportation N/A    Housing/Utilities N/A    Interpersonal Safety N/A    Interventions  Advocate/Support;Counsel;Educate;Spiritual Care;Reviewed Medications    Abnormal to Normal Screening Since Last CN Visit N/A    Screenings CN Performed Blood Pressure;Blood Glucose;Pulse Ox    Sent Client to Lab for: N/A    Did client attend any of the following based off CNs referral or appointments made? N/A    ED Visit Averted N/A    Life-Saving Intervention Made N/A

## 2022-12-19 NOTE — Telephone Encounter (Signed)
Pharmacy Patient Advocate Encounter  Received notification from  [facility] [employer]  that Prior Authorization for TRULICITY has been APPROVED from 12/16/22 to 12/16/23.Marland Kitchen  PA #/Case ID/Reference #: [id number]

## 2022-12-24 LAB — BASIC METABOLIC PANEL: Glucose: 116

## 2022-12-24 NOTE — Congregational Nurse Program (Signed)
  Dept: [phone]   Congregational Nurse Program Note  Date of Encounter: 12/24/2022  Clinic visit to check blood pressure and blood glucose.  Started weekly Trulicity injections on 12/23/2022.  Has diagnosis of prediabetes.  BP 114/78, pulse 82 and regular.  Blood glucose 116, 2 hours pc breakfast  reviewed medication side effects to be aware of with Trulicity and the A1c levels for prediabetes vs diabetes. Discussed death of her brother last week and job change to have less stress. 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   Echocardiogram 10/2021: Ascending aorta 39 mm   Cardiomegaly 07/10/2021   Echocardiogram 10/2021: EF 60-65, no RWMA, mod LVH, Gr 1 DD, normal RVSF, normal PASP, severe LAE, mild MR, ascending aorta 39 mm   Coronary artery disease 07/24/2021   CCTA 2/23: CAC score 601 (99th percentile); pLAD 25-49, mLAD 1-24, pD1 25-49, pLCx 25-49, pOM1 1-24; FFR negative; Asc Aorta 3.6 cm; hepatic steatosis    Hyperlipidemia    Hypertension    Obesity    OSA (obstructive sleep apnea)    Pre-diabetes    Tobacco use    Vitamin D deficiency 11/19/2022    Encounter Details:  CNP Questionnaire - 12/24/22 1015       Questionnaire   Ask client: Do you give verbal consent for me to treat you today? Yes    Student Assistance N/A    Location Patient Served  [facility]    Visit Setting with Client Organization    Patient Status Unknown   Has own apartment in [facility]    Insurance/Financial Assistance Referral Medicaid    Medication N/A    Medical Provider Yes    Screening Referrals Made N/A    Medical Referrals Made N/A    Medical Appointment Made N/A    Recently w/o PCP, now 1st time PCP visit completed due to CNs referral or appointment made N/A    Food N/A    Transportation N/A    Housing/Utilities N/A    Interpersonal Safety N/A    Interventions  Advocate/Support;Counsel;Educate;Spiritual Care;Reviewed Medications    Abnormal to Normal Screening Since Last CN Visit N/A    Screenings CN Performed Blood Pressure;Blood Glucose    Sent Client to Lab for: N/A    Did client attend any of the following based off CNs referral or appointments made? N/A    ED Visit Averted N/A    Life-Saving Intervention Made N/A

## 2022-12-30 ENCOUNTER — Ambulatory Visit: Payer: Medicaid Other | Admitting: Student

## 2022-12-31 ENCOUNTER — Other Ambulatory Visit (HOSPITAL_COMMUNITY): Payer: Self-pay

## 2023-01-02 DIAGNOSIS — I10 Essential (primary) hypertension: Secondary | ICD-10-CM | POA: Diagnosis not present

## 2023-01-02 DIAGNOSIS — G4733 Obstructive sleep apnea (adult) (pediatric): Secondary | ICD-10-CM | POA: Diagnosis not present

## 2023-01-02 DIAGNOSIS — J9601 Acute respiratory failure with hypoxia: Secondary | ICD-10-CM | POA: Diagnosis not present

## 2023-01-02 DIAGNOSIS — I2510 Atherosclerotic heart disease of native coronary artery without angina pectoris: Secondary | ICD-10-CM | POA: Diagnosis not present

## 2023-01-05 NOTE — Progress Notes (Signed)
  SUBJECTIVE:   CHIEF COMPLAINT / HPI:   Alcohol use: she has stopped drinking.  Tobacco use: currently smoking 10 cigarettes a day.   Diabetes: She notes that she was able to handle the 0.75 mg weekly dosing of Trulicity appropriately.  She is amenable to increasing dosage.  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  She does note that the paperwork and getting established that has been stressful and thinks that may be why her blood pressure is slightly elevated.  She has been wearing her CPAP every night.  PERTINENT  PMH / PSH: HTN, HLD, obesity, OSA on CPAP, T2DM, cardiomegaly, ascending aortic dilation, tobacco use   Patient Care Team: Shelby Mattocks, DO as PCP - General (Family Medicine) Meriam Sprague, MD as PCP - Cardiology (Cardiology) OBJECTIVE:  BP (!) 140/80   Pulse 84   Ht 5\' 7"  (1.702 m)   Wt (!) 311 lb 9.6 oz (141.3 kg)   LMP 11/21/2012   SpO2 91%   BMI 48.80 kg/m  General: Well-appearing, NAD Psych: Normal mood and affect  ASSESSMENT/PLAN:  Type 2 diabetes mellitus without complication, without long-term current use of insulin (HCC) Assessment & Plan: Increase Trulicity to 1.5 mg weekly.  We had an extensive educational conversation about reading nutrition labels.  She is going to attempt to pay closer attention to added sugars and protein and many of her foods.  Orders: -     Trulicity; Inject 1.5 mg into the skin once a week.  Dispense: 2 mL; Refill: 0  Essential hypertension Assessment & Plan: BP: (!) 140/80 today. Poorly controlled. Goal of <130/80. Continue to work on healthy dietary habits and exercise. Follow up in 4 weeks.  Blood pressure log provided. Medication regimen: Amlodipine 5 mg daily, losartan-HCTZ 100-25 mg daily, carvedilol 37.5 mg twice daily   OSA (obstructive sleep apnea) Assessment & Plan: Regularly using CPAP.  No  changes indicated.   Alcohol use Assessment & Plan: Currently not participating in any alcohol use.   Tobacco abuse Assessment & Plan: Advise discussing at next visit if she is amenable.   Return in about 4 weeks (around 02/03/2023) for Follow-up. Shelby Mattocks, DO 01/06/2023, 4:46 PM PGY-2, [facility]

## 2023-01-06 ENCOUNTER — Encounter: Payer: Self-pay | Admitting: Student

## 2023-01-06 ENCOUNTER — Other Ambulatory Visit (HOSPITAL_COMMUNITY): Payer: Self-pay

## 2023-01-06 ENCOUNTER — Ambulatory Visit (INDEPENDENT_AMBULATORY_CARE_PROVIDER_SITE_OTHER): Payer: Medicaid Other | Admitting: Student

## 2023-01-06 VITALS — BP 140/80 | HR 84 | Ht 67.0 in | Wt 311.6 lb

## 2023-01-06 DIAGNOSIS — Z720 Tobacco use: Secondary | ICD-10-CM | POA: Diagnosis not present

## 2023-01-06 DIAGNOSIS — I10 Essential (primary) hypertension: Secondary | ICD-10-CM | POA: Diagnosis not present

## 2023-01-06 DIAGNOSIS — Z789 Other specified health status: Secondary | ICD-10-CM | POA: Diagnosis not present

## 2023-01-06 DIAGNOSIS — G4733 Obstructive sleep apnea (adult) (pediatric): Secondary | ICD-10-CM

## 2023-01-06 DIAGNOSIS — E119 Type 2 diabetes mellitus without complications: Secondary | ICD-10-CM | POA: Diagnosis not present

## 2023-01-06 MED ORDER — TRULICITY 1.5 MG/0.5ML ~~LOC~~ SOAJ
1.5000 mg | SUBCUTANEOUS | 0 refills | Status: DC
Start: 2023-01-06 — End: 2023-03-08
  Filled 2023-01-06 – 2023-01-13 (×2): qty 2, 28d supply, fill #0

## 2023-01-06 NOTE — Assessment & Plan Note (Signed)
Currently not participating in any alcohol use.

## 2023-01-06 NOTE — Assessment & Plan Note (Signed)
Increase Trulicity to 1.5 mg weekly.  We had an extensive educational conversation about reading nutrition labels.  She is going to attempt to pay closer attention to added sugars and protein and many of her foods.

## 2023-01-06 NOTE — Patient Instructions (Addendum)
It was great to see you today! Thank you for choosing [facility] for your primary care.  Today we addressed: We increased your Trulicity today.  At your next visit we can increase again if you are still tolerating this well.  And educated on nutrition label use.  I have attached a handout for nutrition label use as well.  Excellent job on decreasing your alcohol intake.  If you would like next time, we can also talk about decreasing your tobacco use if that is something you are interested in. I would like to keep an eye on your blood pressure a little closer and have provided a log for you to fill out and bring to your next visit.  If your blood pressures at home are regularly staying above 140/90 please let me know and we can make changes before your next visit.  If you haven't already, sign up for My Chart to have easy access to your labs results, and communication with your primary care physician.  Return in about 4 weeks (around 02/03/2023) for Follow-up. Please arrive 15 minutes before your appointment to ensure smooth check in process.  We appreciate your efforts in making this happen.  Thank you for allowing me to participate in your care, Shelby Mattocks, DO 01/06/2023, 4:26 PM PGY-3, [facility]   Blood Pressure Record Sheet To take your blood pressure, you will need a blood pressure machine. You can buy a blood pressure machine (blood pressure monitor) at your clinic, drug store, or online. When choosing one, consider: An automatic monitor that has an arm cuff. A cuff that wraps snugly around your upper arm. You should be able to fit only one finger between your arm and the cuff. A device that stores blood pressure reading results. Do not choose a monitor that measures your blood pressure from your wrist or finger. Follow your health care provider's instructions for how to take your blood pressure. To use this form: Take your blood pressure medications every  day These measurements should be taken when you have been at rest for at least 10-15 min Take at least 2 readings with each blood pressure check. This makes sure the results are correct. Wait 1-2 minutes between measurements. Write down the results in the spaces on this form. Keep in mind it should always be recorded systolic over diastolic. Both numbers are important.  Repeat this every day for 2-3 weeks, or as told by your health care provider.  Make a follow-up appointment with your health care provider to discuss the results.  Blood Pressure Log Date Medications taken? (Y/N) Blood Pressure Time of Day

## 2023-01-06 NOTE — Assessment & Plan Note (Addendum)
Regularly using CPAP.  No changes indicated.

## 2023-01-06 NOTE — Assessment & Plan Note (Signed)
BP: (!) 140/80 today. Poorly controlled. Goal of <130/80. Continue to work on healthy dietary habits and exercise. Follow up in 4 weeks.  Blood pressure log provided. Medication regimen: Amlodipine 5 mg daily, losartan-HCTZ 100-25 mg daily, carvedilol 37.5 mg twice daily

## 2023-01-06 NOTE — Assessment & Plan Note (Signed)
Advise discussing at next visit if she is amenable.

## 2023-01-09 ENCOUNTER — Other Ambulatory Visit (HOSPITAL_COMMUNITY): Payer: Self-pay

## 2023-01-09 ENCOUNTER — Other Ambulatory Visit: Payer: Self-pay

## 2023-01-09 DIAGNOSIS — I10 Essential (primary) hypertension: Secondary | ICD-10-CM

## 2023-01-09 MED ORDER — LOSARTAN POTASSIUM-HCTZ 100-25 MG PO TABS
1.0000 | ORAL_TABLET | Freq: Every day | ORAL | 3 refills | Status: DC
Start: 2023-01-09 — End: 2023-04-04
  Filled 2023-01-09 – 2023-01-13 (×2): qty 90, 90d supply, fill #0
  Filled 2023-02-05: qty 90, 90d supply, fill #1

## 2023-01-10 ENCOUNTER — Other Ambulatory Visit: Payer: Self-pay

## 2023-01-10 NOTE — Progress Notes (Signed)
   Virginia Levine 07/10/1965 [id number]  Patient outreached by Mack Guise , PharmD Candidate on 01/10/2023.  Blood Pressure Readings: Last documented ambulatory systolic blood pressure: 140 Last documented ambulatory diastolic blood pressure: 80 Does the patient have a validated home blood pressure machine?: Yes They report home readings NA  Medication review was performed. Is the patient taking their medications as prescribed?: Yes Differences from their prescribed list include: NA  The following barriers to adherence were noted: Does the patient have cost concerns?: No Does the patient have transportation concerns?: No Does the patient need assistance obtaining refills?: No Does the patient occassionally forget to take some of their prescribed medications?: No Does the patient feel like one/some of their medications make them feel poorly?: No Does the patient have questions or concerns about their medications?: No Does the patient have a follow up scheduled with their primary care provider/cardiologist?: Yes   Interventions: Interventions Completed: Medications were reviewed, Patient was educated on proper technique to check home blood pressure and reminded to bring home machine and readings to next provider appointment  The patient has follow up scheduled:  PCP: Shelby Mattocks, DO   Mack Guise, Student-PharmD

## 2023-01-13 ENCOUNTER — Other Ambulatory Visit (HOSPITAL_COMMUNITY): Payer: Self-pay

## 2023-01-13 ENCOUNTER — Other Ambulatory Visit: Payer: Self-pay

## 2023-01-14 ENCOUNTER — Encounter: Payer: Medicaid Other | Admitting: Student

## 2023-02-02 DIAGNOSIS — I2510 Atherosclerotic heart disease of native coronary artery without angina pectoris: Secondary | ICD-10-CM | POA: Diagnosis not present

## 2023-02-02 DIAGNOSIS — J9601 Acute respiratory failure with hypoxia: Secondary | ICD-10-CM | POA: Diagnosis not present

## 2023-02-02 DIAGNOSIS — G4733 Obstructive sleep apnea (adult) (pediatric): Secondary | ICD-10-CM | POA: Diagnosis not present

## 2023-02-02 DIAGNOSIS — I10 Essential (primary) hypertension: Secondary | ICD-10-CM | POA: Diagnosis not present

## 2023-02-05 ENCOUNTER — Other Ambulatory Visit (HOSPITAL_COMMUNITY): Payer: Self-pay

## 2023-02-17 ENCOUNTER — Other Ambulatory Visit (HOSPITAL_COMMUNITY): Payer: Self-pay

## 2023-02-17 ENCOUNTER — Other Ambulatory Visit: Payer: Self-pay

## 2023-02-17 DIAGNOSIS — I10 Essential (primary) hypertension: Secondary | ICD-10-CM

## 2023-02-18 ENCOUNTER — Telehealth: Payer: Self-pay | Admitting: Pharmacist

## 2023-02-18 ENCOUNTER — Other Ambulatory Visit: Payer: Self-pay

## 2023-02-18 ENCOUNTER — Telehealth: Payer: Self-pay

## 2023-02-18 NOTE — Telephone Encounter (Signed)
Patient calls nurse line requesting to speak with PCP.   She reports she has been dealing with constipation since starting Trulicity.   She reports she does not go long periods of time without a BM, however reports she has to strain and they are "very hard."   She denies any blood, abdominal pain or nausea.  Will forward to PCP for recommendations.

## 2023-02-18 NOTE — Telephone Encounter (Signed)
Contacted patient for follow-up of blood pressure/hypertension control.   Patient was not interested in scheduling an appointment for a BP check.  Total time with patient call and documentation of interaction: 5 minutes.

## 2023-02-19 NOTE — Telephone Encounter (Signed)
Patient contacted an advised of PCP recommendation.

## 2023-02-20 ENCOUNTER — Other Ambulatory Visit: Payer: Self-pay | Admitting: Student

## 2023-02-20 ENCOUNTER — Other Ambulatory Visit (HOSPITAL_COMMUNITY): Payer: Self-pay

## 2023-02-20 DIAGNOSIS — I10 Essential (primary) hypertension: Secondary | ICD-10-CM

## 2023-02-24 ENCOUNTER — Other Ambulatory Visit (HOSPITAL_COMMUNITY): Payer: Self-pay

## 2023-02-25 NOTE — Telephone Encounter (Signed)
Patient returns call to nurse line regarding potassium refill.   Please advise if patient needs to schedule a follow up appointment prior to receiving refill.  Veronda Prude, RN

## 2023-02-26 ENCOUNTER — Other Ambulatory Visit: Payer: Self-pay | Admitting: Student

## 2023-02-26 ENCOUNTER — Other Ambulatory Visit (HOSPITAL_COMMUNITY): Payer: Self-pay

## 2023-02-26 DIAGNOSIS — I10 Essential (primary) hypertension: Secondary | ICD-10-CM

## 2023-02-27 ENCOUNTER — Ambulatory Visit: Payer: Medicaid Other | Admitting: Podiatry

## 2023-02-27 DIAGNOSIS — L600 Ingrowing nail: Secondary | ICD-10-CM

## 2023-02-27 DIAGNOSIS — M79674 Pain in right toe(s): Secondary | ICD-10-CM

## 2023-02-27 DIAGNOSIS — M79675 Pain in left toe(s): Secondary | ICD-10-CM | POA: Diagnosis not present

## 2023-02-27 DIAGNOSIS — B351 Tinea unguium: Secondary | ICD-10-CM

## 2023-02-27 DIAGNOSIS — E119 Type 2 diabetes mellitus without complications: Secondary | ICD-10-CM

## 2023-02-27 NOTE — Progress Notes (Signed)
Subjective:  Patient ID: Virginia Levine, female    DOB: 1965/11/19,  MRN: [id number]  Chief Complaint  Patient presents with   Nail Problem    Diabetic Foot Care-nail trim     57 y.o. female presents with the above complaint. History confirmed with patient. Patient presenting with pain related to dystrophic thickened elongated nails. Patient is unable to trim own nails related to nail dystrophy and/or mobility issues. Patient does  have a history of T2DM.  Patient also has pain at the border of the right great toenail concern for ingrown  Objective:  Physical Exam: warm, good capillary refill nail exam normal nails without lesions, onychomycosis of the toenails, onycholysis, and dystrophic nails ingrown right hallux medial border with pain on palpation and incurvation of the nail at this location DP pulses palpable, PT pulses palpable, and protective sensation intact Left Foot:  Pain with palpation of nails due to elongation and dystrophic growth.  Right Foot: Pain with palpation of nails due to elongation and dystrophic growth.   Assessment:   1. Pain due to onychomycosis of toenails of both feet   2. Ingrown nail of great toe of right foot   3. Type 2 diabetes mellitus without complication, without long-term current use of insulin (HCC)        Plan:  Patient was evaluated and treated and all questions answered.  #ingrown nail right great toe  - Wills schedule ingrown removal procedure in near future  #Onychomycosis with pain  -Nails palliatively debrided as below. -Educated on self-care  Procedure: Nail Debridement Rationale: Pain Type of Debridement: manual, sharp debridement. Instrumentation: Nail nipper, rotary burr. Number of Nails: 10  Return in about 2 weeks (around 03/13/2023) for Ingrown nail right grea toe.         Corinna Gab, DPM [facility]

## 2023-02-27 NOTE — Telephone Encounter (Signed)
Called patient and informed her of Dr. Delaney Meigs message concerning her request for potassium.  Patient has questions which she will address on the next appointment in October.  Glennie Hawk, CMA

## 2023-03-08 ENCOUNTER — Other Ambulatory Visit (HOSPITAL_COMMUNITY): Payer: Self-pay

## 2023-03-08 ENCOUNTER — Other Ambulatory Visit: Payer: Self-pay | Admitting: Student

## 2023-03-08 DIAGNOSIS — R059 Cough, unspecified: Secondary | ICD-10-CM | POA: Diagnosis not present

## 2023-03-08 DIAGNOSIS — E119 Type 2 diabetes mellitus without complications: Secondary | ICD-10-CM

## 2023-03-08 DIAGNOSIS — Z20822 Contact with and (suspected) exposure to covid-19: Secondary | ICD-10-CM | POA: Diagnosis not present

## 2023-03-10 ENCOUNTER — Other Ambulatory Visit (HOSPITAL_COMMUNITY): Payer: Self-pay

## 2023-03-10 ENCOUNTER — Other Ambulatory Visit: Payer: Self-pay

## 2023-03-10 MED ORDER — TRULICITY 1.5 MG/0.5ML ~~LOC~~ SOAJ
1.5000 mg | SUBCUTANEOUS | 3 refills | Status: DC
Start: 2023-03-10 — End: 2023-07-16
  Filled 2023-03-10: qty 2, 28d supply, fill #0
  Filled 2023-07-16: qty 2, 28d supply, fill #1

## 2023-03-12 DIAGNOSIS — Z20822 Contact with and (suspected) exposure to covid-19: Secondary | ICD-10-CM | POA: Diagnosis not present

## 2023-03-12 DIAGNOSIS — R059 Cough, unspecified: Secondary | ICD-10-CM | POA: Diagnosis not present

## 2023-03-16 DIAGNOSIS — R059 Cough, unspecified: Secondary | ICD-10-CM | POA: Diagnosis not present

## 2023-03-16 DIAGNOSIS — Z20822 Contact with and (suspected) exposure to covid-19: Secondary | ICD-10-CM | POA: Diagnosis not present

## 2023-03-17 ENCOUNTER — Other Ambulatory Visit (HOSPITAL_COMMUNITY): Payer: Self-pay

## 2023-03-17 ENCOUNTER — Ambulatory Visit: Payer: Medicaid Other | Admitting: Student

## 2023-03-17 VITALS — BP 138/80 | HR 88 | Ht 67.0 in | Wt 303.0 lb

## 2023-03-17 DIAGNOSIS — F32A Depression, unspecified: Secondary | ICD-10-CM | POA: Diagnosis not present

## 2023-03-17 DIAGNOSIS — F419 Anxiety disorder, unspecified: Secondary | ICD-10-CM | POA: Diagnosis present

## 2023-03-17 MED ORDER — CITALOPRAM HYDROBROMIDE 10 MG PO TABS
10.0000 mg | ORAL_TABLET | Freq: Every day | ORAL | 0 refills | Status: DC
Start: 2023-03-17 — End: 2023-10-03
  Filled 2023-03-17: qty 30, 30d supply, fill #0

## 2023-03-17 NOTE — Assessment & Plan Note (Signed)
Patient is ready to start medication.  Will begin with 10 mg Celexa.  If patient tolerates can increase dose at next visit with PCP.  Also offered therapy resources which patient agreed to call and set up an appointment.

## 2023-03-17 NOTE — Progress Notes (Signed)
    SUBJECTIVE:   CHIEF COMPLAINT / HPI:   Caila Cirelli is a 57 y.o. female  presenting for leg pain and mood follow up.  She has been under a lot of pressure at work and has been unable to afford her rent the last month and a half.  This is caused her severe anxiety and depression.  She is not have any family support in the area.  She denies thoughts of hurting herself or others.  Rash: Present on the back of her right calf for 2 to 3 days.  She describes it as rough feeling and darker in color.  She denies history of burning, pain, irritation.  PERTINENT  PMH / PSH: Reviewed and updated   OBJECTIVE:   BP 138/80   Pulse 88   Ht 5\' 7"  (1.702 m)   Wt (!) 303 lb (137.4 kg)   LMP 11/21/2012   SpO2 92%   BMI 47.46 kg/m   Well-appearing, no acute distress Cardio: Regular rate, regular rhythm, no murmurs on exam. Pulm: Clear, no wheezing, no crackles. No increased work of breathing Abdominal: bowel sounds present, soft, non-tender, non-distended Extremities: no peripheral edema  Neuro: alert and oriented x3, speech normal in content, no facial asymmetry, strength intact and equal bilaterally in UE and LE, pupils equal and reactive to light.  Psych:  Cognition and judgment appear intact. Alert, communicative  and cooperative with normal attention span and concentration. No apparent delusions, illusions, hallucinations      03/17/2023    2:08 PM 12/12/2022    4:41 PM 10/22/2022    3:55 PM  PHQ9 SCORE ONLY  PHQ-9 Total Score 11 8 8       ASSESSMENT/PLAN:   Depression Patient is ready to start medication.  Will begin with 10 mg Celexa.  If patient tolerates can increase dose at next visit with PCP.  Also offered therapy resources which patient agreed to call and set up an appointment.     Rash: Appears to be dry patch of skin.  No signs of herpetic lesions indicating shingles.  Discussed using Vaseline or Aquaphor twice a day to help with moisturizing.  If not improved consider  steroid cream.  Glendale Chard, DO [facility]

## 2023-03-17 NOTE — Patient Instructions (Signed)
It was great to see you today!   Today we addressed:  Anxiety/Depression: I am prescribing a new medication called Celexa. You should take one pill daily. If you are not having side effects we can increase the dose at your next visit with Dr. Royal Piedra. I am also giving you a list of therapist in the area that are compatible with your insurance. As we discussed the best outcomes for anxiety/depression is a combination of medication and therapy.   Dry skin: the patch on your leg looks like dry skin. I recommend using Vaseline/Aquaphor twice a day. If things are not getting better let Dr. Royal Piedra know.   Future Appointments  Date Time Provider Department Center  03/27/2023  4:10 PM Shelby Mattocks, DO [facility]     Therapy and Counseling Resources Most providers on this list will take Medicaid. Patients with commercial insurance or Medicare should contact their insurance company to get a list of in network providers.  [facility] (takes children) Location 1: [address] [place], [place] [address] Location 2: [address] [place], [place] [address] [phone]   [facility] (spanish speaking therapist available)(habla espanol)(take medicare and medicaid)  [address], [place], [place] [address], [email or web address] [phone]  [facility]:Psychiatry and Counseling [address] [facility] [address] [place], [place] [address] [phone]  [facility]   [address], Suite [place], [place] [address]      [phone]  [facility] (spanish available) [address]  [place], [place] [address] [phone]  [facility] (take [facility] and medicare) [address]  [place], [place] [address]       [phone]     [facility] (virtual only) [phone]  [facility]ov[facility]uss[facility] [address], [place], [place] [phone]  [facility]:  [address]  [place] [phone]  [facility]:  [address] [place] [place] [phone]  [facility] (under & uninsured) [address]   [place] [phone]    [email or web address]    [facility] [address]  [place]    [phone]  [facility] -[address]     Phone:  [phone]     [facility]-  [address]  [phone]   [facility] [address]      [place], [place] [phone]  [facility]: [address] [place], [place], [place]  [phone]   [facility] (Spanish therapist) [email or web address]  [address] [place]  [address]   [place] [address]    [phone]    [facility]   [address],  [place], [place]  [phone]   [facility]  [address] [place]  [phone]  [facility]  [address] [place], [place]        [phone]  Open Access/Walk In Clinic under & uninsured  [facility]  [address] [place], [place] [phone] Crisis [phone]  [facility] [place],  (Spanish)   [address], [place]: [phone]) 8:30 - 12; 1 - 2:30  [facility] [employer],  [address] [place], [place]    [phone]):8:30 - 12; 2 - 3PM  [facility] [employer],  [address]  [place]; [phone]):   Mon -  Fri 8 AM - 5 PM  [facility] [address] [place]  MWF 12:30 to 3:00 or call to schedule an appointment  [phone]  Specific Provider options Psychology Today  [email or web address] click on find a therapist  enter your zip code left side and select or tailor a therapist for your specific need.   [facility] Provider Directory [email or web address]  (Medicaid)    Follow all drop down to find a provider  Social Support program [facility] [phone] or PhotoSolver.pl [address], [place], [place] Recovery support and educational   24- Hour Availability:   [facility]  [address] [place], [place] [phone] Crisis [phone]  [facility] [phone]  [facility] [place] [facility]  [phone]   [facility]  [phone] (after hours)  Therapeutic Alternative/Mobile Crisis   [phone]  [place] National Suicide Hotline  3376507606 Len Childs)  Call 911 or go to emergency room  [facility]  [phone]);  [place] and [employer]  [phone]); [place], [place], [place], [place], [place], [place], [place]   Please arrive 15 minutes before your appointment to ensure smooth check in process.    Please call the clinic at [phone] if your symptoms worsen or you have any concerns.  Thank you for allowing me to participate in your care, Dr. Glendale Chard [facility]

## 2023-03-27 ENCOUNTER — Other Ambulatory Visit (HOSPITAL_COMMUNITY): Payer: Self-pay

## 2023-03-27 ENCOUNTER — Ambulatory Visit: Payer: Medicaid Other | Admitting: Student

## 2023-03-27 NOTE — Progress Notes (Deleted)
  SUBJECTIVE:   CHIEF COMPLAINT / HPI:     PERTINENT  PMH / PSH: HTN, HLD, obesity, OSA on CPAP, T2DM, cardiomegaly, ascending aortic dilation, tobacco use   OBJECTIVE:  LMP 11/21/2012  Physical Exam   ASSESSMENT/PLAN:  There are no diagnoses linked to this encounter. No follow-ups on file. Shelby Mattocks, DO 03/27/2023, 12:40 PM PGY-3, [facility] {    This will disappear when note is signed, click to select method of visit    :1}

## 2023-04-03 NOTE — Progress Notes (Unsigned)
  SUBJECTIVE:   CHIEF COMPLAINT / HPI:   Depression follow-up.  Started on Celexa 10 mg at last visit with Dr. Glendale Chard on 10/14.  PERTINENT  PMH / PSH: HTN, HLD, obesity, OSA on CPAP, T2DM, cardiomegaly, ascending aortic dilation, tobacco use  OBJECTIVE:  BP 127/61   Pulse 73   Ht 5\' 7"  (1.702 m)   Wt (!) 303 lb 8 oz (137.7 kg)   LMP 11/21/2012   SpO2 96%   BMI 47.53 kg/m  Physical Exam   ASSESSMENT/PLAN:   Assessment & Plan Essential hypertension  Housing insecurity  Type 2 diabetes mellitus without complication, without long-term current use of insulin (HCC)  Acute pain of right knee  Encounter for immunization  Return if symptoms worsen or fail to improve. Shelby Mattocks, DO 04/04/2023, 11:27 AM PGY-3,  [facility] {    This will disappear when note is signed, click to select method of visit    :1}

## 2023-04-04 ENCOUNTER — Other Ambulatory Visit (HOSPITAL_COMMUNITY): Payer: Self-pay

## 2023-04-04 ENCOUNTER — Encounter: Payer: Self-pay | Admitting: Student

## 2023-04-04 ENCOUNTER — Ambulatory Visit (INDEPENDENT_AMBULATORY_CARE_PROVIDER_SITE_OTHER): Payer: Medicaid Other | Admitting: Student

## 2023-04-04 VITALS — BP 127/61 | HR 73 | Ht 67.0 in | Wt 303.5 lb

## 2023-04-04 DIAGNOSIS — E119 Type 2 diabetes mellitus without complications: Secondary | ICD-10-CM

## 2023-04-04 DIAGNOSIS — Z419 Encounter for procedure for purposes other than remedying health state, unspecified: Secondary | ICD-10-CM | POA: Diagnosis not present

## 2023-04-04 DIAGNOSIS — Z7985 Long-term (current) use of injectable non-insulin antidiabetic drugs: Secondary | ICD-10-CM

## 2023-04-04 DIAGNOSIS — Z23 Encounter for immunization: Secondary | ICD-10-CM | POA: Diagnosis not present

## 2023-04-04 DIAGNOSIS — M25561 Pain in right knee: Secondary | ICD-10-CM | POA: Diagnosis not present

## 2023-04-04 DIAGNOSIS — Z59819 Housing instability, housed unspecified: Secondary | ICD-10-CM | POA: Diagnosis present

## 2023-04-04 DIAGNOSIS — I10 Essential (primary) hypertension: Secondary | ICD-10-CM | POA: Diagnosis not present

## 2023-04-04 MED ORDER — LOSARTAN POTASSIUM-HCTZ 100-25 MG PO TABS
1.0000 | ORAL_TABLET | Freq: Every day | ORAL | 3 refills | Status: DC
Start: 2023-04-04 — End: 2023-07-17
  Filled 2023-04-04: qty 90, 90d supply, fill #0
  Filled 2023-07-17: qty 90, 90d supply, fill #1

## 2023-04-04 MED ORDER — LOSARTAN POTASSIUM-HCTZ 100-25 MG PO TABS
1.0000 | ORAL_TABLET | Freq: Every day | ORAL | 3 refills | Status: DC
  Filled 2023-04-04: qty 90, 90d supply, fill #0

## 2023-04-04 NOTE — Patient Instructions (Addendum)
It was great to see you today! Thank you for choosing [facility] for your primary care.  Today we addressed: Regarding blood in urine today.  I have refilled your hypertension medication.  I will be thinking of you and her sister regarding her cancer.  Lets continue to watch this right knee pain and if we need to get x-rays and do further management, we can do that at a future visit.  If you haven't already, sign up for My Chart to have easy access to your labs results, and communication with your primary care physician.  Return if symptoms worsen or fail to improve. Please arrive 15 minutes before your appointment to ensure smooth check in process.  We appreciate your efforts in making this happen.  Thank you for allowing me to participate in your care, Shelby Mattocks, DO 04/04/2023, 11:27 AM PGY-3, [facility]

## 2023-04-05 NOTE — Assessment & Plan Note (Signed)
Most likely osteoarthritis given effects of weight over time.  We discussed continuing to work on weight loss.  Continue to monitor, conservative management with Tylenol.  Discussed next steps likely will be x-ray should this continue to bother her and potentially physical therapy or steroid injection should this be osteoarthritis.

## 2023-04-05 NOTE — Assessment & Plan Note (Signed)
Referral placed for housing insecurity.

## 2023-04-05 NOTE — Assessment & Plan Note (Signed)
She is getting weight loss benefits from Trulicity.  Continue with current dosage.  Obtain urine microalbumin/creatinine ratio today.

## 2023-04-05 NOTE — Assessment & Plan Note (Signed)
BP: 127/61 today. Well controlled. Goal of <130/80. Continue to work on healthy dietary habits and exercise. Medication regimen: Continue losartan-HCTZ 100-25 mg daily, amlodipine 5 mg daily, Coreg 37.5 mg twice daily.  Refilled losartan-HCTZ.  Recheck BMP, potentially discontinue potassium.  She has not been taking this.

## 2023-04-06 LAB — BASIC METABOLIC PANEL
BUN/Creatinine Ratio: 14 (ref 9–23)
BUN: 11 mg/dL (ref 6–24)
CO2: 27 mmol/L (ref 20–29)
Calcium: 9.7 mg/dL (ref 8.7–10.2)
Chloride: 101 mmol/L (ref 96–106)
Creatinine, Ser: 0.79 mg/dL (ref 0.57–1.00)
Glucose: 113 mg/dL — ABNORMAL HIGH (ref 70–99)
Potassium: 3.7 mmol/L (ref 3.5–5.2)
Sodium: 146 mmol/L — ABNORMAL HIGH (ref 134–144)
eGFR: 86 mL/min/{1.73_m2} (ref >59)

## 2023-04-06 LAB — MICROALBUMIN / CREATININE URINE RATIO
Creatinine, Urine: 411 mg/dL
Microalb/Creat Ratio: 10 mg/g{creat} (ref 0–29)
Microalbumin, Urine: 40.7 ug/mL

## 2023-04-11 ENCOUNTER — Other Ambulatory Visit: Payer: Self-pay

## 2023-04-11 NOTE — Patient Instructions (Signed)
Visit Information  Ms. Clodfelter was given information about Medicaid Managed Care team care coordination services as a part of their [facility] Medicaid benefit. Shandell Ruhland verbally consented to engagement with the [facility] Managed Care team.   If you are experiencing a medical emergency, please call 911 or report to your local emergency department or urgent care.   If you have a non-emergency medical problem during routine business hours, please contact your provider's office and ask to speak with a nurse.   For questions related to your [facility] health plan, please call: [phone] or go here:[email or web address]  If you would like to schedule transportation through your [facility] plan, please call the following number at least 2 days in advance of your appointment: [phone].   You can also use the MTM portal or MTM mobile app to manage your rides. Reimbursement for transportation is available through [facility]! For the portal, please go to [email or web address]  Call the [facility] Crisis Line at [phone], at any time, 24 hours a day, 7 days a week. If you are in danger or need immediate medical attention call 911.  If you would like help to quit smoking, call 1-800-QUIT-NOW (404-213-6727) OR Espaol: 1-855-Djelo-Ya (4-034-742-5956) o para ms informacin haga clic aqu or Text READY to 387-564 to register via text  Ms. Crivelli - following are the goals we discussed in your visit today:   Goals Addressed   None      Social Worker will follow up in 30 days.   Gus Puma, Kenard Gower, MHA [facility]  Managed Medicaid Social Worker [phone]   Following is a copy of your plan of care:  There are no care plans that you recently modified to display for this patient.

## 2023-04-11 NOTE — Patient Outreach (Signed)
Medicaid Managed Care Social Work Note  04/11/2023 Name:  Virginia Levine MRN:  [id number] DOB:  1965-08-04  Virginia Levine is an 57 y.o. year old female who is a primary patient of Virginia Mattocks, DO.  The [facility] Managed Care Coordination team was consulted for assistance with:   housing   Virginia Levine was given information about Medicaid Managed Care Coordination team services today. Virginia Levine Patient agreed to services and verbal consent obtained.  Engaged with patient  for by telephone forinitial visit in response to referral for case management and/or care coordination services.   Assessments/Interventions:  Review of past medical history, allergies, medications, health status, including review of consultants reports, laboratory and other test data, was performed as part of comprehensive evaluation and provision of chronic care management services.  SDOH: (Social Determinant of Health) assessments and interventions performed: SDOH Interventions    Flowsheet Row Office Visit from 03/17/2023 in [facility] [place]. [facility] Office Visit from 08/30/2021 in [facility] [place]. [facility]  SDOH Interventions    Depression Interventions/Treatment  Medication, Counseling Counseling     BSW completed a telephone outreach with patient she states she is 3 month behind on her rent and has an eviction notice and court on 11/21. Patient states she has tried all resources but no one is able to help. Patient states she owes 4000. BSW provided patient with the telephone number for 2 churches and informed to contact wellcare. 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  Advanced Directives Status:  Not addressed in this encounter.  Care Plan                 Allergies  Allergen Reactions   Lisinopril Other (See Comments)     Angioedema / Hair loss/burned scalp    Medications Reviewed Today   Medications were not reviewed in this encounter     Patient Active Problem List   Diagnosis Date Noted   Housing insecurity 04/04/2023   Right knee pain 04/04/2023   Alcohol use 12/13/2022   Vitamin D deficiency 11/19/2022   Pelvic pain 04/19/2022   Back pain 02/19/2022   Ascending aorta dilation (HCC) 10/27/2021   Depression 08/30/2021   Fatigue 08/30/2021   Hepatic steatosis 08/01/2021   Coronary artery disease involving native coronary artery of native heart without angina pectoris 07/24/2021   Cardiomegaly 07/10/2021   Routine adult health maintenance 07/04/2021   Insomnia 04/13/2020   Flat foot 04/13/2020   Type 2 diabetes mellitus without complications (HCC) 02/14/2020   OSA (obstructive sleep apnea) 12/15/2019   Gastroesophageal reflux disease 03/16/2018   History of abnormal cervical Pap smear 11/26/2016   Other specified abnormal uterine and vaginal bleeding 03/24/2015   Rectal bleed 03/24/2015   Hyperlipidemia 01/11/2015   Obesity 01/21/2013   Tobacco abuse 01/21/2013   Essential hypertension 12/11/2012    Conditions to be addressed/monitored per PCP order:   housing resources  There are no care plans that you recently modified to display for this patient.   Follow up:  Patient agrees to Care Plan and Follow-up.  Plan: The Managed Medicaid care management team will reach out to the patient again over the next 30 days.  Date/time of next scheduled Social Work care management/care coordination outreach:  05/12/23  Virginia Levine, BSW,  MHA [place]  Managed [facility] Social Worker [phone]

## 2023-04-14 ENCOUNTER — Ambulatory Visit: Payer: Medicaid Other

## 2023-04-18 DIAGNOSIS — G4733 Obstructive sleep apnea (adult) (pediatric): Secondary | ICD-10-CM | POA: Diagnosis not present

## 2023-04-22 ENCOUNTER — Other Ambulatory Visit (HOSPITAL_COMMUNITY): Payer: Self-pay

## 2023-05-04 DIAGNOSIS — Z419 Encounter for procedure for purposes other than remedying health state, unspecified: Secondary | ICD-10-CM | POA: Diagnosis not present

## 2023-05-13 ENCOUNTER — Other Ambulatory Visit: Payer: Self-pay

## 2023-05-13 NOTE — Patient Outreach (Signed)
  Medicaid Managed Care   Unsuccessful Outreach Note  05/13/2023 Name: Virginia Levine MRN: [id number] DOB: 05-13-1966  Referred by: Shelby Mattocks, DO Reason for referral : High Risk Managed Medicaid (MM social work unsuccessful telephone outreach )   An unsuccessful telephone outreach was attempted today. The patient was referred to the case management team for assistance with care management and care coordination.   Follow Up Plan: A HIPAA compliant phone message was left for the patient providing contact information and requesting a return call.   Abelino Derrick, MHA [facility]  Managed [facility] Social Worker [phone]

## 2023-05-13 NOTE — Patient Instructions (Signed)
  Medicaid Managed Care   Unsuccessful Outreach Note  05/13/2023 Name: Virginia Levine MRN: [id number] DOB: 05-13-1966  Referred by: Shelby Mattocks, DO Reason for referral : High Risk Managed Medicaid (MM social work unsuccessful telephone outreach )   An unsuccessful telephone outreach was attempted today. The patient was referred to the case management team for assistance with care management and care coordination.   Follow Up Plan: A HIPAA compliant phone message was left for the patient providing contact information and requesting a return call.   Abelino Derrick, MHA [facility]  Managed [facility] Social Worker [phone]

## 2023-05-22 ENCOUNTER — Other Ambulatory Visit (HOSPITAL_COMMUNITY): Payer: Self-pay

## 2023-05-29 ENCOUNTER — Other Ambulatory Visit (HOSPITAL_COMMUNITY): Payer: Self-pay

## 2023-06-02 ENCOUNTER — Telehealth: Payer: Self-pay

## 2023-06-02 NOTE — Progress Notes (Signed)
..   Medicaid Managed Care   Unsuccessful Outreach Note  06/02/2023 Name: Virginia Levine MRN: [id number] DOB: 08-26-1965  Referred by: Shelby Mattocks, DO Reason for referral : High Risk Managed Medicaid (I called the patient today to reschedule her missed phone appointment with the MM BSW. I had to leave a message on her VM.)   A second unsuccessful telephone outreach was attempted today. The patient was referred to the case management team for assistance with care management and care coordination.   Follow Up Plan: A HIPAA compliant phone message was left for the patient providing contact information and requesting a return call.  The care management team will reach out to the patient again over the next 7 days.   Weston Settle Care Guide  [facility] Managed  Care Guide [facility]  [phone]

## 2023-06-04 DIAGNOSIS — Z419 Encounter for procedure for purposes other than remedying health state, unspecified: Secondary | ICD-10-CM | POA: Diagnosis not present

## 2023-06-06 DIAGNOSIS — Z7689 Persons encountering health services in other specified circumstances: Secondary | ICD-10-CM | POA: Diagnosis not present

## 2023-06-09 ENCOUNTER — Ambulatory Visit: Payer: Medicaid Other | Admitting: Podiatry

## 2023-06-09 ENCOUNTER — Telehealth: Payer: Self-pay

## 2023-06-09 NOTE — Progress Notes (Signed)
..   Medicaid Managed Care   Unsuccessful Outreach Note  06/09/2023 Name: Virginia Levine MRN: [id number] DOB: November 14, 1965  Referred by: Orlando Pond, DO Reason for referral : High Risk Managed Medicaid   Third unsuccessful telephone outreach was attempted today. The patient was referred to the case management team for assistance with care management and care coordination. The patient's primary care provider has been notified of our unsuccessful attempts to make or maintain contact with the patient. The care management team is pleased to engage with this patient at any time in the future should he/she be interested in assistance from the care management team.   Follow Up Plan: We have been unable to make contact with the patient for follow up. The care management team is available to follow up with the patient after provider conversation with the patient regarding recommendation for care management engagement and subsequent re-referral to the care management team.   [name] Care Guide  [facility] Managed  Care Guide [facility]  [phone]

## 2023-06-12 ENCOUNTER — Ambulatory Visit: Payer: Medicaid Other | Admitting: Podiatry

## 2023-06-25 ENCOUNTER — Encounter: Payer: Self-pay | Admitting: Podiatry

## 2023-06-25 ENCOUNTER — Ambulatory Visit: Payer: Medicaid Other | Admitting: Podiatry

## 2023-06-25 VITALS — Ht 67.0 in | Wt 303.0 lb

## 2023-06-25 DIAGNOSIS — E119 Type 2 diabetes mellitus without complications: Secondary | ICD-10-CM

## 2023-06-25 DIAGNOSIS — B351 Tinea unguium: Secondary | ICD-10-CM

## 2023-06-25 DIAGNOSIS — M79674 Pain in right toe(s): Secondary | ICD-10-CM | POA: Diagnosis not present

## 2023-06-25 DIAGNOSIS — M79675 Pain in left toe(s): Secondary | ICD-10-CM

## 2023-06-25 DIAGNOSIS — Z7689 Persons encountering health services in other specified circumstances: Secondary | ICD-10-CM | POA: Diagnosis not present

## 2023-06-25 NOTE — Progress Notes (Signed)
This patient returns to my office for at risk foot care.  This patient requires this care by a professional since this patient will be at risk due to having diabetes.  This patient is unable to cut nails herself since the patient cannot reach hiernails.These nails are painful walking and wearing shoes.  This patient presents for at risk foot care today.  General Appearance  Alert, conversant and in no acute stress.  Vascular  Dorsalis pedis and posterior tibial  pulses are palpable  bilaterally.  Capillary return is within normal limits  bilaterally. Temperature is within normal limits  bilaterally.  Neurologic  Senn-Weinstein monofilament wire test within normal limits  bilaterally. Muscle power within normal limits bilaterally.  Nails Thick disfigured discolored nails with subungual debris  from hallux to fifth toes bilaterally. No evidence of bacterial infection or drainage bilaterally.  Orthopedic  No limitations of motion  feet .  No crepitus or effusions noted.  No bony pathology or digital deformities noted.  Skin  normotropic skin with no porokeratosis noted bilaterally.  No signs of infections or ulcers noted.     Onychomycosis  Pain in right toes  Pain in left toes  Consent was obtained for treatment procedures.   Mechanical debridement of nails 1-5  bilaterally performed with a nail nipper.  Filed with dremel without incident. Patient has pincer hallux nails. Prescribe fungus nail medicine.   Return office visit   3 months                   Told patient to return for periodic foot care and evaluation due to potential at risk complications.   Helane Gunther DPM

## 2023-07-04 ENCOUNTER — Ambulatory Visit: Payer: Medicaid Other | Admitting: Student

## 2023-07-04 DIAGNOSIS — Z7689 Persons encountering health services in other specified circumstances: Secondary | ICD-10-CM | POA: Diagnosis not present

## 2023-07-05 DIAGNOSIS — Z419 Encounter for procedure for purposes other than remedying health state, unspecified: Secondary | ICD-10-CM | POA: Diagnosis not present

## 2023-07-16 ENCOUNTER — Other Ambulatory Visit: Payer: Self-pay

## 2023-07-16 ENCOUNTER — Other Ambulatory Visit (HOSPITAL_COMMUNITY): Payer: Self-pay

## 2023-07-16 ENCOUNTER — Other Ambulatory Visit: Payer: Self-pay | Admitting: Student

## 2023-07-16 ENCOUNTER — Other Ambulatory Visit (HOSPITAL_BASED_OUTPATIENT_CLINIC_OR_DEPARTMENT_OTHER): Payer: Self-pay

## 2023-07-16 DIAGNOSIS — E782 Mixed hyperlipidemia: Secondary | ICD-10-CM

## 2023-07-16 DIAGNOSIS — I10 Essential (primary) hypertension: Secondary | ICD-10-CM

## 2023-07-16 DIAGNOSIS — E119 Type 2 diabetes mellitus without complications: Secondary | ICD-10-CM

## 2023-07-16 MED ORDER — ASPIRIN 81 MG PO TBEC
81.0000 mg | DELAYED_RELEASE_TABLET | Freq: Every day | ORAL | 2 refills | Status: DC
  Filled 2023-07-16: qty 90, 90d supply, fill #0
  Filled 2023-12-29: qty 90, 90d supply, fill #1
  Filled 2024-04-22 – 2024-05-10 (×2): qty 90, 90d supply, fill #2

## 2023-07-16 MED ORDER — ATORVASTATIN CALCIUM 40 MG PO TABS
40.0000 mg | ORAL_TABLET | Freq: Every day | ORAL | 3 refills | Status: DC
  Filled 2023-07-16 – 2023-08-19 (×3): qty 90, 90d supply, fill #0
  Filled 2023-11-19: qty 90, 90d supply, fill #1
  Filled 2024-02-17: qty 90, 90d supply, fill #2

## 2023-07-16 MED ORDER — TRULICITY 1.5 MG/0.5ML ~~LOC~~ SOAJ
1.5000 mg | SUBCUTANEOUS | 3 refills | Status: DC
  Filled 2023-07-16: qty 2, 28d supply, fill #0
  Filled 2023-09-01: qty 2, 28d supply, fill #1

## 2023-07-16 MED ORDER — CARVEDILOL 25 MG PO TABS
37.5000 mg | ORAL_TABLET | Freq: Two times a day (BID) | ORAL | 5 refills | Status: DC
  Filled 2023-07-16: qty 180, 60d supply, fill #0
  Filled 2023-09-15: qty 180, 60d supply, fill #1
  Filled 2023-11-19: qty 180, 60d supply, fill #2
  Filled 2024-01-12: qty 180, 60d supply, fill #3
  Filled 2024-03-16: qty 180, 60d supply, fill #4
  Filled 2024-03-22: qty 180, 60d supply, fill #5

## 2023-07-17 ENCOUNTER — Other Ambulatory Visit: Payer: Self-pay

## 2023-07-17 ENCOUNTER — Other Ambulatory Visit (HOSPITAL_COMMUNITY): Payer: Self-pay

## 2023-07-17 ENCOUNTER — Other Ambulatory Visit (HOSPITAL_BASED_OUTPATIENT_CLINIC_OR_DEPARTMENT_OTHER): Payer: Self-pay

## 2023-07-17 ENCOUNTER — Other Ambulatory Visit: Payer: Self-pay | Admitting: Student

## 2023-07-17 DIAGNOSIS — I10 Essential (primary) hypertension: Secondary | ICD-10-CM

## 2023-07-17 MED ORDER — LOSARTAN POTASSIUM-HCTZ 100-25 MG PO TABS
1.0000 | ORAL_TABLET | Freq: Every day | ORAL | 3 refills | Status: AC
  Filled 2023-07-17: qty 90, 90d supply, fill #0
  Filled 2023-10-15: qty 90, 90d supply, fill #1
  Filled 2024-01-12: qty 90, 90d supply, fill #2
  Filled 2024-04-22 – 2024-05-11 (×3): qty 90, 90d supply, fill #3

## 2023-07-17 MED ORDER — AMLODIPINE BESYLATE 5 MG PO TABS
5.0000 mg | ORAL_TABLET | Freq: Every day | ORAL | 3 refills | Status: DC
Start: 2023-07-17 — End: 2023-12-29
  Filled 2023-07-17 – 2023-08-19 (×2): qty 90, 90d supply, fill #0

## 2023-07-18 ENCOUNTER — Ambulatory Visit: Payer: Medicaid Other | Admitting: Student

## 2023-07-21 ENCOUNTER — Telehealth: Payer: Self-pay

## 2023-07-21 NOTE — Telephone Encounter (Signed)
Pharmacy Patient Advocate Encounter   Received notification from CoverMyMeds that prior authorization for TRULICITY is required/requested.   Insurance verification completed.   The patient is insured through  [place]  .   PA required; PA submitted to above mentioned insurance via CoverMyMeds Key/confirmation #/EOC [id number]. Status is pending

## 2023-07-22 ENCOUNTER — Other Ambulatory Visit (HOSPITAL_COMMUNITY): Payer: Self-pay

## 2023-07-22 ENCOUNTER — Other Ambulatory Visit: Payer: Self-pay

## 2023-07-22 MED ORDER — ALBUTEROL SULFATE HFA 108 (90 BASE) MCG/ACT IN AERS
2.0000 | INHALATION_SPRAY | Freq: Four times a day (QID) | RESPIRATORY_TRACT | 2 refills | Status: DC | PRN
  Filled 2023-07-22: qty 18, 25d supply, fill #0
  Filled 2024-01-12: qty 18, 25d supply, fill #1
  Filled 2024-03-16: qty 18, 25d supply, fill #2

## 2023-07-23 NOTE — Telephone Encounter (Signed)
Pharmacy Patient Advocate Encounter  Received notification from  [facility]  that Prior Authorization for TRULICITY has been APPROVED from 07/21/23 to 07/20/24   PA #/Case ID/Reference #: [id number]

## 2023-07-29 ENCOUNTER — Ambulatory Visit: Payer: Medicaid Other | Admitting: Student

## 2023-07-30 DIAGNOSIS — Z7689 Persons encountering health services in other specified circumstances: Secondary | ICD-10-CM | POA: Diagnosis not present

## 2023-07-31 NOTE — Progress Notes (Deleted)
  SUBJECTIVE:   CHIEF COMPLAINT / HPI:   Burning sensation in feet and legs.  PERTINENT  PMH / PSH: HTN, HLD, obesity, OSA on CPAP, T2DM, cardiomegaly, ascending aortic dilation, tobacco use   OBJECTIVE:  LMP 11/21/2012  ***  ASSESSMENT/PLAN:   Assessment & Plan  No follow-ups on file. Shelby Mattocks, DO 07/31/2023, 1:45 PM PGY-3, [facility] {    This will disappear when note is signed, click to select method of visit    :1}

## 2023-08-01 ENCOUNTER — Ambulatory Visit: Payer: Medicaid Other | Admitting: Student

## 2023-08-02 DIAGNOSIS — Z419 Encounter for procedure for purposes other than remedying health state, unspecified: Secondary | ICD-10-CM | POA: Diagnosis not present

## 2023-08-04 NOTE — Progress Notes (Signed)
  SUBJECTIVE:   CHIEF COMPLAINT / HPI:   Smoking cessation: Pack every 3 days. Asking if she can receive nicotine patches. She really wants to quit. She is tired of smoking.  Concerned of sinus infection: burning sensation in her nose and when she blows her nose, sometimes blood comes out: Numbness in right hand bothering her most when sleeping: Burning sensation in feet and legs: Endorses burning and numbness in her legs. She can be sitting on the bus and her feet will get numb. Numbness is every day but not all day. The burning will come and go. It is most common when she sleeps and sitting down. Present for 3 months with low back pain as well. She has sciatica. R>L  PERTINENT  PMH / PSH: HTN, HLD, obesity, OSA on CPAP, T2DM, cardiomegaly, ascending aortic dilation, tobacco use   OBJECTIVE:  BP 117/80   Pulse 78   Ht 5\' 7"  (1.702 m)   Wt (!) 303 lb (137.4 kg)   LMP 11/21/2012   SpO2 94%   BMI 47.46 kg/m  General: Well-appearing, NAD HEENT: Erythematous nasal turbinates without evidence of bleeding Neuro:  RUE: 5/5 strength of grip, no appreciable lack of sensation, Tinel's negative, Phalen's negative BLEs: 5/5 strength in all ROM, no loss of sensation in all nerve roots  ASSESSMENT/PLAN:   Assessment & Plan Neuropathy Do not suspect diabetic neuropathy given she has only recent diagnosis of diabetes. Considered carpal or cubital tunnel for hand although physical exam and history not consistent with this.  Cervical myelopathy is also highly unlikely. Will obtain labs for iron, anemia, vitamin levels.  Trial gabapentin 100 mg 3 times daily.  She had prior vitamin D level with significant deficiency, may need to replace again.   Mixed hyperlipidemia Continue atorvastatin 40 mg daily.  Recheck lipid panel. Tobacco abuse 1 pack every 3 days, nicotine patch 14 mg daily.  Reassess at next visit. Type 2 diabetes mellitus without complication, without long-term current use of insulin  (HCC) Recheck A1c.  Continue Trulicity 1.5 mg weekly. 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  Trial Flonase.  Recommend more humid environment if able. Return in about 2 weeks (around 08/19/2023) for Follow-up. Shelby Mattocks, DO 08/05/2023, 1:15 PM PGY-3, [facility]

## 2023-08-05 ENCOUNTER — Ambulatory Visit (INDEPENDENT_AMBULATORY_CARE_PROVIDER_SITE_OTHER): Payer: Medicaid Other | Admitting: Student

## 2023-08-05 ENCOUNTER — Other Ambulatory Visit (HOSPITAL_COMMUNITY): Payer: Self-pay

## 2023-08-05 VITALS — BP 117/80 | HR 78 | Ht 67.0 in | Wt 303.0 lb

## 2023-08-05 DIAGNOSIS — E559 Vitamin D deficiency, unspecified: Secondary | ICD-10-CM

## 2023-08-05 DIAGNOSIS — Z720 Tobacco use: Secondary | ICD-10-CM | POA: Diagnosis not present

## 2023-08-05 DIAGNOSIS — J00 Acute nasopharyngitis [common cold]: Secondary | ICD-10-CM

## 2023-08-05 DIAGNOSIS — E782 Mixed hyperlipidemia: Secondary | ICD-10-CM

## 2023-08-05 DIAGNOSIS — E119 Type 2 diabetes mellitus without complications: Secondary | ICD-10-CM

## 2023-08-05 DIAGNOSIS — Z7985 Long-term (current) use of injectable non-insulin antidiabetic drugs: Secondary | ICD-10-CM | POA: Diagnosis not present

## 2023-08-05 DIAGNOSIS — G629 Polyneuropathy, unspecified: Secondary | ICD-10-CM

## 2023-08-05 DIAGNOSIS — Z7689 Persons encountering health services in other specified circumstances: Secondary | ICD-10-CM | POA: Diagnosis not present

## 2023-08-05 LAB — POCT GLYCOSYLATED HEMOGLOBIN (HGB A1C): HbA1c, POC (controlled diabetic range): 6.5 % (ref 0.0–7.0)

## 2023-08-05 MED ORDER — GABAPENTIN 100 MG PO CAPS
100.0000 mg | ORAL_CAPSULE | Freq: Three times a day (TID) | ORAL | 3 refills | Status: DC
Start: 2023-08-05 — End: 2023-10-03
  Filled 2023-08-05 – 2023-08-06 (×2): qty 90, 30d supply, fill #0

## 2023-08-05 MED ORDER — NICOTINE 14 MG/24HR TD PT24
14.0000 mg | MEDICATED_PATCH | Freq: Every day | TRANSDERMAL | 0 refills | Status: DC
Start: 2023-08-05 — End: 2023-10-20
  Filled 2023-08-05 – 2023-08-06 (×2): qty 28, 28d supply, fill #0

## 2023-08-05 MED ORDER — FLUTICASONE PROPIONATE 50 MCG/ACT NA SUSP
1.0000 | Freq: Every day | NASAL | 12 refills | Status: AC
  Filled 2023-08-05 – 2023-08-06 (×2): qty 16, 60d supply, fill #0

## 2023-08-05 NOTE — Assessment & Plan Note (Signed)
 1 pack every 3 days, nicotine patch 14 mg daily.  Reassess at next visit.

## 2023-08-05 NOTE — Patient Instructions (Addendum)
 It was great to see you today! Thank you for choosing [facility] for your primary care.  Today we addressed: Burning in hands and feet: Gabapentin 100 mg 3 times daily.  Will check labs including vitamin B and D.  Return in 2 weeks. For your nose irritation, you need a more humid environment.  Humidifier at night in addition to wearing your CPAP may be beneficial.  I also recommend using Flonase daily. Smoking cessation: Nicotine patch daily.  We can discuss in 2 weeks if this was beneficial or if you need a change dosage.  If you haven't already, sign up for My Chart to have easy access to your labs results, and communication with your primary care physician. We are checking some labs today. If they are abnormal, I will call you. If they are normal, I will send you a MyChart message (if it is active) or a letter in the mail. If you do not hear about your labs in the next 2 weeks, please call the office. Return in about 2 weeks (around 08/19/2023) for Follow-up. Please arrive 15 minutes before your appointment to ensure smooth check in process.  We appreciate your efforts in making this happen.  Thank you for allowing me to participate in your care, Virginia Mattocks, DO 08/05/2023, 10:52 AM PGY-3, [facility]

## 2023-08-05 NOTE — Assessment & Plan Note (Signed)
 Recheck A1c.  Continue Trulicity 1.5 mg weekly.

## 2023-08-05 NOTE — Assessment & Plan Note (Signed)
 Continue atorvastatin 40 mg daily.   Recheck lipid panel.

## 2023-08-06 ENCOUNTER — Other Ambulatory Visit (HOSPITAL_COMMUNITY): Payer: Self-pay

## 2023-08-07 ENCOUNTER — Encounter: Payer: Self-pay | Admitting: Student

## 2023-08-07 ENCOUNTER — Other Ambulatory Visit (HOSPITAL_COMMUNITY): Payer: Self-pay

## 2023-08-07 ENCOUNTER — Other Ambulatory Visit: Payer: Self-pay | Admitting: Student

## 2023-08-07 DIAGNOSIS — E559 Vitamin D deficiency, unspecified: Secondary | ICD-10-CM

## 2023-08-07 LAB — COMPREHENSIVE METABOLIC PANEL
ALT: 19 IU/L (ref 0–32)
AST: 20 IU/L (ref 0–40)
Albumin: 4.4 g/dL (ref 3.8–4.9)
Alkaline Phosphatase: 116 IU/L (ref 44–121)
BUN/Creatinine Ratio: 14 (ref 12–28)
BUN: 10 mg/dL (ref 8–27)
Bilirubin Total: 1.4 mg/dL — ABNORMAL HIGH (ref 0.0–1.2)
CO2: 26 mmol/L (ref 20–29)
Calcium: 10 mg/dL (ref 8.7–10.3)
Chloride: 101 mmol/L (ref 96–106)
Creatinine, Ser: 0.72 mg/dL (ref 0.57–1.00)
Globulin, Total: 2.7 g/dL (ref 1.5–4.5)
Glucose: 105 mg/dL — ABNORMAL HIGH (ref 70–99)
Potassium: 3.9 mmol/L (ref 3.5–5.2)
Sodium: 142 mmol/L (ref 134–144)
Total Protein: 7.1 g/dL (ref 6.0–8.5)
eGFR: 96 mL/min/{1.73_m2} (ref >59)

## 2023-08-07 LAB — ANEMIA PANEL
Ferritin: 384 ng/mL — ABNORMAL HIGH (ref 15–150)
Folate, Hemolysate: 281 ng/mL
Folate, RBC: 694 ng/mL (ref >498)
Hematocrit: 40.5 % (ref 34.0–46.6)
Iron Saturation: 24 % (ref 15–55)
Iron: 83 ug/dL (ref 27–159)
Retic Ct Pct: 1.5 % (ref 0.6–2.6)
Total Iron Binding Capacity: 345 ug/dL (ref 250–450)
UIBC: 262 ug/dL (ref 131–425)
Vitamin B-12: 396 pg/mL (ref 232–1245)

## 2023-08-07 LAB — CBC
Hemoglobin: 13.9 g/dL (ref 11.1–15.9)
MCH: 32.5 pg (ref 26.6–33.0)
MCHC: 34.3 g/dL (ref 31.5–35.7)
MCV: 95 fL (ref 79–97)
Platelets: 220 10*3/uL (ref 150–450)
RBC: 4.28 x10E6/uL (ref 3.77–5.28)
RDW: 11.9 % (ref 11.7–15.4)
WBC: 8.8 10*3/uL (ref 3.4–10.8)

## 2023-08-07 LAB — LIPID PANEL
Chol/HDL Ratio: 3.6 ratio (ref 0.0–4.4)
Cholesterol, Total: 138 mg/dL (ref 100–199)
HDL: 38 mg/dL — ABNORMAL LOW (ref >39)
LDL Chol Calc (NIH): 73 mg/dL (ref 0–99)
Triglycerides: 159 mg/dL — ABNORMAL HIGH (ref 0–149)
VLDL Cholesterol Cal: 27 mg/dL (ref 5–40)

## 2023-08-07 LAB — VITAMIN D 25 HYDROXY (VIT D DEFICIENCY, FRACTURES): Vit D, 25-Hydroxy: 9.7 ng/mL — ABNORMAL LOW (ref 30.0–100.0)

## 2023-08-07 MED ORDER — VITAMIN D (ERGOCALCIFEROL) 1.25 MG (50000 UNIT) PO CAPS
50000.0000 [IU] | ORAL_CAPSULE | ORAL | 0 refills | Status: AC
Start: 2023-08-07 — End: ?
  Filled 2023-08-07 – 2023-08-13 (×2): qty 8, 56d supply, fill #0

## 2023-08-11 ENCOUNTER — Other Ambulatory Visit (HOSPITAL_COMMUNITY): Payer: Self-pay

## 2023-08-13 ENCOUNTER — Other Ambulatory Visit (HOSPITAL_COMMUNITY): Payer: Self-pay

## 2023-08-13 ENCOUNTER — Other Ambulatory Visit: Payer: Self-pay

## 2023-08-13 ENCOUNTER — Other Ambulatory Visit (HOSPITAL_BASED_OUTPATIENT_CLINIC_OR_DEPARTMENT_OTHER): Payer: Self-pay

## 2023-08-14 ENCOUNTER — Other Ambulatory Visit (HOSPITAL_COMMUNITY): Payer: Self-pay

## 2023-08-15 DIAGNOSIS — Z7689 Persons encountering health services in other specified circumstances: Secondary | ICD-10-CM | POA: Diagnosis not present

## 2023-08-19 ENCOUNTER — Other Ambulatory Visit (HOSPITAL_COMMUNITY): Payer: Self-pay

## 2023-08-26 ENCOUNTER — Ambulatory Visit: Admitting: Student

## 2023-08-27 ENCOUNTER — Encounter: Payer: Self-pay | Admitting: Student

## 2023-08-27 ENCOUNTER — Ambulatory Visit: Admitting: Student

## 2023-08-27 VITALS — BP 125/71 | HR 83 | Ht 67.0 in | Wt 299.0 lb

## 2023-08-27 DIAGNOSIS — E1140 Type 2 diabetes mellitus with diabetic neuropathy, unspecified: Secondary | ICD-10-CM | POA: Insufficient documentation

## 2023-08-27 DIAGNOSIS — M79605 Pain in left leg: Secondary | ICD-10-CM | POA: Diagnosis not present

## 2023-08-27 DIAGNOSIS — Z7689 Persons encountering health services in other specified circumstances: Secondary | ICD-10-CM | POA: Diagnosis not present

## 2023-08-27 DIAGNOSIS — Z1211 Encounter for screening for malignant neoplasm of colon: Secondary | ICD-10-CM

## 2023-08-27 DIAGNOSIS — M79604 Pain in right leg: Secondary | ICD-10-CM

## 2023-08-27 NOTE — Patient Instructions (Addendum)
 It was great to see you today! Thank you for choosing [facility] for your primary care.  Today we addressed: Will try to get some imaging.  I would also like to get you up-to-date with your colorectal cancer screening.  I have placed an order for xrays.  Please go to [facility] at [employer] to have this completed.  You do not need an appointment, but if you would like to call them beforehand, their number is [phone].  We will contact you with your results afterwards.   If you haven't already, sign up for My Chart to have easy access to your labs results, and communication with your primary care physician.  Return in about 2 weeks (around 09/10/2023) for Leg and hand pain follow-up. Please arrive 15 minutes before your appointment to ensure smooth check in process.  We appreciate your efforts in making this happen.  Thank you for allowing me to participate in your care, Virginia Mattocks, DO 08/27/2023, 9:59 AM PGY-3, [facility]

## 2023-08-27 NOTE — Progress Notes (Unsigned)
  SUBJECTIVE:   CHIEF COMPLAINT / HPI:   The patient, with a history of carpal tunnel syndrome, presents with worsening bilateral hand and leg pain that has been ongoing for approximately six months. The pain in the hands is described as encompassing the entire hand, front and back, extending up to the wrist. The leg pain starts in the thighs and extends down to the ankles, with associated burning and numbness in the feet. The right hand and leg are reported to be more painful than the left. The pain in the left leg is primarily from the knee down. The patient denies any injury or trauma preceding the onset of the pain. She also reports occasional neck pain, particularly when she does not take her medication.  The patient was recently started on gabapentin for the pain, but reports that the pain has worsened since starting the medication, to the point of waking her up from sleep due to the intensity. The patient has since stopped taking the gabapentin. She also reports numbness in her feet, particularly after sitting for extended periods, which affects her balance and ability to feel the floor beneath her initially upon standing. The patient also reports a band-like pain in the lumbar area and occasional pain in the lower back. Her bed is approximately 60 years old.  PERTINENT  PMH / PSH: HTN, HLD, obesity, OSA on CPAP, T2DM, cardiomegaly, ascending aortic dilation, tobacco use   OBJECTIVE:  BP 125/71   Pulse 83   Ht 5\' 7"  (1.702 m)   Wt 299 lb (135.6 kg)   LMP 11/21/2012   SpO2 98%   BMI 46.83 kg/m  MUSCULOSKELETAL: Hip, knee flexion/extension strength 5/5, full ROM. Sensation intact. Lumbar spine flexion limited with tightness not pain.  No midline tenderness of spine.  ASSESSMENT/PLAN:   Assessment & Plan Leg pain, bilateral Unclear cause. Chronic. Suspect secondary to fibromyalgia vs rheumatologic cause***. Reviewed prior imaging, last performed in 2023.  Screen for colon  cancer Referral to GI for colonoscopy. Hx of rectal bleeding ***  Return in about 2 weeks (around 09/10/2023) for Leg and hand pain follow-up. Shelby Mattocks, DO 08/27/2023, 12:28 PM PGY-3, [facility] {    This will disappear when note is signed, click to select method of visit    :1}

## 2023-08-27 NOTE — Assessment & Plan Note (Signed)
>>  ASSESSMENT AND PLAN FOR LEG PAIN, BILATERAL WRITTEN ON 08/28/2023  9:14 AM BY Jermanie Minshall, DO  Unclear cause. Chronic. Pattern of pain and timing of event exacerbation does not fit classic diagnosis. Suspect secondary to chronic MSK pain vs fibromyalgia vs rheumatologic cause. Reviewed prior imaging, last performed in 2023. Obtain C-spine and L-spine XR. Consider rheumatologic workup next should imaging yield no results.

## 2023-08-27 NOTE — Assessment & Plan Note (Addendum)
 Unclear cause. Chronic. Suspect secondary to fibromyalgia vs rheumatologic cause***. Reviewed prior imaging, last performed in 2023.

## 2023-09-01 ENCOUNTER — Other Ambulatory Visit (HOSPITAL_COMMUNITY): Payer: Self-pay

## 2023-09-03 ENCOUNTER — Ambulatory Visit
Admission: RE | Admit: 2023-09-03 | Discharge: 2023-09-03 | Disposition: A | Discharge status: Home / Self Care | Source: Ambulatory Visit | Attending: Family Medicine

## 2023-09-03 DIAGNOSIS — M542 Cervicalgia: Secondary | ICD-10-CM | POA: Diagnosis not present

## 2023-09-03 DIAGNOSIS — M545 Low back pain, unspecified: Secondary | ICD-10-CM | POA: Diagnosis not present

## 2023-09-03 DIAGNOSIS — M79604 Pain in right leg: Secondary | ICD-10-CM

## 2023-09-03 DIAGNOSIS — Z7689 Persons encountering health services in other specified circumstances: Secondary | ICD-10-CM | POA: Diagnosis not present

## 2023-09-05 ENCOUNTER — Encounter: Payer: Self-pay | Admitting: Student

## 2023-09-10 DIAGNOSIS — Z7689 Persons encountering health services in other specified circumstances: Secondary | ICD-10-CM | POA: Diagnosis not present

## 2023-09-13 DIAGNOSIS — Z419 Encounter for procedure for purposes other than remedying health state, unspecified: Secondary | ICD-10-CM | POA: Diagnosis not present

## 2023-09-15 ENCOUNTER — Other Ambulatory Visit (HOSPITAL_COMMUNITY): Payer: Self-pay

## 2023-09-16 ENCOUNTER — Encounter: Payer: Self-pay | Admitting: Gastroenterology

## 2023-09-18 ENCOUNTER — Other Ambulatory Visit: Payer: Self-pay

## 2023-09-18 ENCOUNTER — Other Ambulatory Visit (HOSPITAL_COMMUNITY): Payer: Self-pay

## 2023-09-18 ENCOUNTER — Ambulatory Visit (AMBULATORY_SURGERY_CENTER): Admitting: *Deleted

## 2023-09-18 VITALS — Ht 67.0 in | Wt 295.0 lb

## 2023-09-18 DIAGNOSIS — Z1211 Encounter for screening for malignant neoplasm of colon: Secondary | ICD-10-CM

## 2023-09-18 MED ORDER — NA SULFATE-K SULFATE-MG SULF 17.5-3.13-1.6 GM/177ML PO SOLN
1.0000 | Freq: Once | ORAL | 0 refills | Status: AC
  Filled 2023-09-18: qty 354, 1d supply, fill #0

## 2023-09-18 NOTE — Progress Notes (Signed)
 Pre visit conducted over telephone. Confirmed patient has active MyChart and has no issues accessing information. Patient voiced understanding to hold Trulicity 7 days prior to colonoscopy.   No egg or soy allergy known to patient  No issues known to pt with past sedation with any surgeries or procedures Patient denies ever being told they had issues or difficulty with intubation  No FH of Malignant Hyperthermia Pt is not on diet pills Pt is not on  home 02  Pt is not on blood thinners  Pt denies issues with constipation  No A fib or A flutter Have any cardiac testing pending-- NO Pt instructed to use Singlecare.com or GoodRx for a price reduction on prep

## 2023-09-22 ENCOUNTER — Ambulatory Visit: Admitting: Student

## 2023-09-22 NOTE — Progress Notes (Deleted)
  SUBJECTIVE:   CHIEF COMPLAINT / HPI:   ***  PERTINENT  PMH / PSH: HTN, HLD, obesity, OSA on CPAP, T2DM, cardiomegaly, ascending aortic dilation, tobacco use   OBJECTIVE:  LMP 11/21/2012  ***  ASSESSMENT/PLAN:   Assessment & Plan  No follow-ups on file. Veronia Goon, DO 09/22/2023, 8:04 AM PGY-3, [facility] {    This will disappear when note is signed, click to select method of visit    :1}

## 2023-10-02 ENCOUNTER — Telehealth: Payer: Self-pay | Admitting: Gastroenterology

## 2023-10-02 NOTE — Telephone Encounter (Signed)
 Patient called asking what her diet should be today or tomorrow. She works overnight and has been resting over the course of the day. She had a small [employer] bar early this morning but nothing for lunch and nothing for dinner at this point. Told patient she wil need to remain on clear liquid diet the rest of today into tomorrow. She may have clear liquids up until 3 hours before her planned procedure. She may take any medications that she needs with small sips of water. She will follow the preparation instructions later today and tomorrow morning. She was appreciative for the call back.    Yong Henle, MD [facility] Office # [phone]

## 2023-10-03 ENCOUNTER — Encounter: Payer: Self-pay | Admitting: Gastroenterology

## 2023-10-03 ENCOUNTER — Ambulatory Visit (AMBULATORY_SURGERY_CENTER): Admitting: Gastroenterology

## 2023-10-03 VITALS — BP 145/77 | HR 70 | Temp 98.2°F | Resp 19 | Ht 67.0 in | Wt 295.0 lb

## 2023-10-03 DIAGNOSIS — K648 Other hemorrhoids: Secondary | ICD-10-CM | POA: Diagnosis not present

## 2023-10-03 DIAGNOSIS — D128 Benign neoplasm of rectum: Secondary | ICD-10-CM

## 2023-10-03 DIAGNOSIS — I2510 Atherosclerotic heart disease of native coronary artery without angina pectoris: Secondary | ICD-10-CM | POA: Diagnosis not present

## 2023-10-03 DIAGNOSIS — D122 Benign neoplasm of ascending colon: Secondary | ICD-10-CM

## 2023-10-03 DIAGNOSIS — K635 Polyp of colon: Secondary | ICD-10-CM | POA: Diagnosis not present

## 2023-10-03 DIAGNOSIS — K5730 Diverticulosis of large intestine without perforation or abscess without bleeding: Secondary | ICD-10-CM | POA: Diagnosis not present

## 2023-10-03 DIAGNOSIS — Z1211 Encounter for screening for malignant neoplasm of colon: Secondary | ICD-10-CM | POA: Diagnosis not present

## 2023-10-03 DIAGNOSIS — E119 Type 2 diabetes mellitus without complications: Secondary | ICD-10-CM | POA: Diagnosis not present

## 2023-10-03 DIAGNOSIS — R7303 Prediabetes: Secondary | ICD-10-CM | POA: Diagnosis not present

## 2023-10-03 DIAGNOSIS — G4733 Obstructive sleep apnea (adult) (pediatric): Secondary | ICD-10-CM | POA: Diagnosis not present

## 2023-10-03 MED ORDER — SODIUM CHLORIDE 0.9 % IV SOLN: 500.0000 mL | Freq: Once | INTRAVENOUS | Status: AC

## 2023-10-03 NOTE — Progress Notes (Signed)
 History and Physical:  This patient presents for endoscopic testing for: Encounter Diagnosis  Name Primary?   Special screening for malignant neoplasms, colon Yes    Average risk for colorectal cancer.  2nd screening exam.  Last colonoscopy Apr 26, 2015 at outside clinic.(Cecum reached - prep quality no indicated in text of report reviewed in EHR.  Patient is otherwise without complaints or active issues today.   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   Echocardiogram 10/2021: Ascending aorta 39 mm   Cardiomegaly 07/10/2021   Echocardiogram 10/2021: EF 60-65, no RWMA, mod LVH, Gr 1 DD, normal RVSF, normal PASP, severe LAE, mild MR, ascending aorta 39 mm   Coronary artery disease 07/24/2021   CCTA 2/23: CAC score 601 (99th percentile); pLAD 25-49, mLAD 1-24, pD1 25-49, pLCx 25-49, pOM1 1-24; FFR negative; Asc Aorta 3.6 cm; hepatic steatosis    Diabetes mellitus without complication (HCC)    Hyperlipidemia    Hypertension    Obesity    OSA (obstructive sleep apnea)    Pre-diabetes    Sleep apnea    Tobacco use    Vitamin D  deficiency 11/19/2022     Past Surgical History: Past Surgical History:  Procedure Laterality Date   LEEP  2018    Allergies: Allergies  Allergen Reactions   Lisinopril  Other (See Comments)    Angioedema / Hair loss/burned scalp    Outpatient Meds: Current Outpatient Medications  Medication Sig Dispense Refill   amLODipine  (NORVASC ) 5 MG tablet Take 1 tablet (5 mg total) by mouth at bedtime. 90 tablet 3   aspirin  EC 81 MG tablet Take 1 tablet (81 mg total) by mouth daily. 90 tablet 2   atorvastatin  (LIPITOR) 40 MG tablet Take 1 tablet (40 mg total) by mouth daily. 90 tablet 3   carvedilol  (COREG ) 25 MG tablet Take 1.5 tablets (37.5 mg total) by mouth 2 (two) times daily with a meal. 180 tablet 5   fluticasone  (FLONASE ) 50 MCG/ACT nasal spray Place 1 spray into both nostrils daily. 16  g 12   losartan -hydrochlorothiazide  (HYZAAR ) 100-25 MG tablet Take 1 tablet by mouth daily. 90 tablet 3   Vitamin D , Ergocalciferol , (DRISDOL ) 1.25 MG (50000 UNIT) CAPS capsule Take 1 capsule (50,000 Units total) by mouth every 7 (seven) days. 8 capsule 0   albuterol  (VENTOLIN  HFA) 108 (90 Base) MCG/ACT inhaler Inhale 2 puffs into the lungs every 6 (six) hours as needed for wheezing or shortness of breath. 18 g 2   Dulaglutide  (TRULICITY ) 1.5 MG/0.5ML SOAJ Inject 1.5 mg into the skin once a week. 2 mL 3   nicotine  (NICODERM CQ  - DOSED IN MG/24 HOURS) 14 mg/24hr patch Place 1 patch (14 mg total) onto the skin daily. (Patient not taking: Reported on 09/18/2023) 28 patch 0   Current Facility-Administered Medications  Medication Dose Route Frequency Provider Last Rate Last Admin   0.9 %  sodium chloride infusion  500 mL Intravenous Once Danis, Cordelia Dessert, MD          ___________________________________________________________________ Objective   Exam:  BP (!) 143/80   Pulse 80   Temp 98.2 F (36.8 C) (Skin)   Ht 5\' 7"  (1.702 m)   Wt 295 lb (133.8 kg)   LMP 11/21/2012   SpO2 96%   BMI 46.20 kg/m   CV: regular , S1/S2 Resp: clear to auscultation bilaterally, normal RR and effort noted GI: soft, no tenderness, with active bowel sounds.  Assessment: Encounter Diagnosis  Name Primary?   Special screening for malignant neoplasms, colon Yes     Plan: Colonoscopy   The benefits and risks of the planned procedure(s) were described in detail with the patient or (when appropriate) their health care proxy.  Risks were outlined as including, but not limited to, bleeding, infection, perforation, adverse medication reaction leading to cardiac or pulmonary decompensation, pancreatitis (if ERCP).  The limitation of incomplete mucosal visualization was also discussed.  No guarantees or warranties were given.  The patient is appropriate for an endoscopic procedure in the ambulatory  setting.   - Lorella Roles, MD

## 2023-10-03 NOTE — Progress Notes (Signed)
 Pt's states no medical or surgical changes since previsit or office visit.

## 2023-10-03 NOTE — Progress Notes (Signed)
 Called to room to assist during endoscopic procedure.  Patient ID and intended procedure confirmed with present staff. Received instructions for my participation in the procedure from the performing physician.

## 2023-10-03 NOTE — Op Note (Signed)
 [facility] Patient Name: Virginia Levine Procedure Date: 10/03/2023 1:28 PM MRN: [id number] Endoscopist: Ace Abu L. Dominic Friendly , MD, [id number] Age: 58 Referring MD:  Date of Birth: 1966/05/06 Gender: Female Account #: [id number] Procedure:                Colonoscopy Indications:              Screening for colorectal malignant neoplasm                           last colonoscopy November 2016 at outside GI clinic                            (complete exam to cecum, prep quality not indicated                            in the report) Medicines:                Monitored Anesthesia Care Procedure:                Pre-Anesthesia Assessment:                           - Prior to the procedure, a History and Physical                            was performed, and patient medications and                            allergies were reviewed. The patient's tolerance of                            previous anesthesia was also reviewed. The risks                            and benefits of the procedure and the sedation                            options and risks were discussed with the patient.                            All questions were answered, and informed consent                            was obtained. Prior Anticoagulants: The patient has                            taken no anticoagulant or antiplatelet agents. ASA                            Grade Assessment: III - A patient with severe                            systemic disease. After reviewing the risks and  benefits, the patient was deemed in satisfactory                            condition to undergo the procedure.                           After obtaining informed consent, the colonoscope                            was passed under direct vision. Throughout the                            procedure, the patient's blood pressure, pulse, and                            oxygen  saturations were monitored  continuously. The                            Olympus Scope [id number] was introduced through the                            anus and advanced to the the cecum, identified by                            appendiceal orifice and ileocecal valve. The                            colonoscopy was performed without difficulty. The                            patient tolerated the procedure well. The quality                            of the bowel preparation was excellent. The                            ileocecal valve, appendiceal orifice, and rectum                            were photographed. The bowel preparation used was                            GoLYTELY. Scope In: 1:57:37 PM Scope Out: 2:17:17 PM Scope Withdrawal Time: 0 hours 17 minutes 21 seconds  Total Procedure Duration: 0 hours 19 minutes 40 seconds  Findings:                 The perianal and digital rectal examinations were                            normal.                           Repeat examination of right colon under NBI  performed.                           An 8 mm polyp was found in the ascending colon. The                            polyp was pedunculated. The polyp was removed with                            a hot snare. Resection and retrieval were complete.                           A diminutive polyp was found in the mid rectum                            (anterior wall). The polyp was sessile. The polyp                            was removed with a cold snare. Resection and                            retrieval were complete. Post polypectomy using was                            then treated with brief snare tip cautery.                           Diverticula were found in the left colon and right                            colon.                           Internal hemorrhoids were found.                           The exam was otherwise without abnormality on                            direct and  retroflexion views. Complications:            No immediate complications. Estimated Blood Loss:     Estimated blood loss was minimal. Impression:               - One 8 mm polyp in the ascending colon, removed                            with a hot snare. Resected and retrieved.                           - One diminutive polyp in the mid rectum, removed                            with a cold snare. Resected and retrieved.                           -  Diverticulosis in the left colon and in the right                            colon.                           - Internal hemorrhoids.                           - The examination was otherwise normal on direct                            and retroflexion views. Recommendation:           - Patient has a contact number available for                            emergencies. The signs and symptoms of potential                            delayed complications were discussed with the                            patient. Return to normal activities tomorrow.                            Written discharge instructions were provided to the                            patient.                           - Resume previous diet.                           - Continue present medications.                           - Await pathology results.                           - Repeat colonoscopy is recommended for                            surveillance. The colonoscopy date will be                            determined after pathology results from today's                            exam become available for review. Nik Gorrell L. Dominic Friendly, MD 10/03/2023 2:22:08 PM This report has been signed electronically.

## 2023-10-03 NOTE — Patient Instructions (Addendum)
    Handouts on polyps,diverticulosis,& hemorrhoids given to you today.   Await pathology results on polyps removed     YOU HAD AN ENDOSCOPIC PROCEDURE TODAY AT THE [facility]:   Refer to the procedure report that was given to you for any specific questions about what was found during the examination.  If the procedure report does not answer your questions, please call your gastroenterologist to clarify.  If you requested that your care partner not be given the details of your procedure findings, then the procedure report has been included in a sealed envelope for you to review at your convenience later.  YOU SHOULD EXPECT: Some feelings of bloating in the abdomen. Passage of more gas than usual.  Walking can help get rid of the air that was put into your GI tract during the procedure and reduce the bloating. If you had a lower endoscopy (such as a colonoscopy or flexible sigmoidoscopy) you may notice spotting of blood in your stool or on the toilet paper. If you underwent a bowel prep for your procedure, you may not have a normal bowel movement for a few days.  Please Note:  You might notice some irritation and congestion in your nose or some drainage.  This is from the oxygen  used during your procedure.  There is no need for concern and it should clear up in a day or so.  SYMPTOMS TO REPORT IMMEDIATELY:  Following lower endoscopy (colonoscopy or flexible sigmoidoscopy):  Excessive amounts of blood in the stool  Significant tenderness or worsening of abdominal pains  Swelling of the abdomen that is new, acute  Fever of 100F or higher    For urgent or emergent issues, a gastroenterologist can be reached at any hour by calling [phone]. Do not use MyChart messaging for urgent concerns.    DIET:  We do recommend a small meal at first, but then you may proceed to your regular diet.  Drink plenty of fluids but you should avoid alcoholic beverages for 24  hours.  ACTIVITY:  You should plan to take it easy for the rest of today and you should NOT DRIVE or use heavy machinery until tomorrow (because of the sedation medicines used during the test).    FOLLOW UP: Our staff will call the number listed on your records the next business day following your procedure.  We will call around 7:15- 8:00 am to check on you and address any questions or concerns that you may have regarding the information given to you following your procedure. If we do not reach you, we will leave a message.     If any biopsies were taken you will be contacted by phone or by letter within the next 1-3 weeks.  Please call us  at [phone] if you have not heard about the biopsies in 3 weeks.    SIGNATURES/CONFIDENTIALITY: You and/or your care partner have signed paperwork which will be entered into your electronic medical record.  These signatures attest to the fact that that the information above on your After Visit Summary has been reviewed and is understood.  Full responsibility of the confidentiality of this discharge information lies with you and/or your care-partner.Continue previous diet & medications

## 2023-10-03 NOTE — Progress Notes (Signed)
 Report to PACU, RN, vss, BBS= Clear.

## 2023-10-06 ENCOUNTER — Telehealth: Payer: Self-pay

## 2023-10-06 NOTE — Telephone Encounter (Signed)
 Attempted f/u call. No answer, left VM.

## 2023-10-08 LAB — SURGICAL PATHOLOGY

## 2023-10-13 ENCOUNTER — Ambulatory Visit: Admitting: Student

## 2023-10-13 DIAGNOSIS — Z419 Encounter for procedure for purposes other than remedying health state, unspecified: Secondary | ICD-10-CM | POA: Diagnosis not present

## 2023-10-13 NOTE — Progress Notes (Deleted)
  SUBJECTIVE:   CHIEF COMPLAINT / HPI:   Bilateral leg pain follow-up:  PERTINENT  PMH / PSH: HTN, HLD, obesity, OSA on CPAP, T2DM, cardiomegaly, ascending aortic dilation, tobacco use   OBJECTIVE:  LMP 11/21/2012  ***  ASSESSMENT/PLAN:   Assessment & Plan  No follow-ups on file. Veronia Goon, DO 10/13/2023, 7:50 AM PGY-3, [facility] {    This will disappear when note is signed, click to select method of visit    :1}

## 2023-10-14 ENCOUNTER — Ambulatory Visit: Payer: Self-pay | Admitting: Gastroenterology

## 2023-10-15 ENCOUNTER — Other Ambulatory Visit: Payer: Self-pay | Admitting: Student

## 2023-10-15 ENCOUNTER — Other Ambulatory Visit (HOSPITAL_COMMUNITY): Payer: Self-pay

## 2023-10-15 ENCOUNTER — Ambulatory Visit (INDEPENDENT_AMBULATORY_CARE_PROVIDER_SITE_OTHER): Admitting: Student

## 2023-10-15 ENCOUNTER — Other Ambulatory Visit: Payer: Self-pay

## 2023-10-15 ENCOUNTER — Encounter: Payer: Self-pay | Admitting: Student

## 2023-10-15 VITALS — BP 118/76 | HR 76 | Wt 296.0 lb

## 2023-10-15 DIAGNOSIS — Z7689 Persons encountering health services in other specified circumstances: Secondary | ICD-10-CM | POA: Diagnosis not present

## 2023-10-15 DIAGNOSIS — M539 Dorsopathy, unspecified: Secondary | ICD-10-CM | POA: Diagnosis not present

## 2023-10-15 DIAGNOSIS — E559 Vitamin D deficiency, unspecified: Secondary | ICD-10-CM

## 2023-10-15 MED ORDER — TIZANIDINE HCL 4 MG PO TABS
4.0000 mg | ORAL_TABLET | Freq: Every day | ORAL | 0 refills | Status: DC
  Filled 2023-10-15: qty 30, 30d supply, fill #0

## 2023-10-15 NOTE — Progress Notes (Signed)
  SUBJECTIVE:   CHIEF COMPLAINT / HPI:   Bilateral leg and back pain: She experiences bilateral lower back pain, currently rated as 5 out of 10, previously 10 out of 10. The pain worsens with prolonged sitting, common during her work shifts.  She describes her leg pain as improved.  She has lost 5 to 10 pounds recently, attributed to emotional stress following her nephew's unexpected death from a heart attack.   She is managing her weight and exploring nutritional support through [facility], preferring dietary changes over injectable medications. She previously tried Trulicity  injections.  PERTINENT  PMH / PSH: HTN, HLD, obesity, OSA on CPAP, T2DM, cardiomegaly, ascending aortic dilation, tobacco use  OBJECTIVE:  BP 118/76   Pulse 76   Wt 296 lb (134.3 kg)   LMP 11/21/2012   SpO2 95%   BMI 46.36 kg/m  General: Well-appearing, NAD Psych: Normal mood and affect  ASSESSMENT/PLAN:   Assessment & Plan Multilevel degenerative disc disease Chronic degenerative disc disease in the cervical and lumbar spine. Imaging shows degenerative changes without fractures or disc protrusion. Emphasized posture and muscle strength in symptom management. Discussed treatment options including medications, physical therapy, and lifestyle modifications. She prefers muscle relaxants and physical therapy. Surgery is a last resort due to low success rates. - Provided educational handout - Prescribe muscle relaxant to be taken before bed.  Encouraged conservative management with Tylenol /ibuprofen - Refer to physical therapy. - Patient to contact nutritionist for weight management, patient will let me know should she want to consider GLP-1s Vitamin D  deficiency Follow-up in 1 month for recheck. Return in about 1 month (around 11/15/2023) for Vitamin D  follow-up. Veronia Goon, DO 10/15/2023, 9:33 AM PGY-3, [facility]

## 2023-10-15 NOTE — Assessment & Plan Note (Signed)
Follow up in 1 month for recheck.

## 2023-10-15 NOTE — Patient Instructions (Addendum)
 It was great to see you today! Thank you for choosing [facility] for your primary care.  Today we addressed: Degenerative disc disease: We are going to try physical therapy and muscle relaxer to help you sleep.  We also discussed that you will be contacting your nutritionist for healthy eating habits to assist with losing weight.  Please let me know if you have interest in starting a weight loss medication. We should recheck your vitamin D  levels in 1 month Please contact your GI physician regarding your colonoscopy results.  They look normal upon my review.  If you haven't already, sign up for My Chart to have easy access to your labs results, and communication with your primary care physician.  Return in about 1 month (around 11/15/2023) for Vitamin D  follow-up. Please arrive 15 minutes before your appointment to ensure smooth check in process.  We appreciate your efforts in making this happen.  Thank you for allowing me to participate in your care, Veronia Goon, DO 10/15/2023, 8:56 AM PGY-3, [facility]

## 2023-10-15 NOTE — Assessment & Plan Note (Addendum)
 Chronic degenerative disc disease in the cervical and lumbar spine. Imaging shows degenerative changes without fractures or disc protrusion. Emphasized posture and muscle strength in symptom management. Discussed treatment options including medications, physical therapy, and lifestyle modifications. She prefers muscle relaxants and physical therapy. Surgery is a last resort due to low success rates. - Provided educational handout - Prescribe muscle relaxant to be taken before bed.  Encouraged conservative management with Tylenol /ibuprofen - Refer to physical therapy. - Patient to contact nutritionist for weight management, patient will let me know should she want to consider GLP-1s

## 2023-10-16 ENCOUNTER — Telehealth: Payer: Self-pay

## 2023-10-16 NOTE — Telephone Encounter (Signed)
 Patient calls nurse line requesting an antibiotic.   She reports last night at work she began to have ear pain and reports she felt "swimmy."   She reports the staff nurse looked in her ear and noticed fluid and redness, left ear only.   She denies any fevers, dizziness or vision changes.   She reports she was "just in yesterday to see Dr. Janae Mclean."   Advised she may need an additional clinic visit for concern.   Will forward to PCP.

## 2023-10-17 NOTE — Telephone Encounter (Signed)
 Attempted to contact patient to schedule an apt.   However, no answer.   Vm left asking her to call back to schedule if she was still experiencing ear pain.

## 2023-10-20 ENCOUNTER — Other Ambulatory Visit (HOSPITAL_COMMUNITY): Payer: Self-pay

## 2023-10-20 ENCOUNTER — Other Ambulatory Visit: Payer: Self-pay

## 2023-10-20 ENCOUNTER — Encounter: Payer: Self-pay | Admitting: Family Medicine

## 2023-10-20 ENCOUNTER — Ambulatory Visit (INDEPENDENT_AMBULATORY_CARE_PROVIDER_SITE_OTHER): Admitting: Family Medicine

## 2023-10-20 VITALS — BP 118/57 | HR 69 | Ht 67.0 in | Wt 304.0 lb

## 2023-10-20 DIAGNOSIS — H9392 Unspecified disorder of left ear: Secondary | ICD-10-CM

## 2023-10-20 DIAGNOSIS — Z1231 Encounter for screening mammogram for malignant neoplasm of breast: Secondary | ICD-10-CM

## 2023-10-20 DIAGNOSIS — E1149 Type 2 diabetes mellitus with other diabetic neurological complication: Secondary | ICD-10-CM

## 2023-10-20 DIAGNOSIS — Z0000 Encounter for general adult medical examination without abnormal findings: Secondary | ICD-10-CM

## 2023-10-20 DIAGNOSIS — I10 Essential (primary) hypertension: Secondary | ICD-10-CM | POA: Diagnosis not present

## 2023-10-20 DIAGNOSIS — E1140 Type 2 diabetes mellitus with diabetic neuropathy, unspecified: Secondary | ICD-10-CM | POA: Insufficient documentation

## 2023-10-20 MED ORDER — PREGABALIN 25 MG PO CAPS
25.0000 mg | ORAL_CAPSULE | Freq: Every day | ORAL | 1 refills | Status: DC
  Filled 2023-10-20: qty 30, 30d supply, fill #0

## 2023-10-20 NOTE — Assessment & Plan Note (Signed)
 Reminded to schedule diabetic eye exam. Mammogram ordered, given instructions on how to schedule

## 2023-10-20 NOTE — Progress Notes (Signed)
  Date of Visit: 10/20/2023   SUBJECTIVE:   HPI:  Virginia Levine presents today for a same day appointment to discuss ear issues. She is accompanied by her niece.  Noticed the other day that she was feeling somewhat off balance.  No fevers.  Asked a nurse at her work to look in her ear, they noted wax and possible redness/fluid behind the ear.  It is mostly her left ear that bothers her.  No real pain in the ear. On further questioning endorses feeling lightheaded whenever she stands up and moves around. Takes it slowly getting up. Is worried she is going to fall.   Sensation in feet: Notes that at night when she lays down has a feeling of something crawling beneath her feet.  This has happened even when she switched how she was laying.  Other people have laid in her bed and did not have that sensation.  Does have history of diabetes.  Reports she has taken gabapentin  in the past, but it made the pain in her legs worse.  Is agreeable to trying a new type of medication.  Hypertension - currently taking amlodipine  5mg  daily, carvedilol  37.5mg  twice daily, and losartan -hydrochlorothiazide  100-25mg  daily.  PMHx: history of hypertension, obesity, OSA, type 2 diabetes, hyperlipidemia, tobacco use disorder, CAD, hepatic steatosis, depression  OBJECTIVE:   BP (!) 118/57   Pulse 69   Ht 5\' 7"  (1.702 m)   Wt (!) 304 lb (137.9 kg)   LMP 11/21/2012   SpO2 97%   BMI 47.61 kg/m  Gen: no acute distress, pleasant cooperative, well appearing HEENT: Normocephalic, atraumatic, bilateral ear canals with cerumen present, visualized part of tympanic membranes appear normal bilaterally though view was obstructed by cerumen.  Nares patent. Heart: Regular rate and rhythm, no murmur Lungs: Clear bilaterally, normal effort Neuro: Grossly nonfocal, speech normal  Orthostatic vitals Lying 126/69 Sitting 124/71 Standing 103/63    ASSESSMENT/PLAN:   Assessment & Plan Ear problem, left Unable to curette entirety  of wax out of ears today, and unfortunately the clinic is out of peroxide for wax removal.  Discussed using Debrox. Other diabetic neurological complication associated with type 2 diabetes mellitus (HCC) Sensation reported in feet seems consistent with neuropathy.   Diabetic foot exam confirms this Previously did not tolerate gabapentin .  Trial of Lyrica  at bedtime, prescription sent. Routine adult health maintenance Reminded to schedule diabetic eye exam. Mammogram ordered, given instructions on how to schedule Essential hypertension Hypertension with superimposed orthostatic hypotension.  Orthostatic vital signs significantly positive today.  Will stop amlodipine .  Follow-up in several weeks with PCP to recheck blood pressure.    [place] J. Dawn Eth, MD [facility]

## 2023-10-20 NOTE — Patient Instructions (Addendum)
 It was great to see you again today.  For your ear - increase flonase  to 2 sprays per nostril each day Ok to take the xyzal you purchased  For lightheadedness - your blood pressure drops when you stand up. I suspect this is the cause of your symptoms. Let's stop your amlodipine  for now. Follow up in several weeks with Dr. Janae Mclean to see how your blood pressure is doing.  For your feet - start lyrica  at night, let's see if that helps You do have some signs of neuropathy  Go get your eye exam To schedule your mammogram, call the [facility] at [phone]. They are located at [address], [address].        Be well, Dr. Dawn Eth

## 2023-10-20 NOTE — Telephone Encounter (Signed)
 Patient returns call to nurse line. Patient reports continued issues with potential fluid in her ear.   She is requesting AM appointment with Dr. Janae Mclean today. Advised that PCP would not be available until tomorrow afternoon. Patient elects to see another provider this AM.   Scheduled same day appointment with Dr. Dawn Eth.   Elsie Halo, RN

## 2023-10-20 NOTE — Assessment & Plan Note (Addendum)
 Sensation reported in feet seems consistent with neuropathy.   Diabetic foot exam confirms this Previously did not tolerate gabapentin .  Trial of Lyrica  at bedtime, prescription sent.

## 2023-10-22 NOTE — Assessment & Plan Note (Signed)
 Hypertension with superimposed orthostatic hypotension.  Orthostatic vital signs significantly positive today.  Will stop amlodipine .  Follow-up in several weeks with PCP to recheck blood pressure.

## 2023-10-31 ENCOUNTER — Ambulatory Visit
Admission: RE | Admit: 2023-10-31 | Discharge: 2023-10-31 | Disposition: A | Discharge status: Home / Self Care | Source: Ambulatory Visit | Attending: Family Medicine | Admitting: Family Medicine

## 2023-10-31 DIAGNOSIS — Z7689 Persons encountering health services in other specified circumstances: Secondary | ICD-10-CM | POA: Diagnosis not present

## 2023-10-31 DIAGNOSIS — Z1231 Encounter for screening mammogram for malignant neoplasm of breast: Secondary | ICD-10-CM | POA: Diagnosis not present

## 2023-11-03 ENCOUNTER — Other Ambulatory Visit (HOSPITAL_COMMUNITY): Payer: Self-pay

## 2023-11-03 ENCOUNTER — Other Ambulatory Visit: Payer: Self-pay

## 2023-11-03 ENCOUNTER — Ambulatory Visit (INDEPENDENT_AMBULATORY_CARE_PROVIDER_SITE_OTHER): Admitting: Student

## 2023-11-03 ENCOUNTER — Other Ambulatory Visit: Payer: Self-pay | Admitting: Family Medicine

## 2023-11-03 VITALS — BP 145/88 | HR 74 | Wt 299.8 lb

## 2023-11-03 DIAGNOSIS — L918 Other hypertrophic disorders of the skin: Secondary | ICD-10-CM | POA: Diagnosis not present

## 2023-11-03 DIAGNOSIS — E559 Vitamin D deficiency, unspecified: Secondary | ICD-10-CM | POA: Diagnosis not present

## 2023-11-03 DIAGNOSIS — G479 Sleep disorder, unspecified: Secondary | ICD-10-CM | POA: Diagnosis not present

## 2023-11-03 DIAGNOSIS — H6123 Impacted cerumen, bilateral: Secondary | ICD-10-CM | POA: Diagnosis not present

## 2023-11-03 DIAGNOSIS — G5793 Unspecified mononeuropathy of bilateral lower limbs: Secondary | ICD-10-CM | POA: Diagnosis not present

## 2023-11-03 DIAGNOSIS — I10 Essential (primary) hypertension: Secondary | ICD-10-CM

## 2023-11-03 DIAGNOSIS — Z7689 Persons encountering health services in other specified circumstances: Secondary | ICD-10-CM | POA: Diagnosis not present

## 2023-11-03 DIAGNOSIS — M79604 Pain in right leg: Secondary | ICD-10-CM

## 2023-11-03 MED ORDER — DEBROX 6.5 % OT SOLN
5.0000 [drp] | Freq: Two times a day (BID) | OTIC | 0 refills | Status: DC
  Filled 2023-11-03: qty 15, 30d supply, fill #0

## 2023-11-03 MED ORDER — PREGABALIN 100 MG PO CAPS
100.0000 mg | ORAL_CAPSULE | Freq: Every day | ORAL | 0 refills | Status: DC
  Filled 2023-11-03: qty 30, 30d supply, fill #0

## 2023-11-03 MED ORDER — PREGABALIN 100 MG PO CAPS: 100.0000 mg | ORAL_CAPSULE | Freq: Every day | ORAL | 0 refills | Status: DC

## 2023-11-03 NOTE — Addendum Note (Signed)
 Addended by: Iantha Mainland on: 11/03/2023 10:36 AM   Modules accepted: Orders

## 2023-11-03 NOTE — Assessment & Plan Note (Signed)
 Irritated for patient, consistent with acrochordon.  I am amenable to removing this with lidocaine  local anesthetic and shave biopsy.

## 2023-11-03 NOTE — Addendum Note (Signed)
 Addended by: Iantha Mainland on: 11/03/2023 09:55 AM   Modules accepted: Orders

## 2023-11-03 NOTE — Assessment & Plan Note (Signed)
 BP: (!) 145/88 today. Poorly controlled. Goal of avoiding hypotension.  She had component of orthostatic hypotension at previous visit, aim of avoiding BP greater than 160/110. Medication regimen: Carvedilol  37.5 mg twice daily, losartan -HCTZ 100-25 mg daily.  Formally discontinue amlodipine  at this time.

## 2023-11-03 NOTE — Assessment & Plan Note (Signed)
>>  ASSESSMENT AND PLAN FOR LEG PAIN, BILATERAL WRITTEN ON 11/03/2023  9:15 AM BY Seng Larch, DO  Multifactorial relating to multilevel degenerative disc disease and diabetes.  Provided information for getting established with physical therapy.  Counseled against random formulation of drug adds as these are marketed highly and are not well supported in evidence-based medical literature.

## 2023-11-03 NOTE — Patient Instructions (Addendum)
 PT number [facility] [address] [place], [place] [address] [phone]   Good sleep habits (sometimes referred to as "sleep hygiene") can help you get a good night's sleep. Be consistent. Go to bed at the same time each night and get up at the same time each morning, including on the weekends  - Try the following to help you sleep better:  - limit naps during the day  - no screens (TV, phone, tablet, computer) at least 1-2 hours before bedtime. Use night mode on your phone.  - have a quiet and dark sleeping environment.  - no large meals or drinks about 1 hour before bed.  - avoid caffeine or alcohol 4-5 hours before bed.  - Avoid taking diuretics (hydrochlorothiazide , furosemide) in the evenings.  - Exercise or move your body regularly every day. Being physically active during the day can help you fall asleep more easily at night. - You can also try melatonin 1 mg over the counter. Take this 1-2 hours before bed.  - If you are lying in bed for 30 mins-1 hour and aren't falling asleep, get out of bed and do something relaxing like reading (NO TV!) until you are tired.  We are checking some labs today, I will also prescribe you Debrox drops for the earwax in your ears.

## 2023-11-03 NOTE — Assessment & Plan Note (Signed)
 Recheck today.

## 2023-11-03 NOTE — Assessment & Plan Note (Signed)
 Multifactorial relating to multilevel degenerative disc disease and diabetes.  Provided information for getting established with physical therapy.  Counseled against random formulation of drug adds as these are marketed highly and are not well supported in evidence-based medical literature.

## 2023-11-03 NOTE — Progress Notes (Signed)
  SUBJECTIVE:   CHIEF COMPLAINT / HPI:   Hypertension: Home medications include: Carvedilol  37.5 mg twice daily, losartan -HCTZ 100-25 mg daily.  Due to orthostatic blood pressures at last visit, she was advised to hold amlodipine  5 mg daily.  She experiences discomfort in her feet and occasionally in her buttocks, particularly when sitting or lying down, despite taking Lyrica . She has tried increasing her Lyrica  dosage from 25 mg to 100 mg, finding the higher dose more effective. She is exploring other treatments like Nerve Flow and Aria Leaf but is unsure of their efficacy.  She has diabetes and is concerned about its impact on her nerves. She experiences numbness in her feet and swelling in her right ankle. She has stopped taking amlodipine  but continues with other blood pressure medications.  She has reduced alcohol consumption and made dietary changes, including reducing nighttime snacking and incorporating healthier options. She has lost five pounds since her last visit and is working with a nutritionist to improve her diet.   PERTINENT  PMH / PSH: HTN, HLD, obesity, OSA on CPAP, T2DM, cardiomegaly, ascending aortic dilation, tobacco use   OBJECTIVE:  BP (!) 145/88   Pulse 74   Wt 299 lb 12.8 oz (136 kg)   LMP 11/21/2012   SpO2 90%   BMI 46.96 kg/m  General: Well-appearing, NAD HEENT: Cerumen impaction bilaterally Skin: Approximately 1 cm hyperpigmented soft lesion above the skin surface with trace pedunculated stalk on the anterior neck consistent with acrochordon   ASSESSMENT/PLAN:   Assessment & Plan Essential hypertension BP: (!) 145/88 today. Poorly controlled. Goal of avoiding hypotension.  She had component of orthostatic hypotension at previous visit, aim of avoiding BP greater than 160/110. Medication regimen: Carvedilol  37.5 mg twice daily, losartan -HCTZ 100-25 mg daily.  Formally discontinue amlodipine  at this time. Vitamin D  deficiency Recheck today Neuropathy  involving both lower extremities Multifactorial relating to multilevel degenerative disc disease and diabetes.  Provided information for getting established with physical therapy.  Counseled against random formulation of drug adds as these are marketed highly and are not well supported in evidence-based medical literature. Hyperbilirubinemia Previous bilirubin 1.4, suspect this was related to alcohol use.  Recheck today.  Should this stay elevated, will obtain right upper quadrant ultrasound. Bilateral impacted cerumen Trial Debrox drops. Difficulty sleeping Related to lower extremity neuropathy, provided sleep hygiene education and instructions.  Increase Lyrica  as above, trial melatonin.  Consider low-dose trazodone or see drug in the future.  Advised against over-the-counter Benadryl given correlations of cognitive decline. Acrochordon Irritated for patient, consistent with acrochordon.  I am amenable to removing this with lidocaine  local anesthetic and shave biopsy.  Return if symptoms worsen or fail to improve. Veronia Goon, DO 11/03/2023, 9:12 AM PGY-3, [facility]

## 2023-11-04 ENCOUNTER — Other Ambulatory Visit (HOSPITAL_COMMUNITY): Payer: Self-pay

## 2023-11-04 ENCOUNTER — Ambulatory Visit: Payer: Self-pay | Admitting: Student

## 2023-11-04 ENCOUNTER — Other Ambulatory Visit: Payer: Self-pay

## 2023-11-04 LAB — COMPREHENSIVE METABOLIC PANEL WITH GFR
ALT: 17 IU/L (ref 0–32)
AST: 20 IU/L (ref 0–40)
Albumin: 4.5 g/dL (ref 3.8–4.9)
Alkaline Phosphatase: 112 IU/L (ref 44–121)
BUN/Creatinine Ratio: 17 (ref 12–28)
BUN: 11 mg/dL (ref 8–27)
Bilirubin Total: 0.8 mg/dL (ref 0.0–1.2)
CO2: 25 mmol/L (ref 20–29)
Calcium: 9.7 mg/dL (ref 8.7–10.3)
Chloride: 104 mmol/L (ref 96–106)
Creatinine, Ser: 0.64 mg/dL (ref 0.57–1.00)
Globulin, Total: 2.2 g/dL (ref 1.5–4.5)
Glucose: 112 mg/dL — ABNORMAL HIGH (ref 70–99)
Potassium: 4.1 mmol/L (ref 3.5–5.2)
Sodium: 145 mmol/L — ABNORMAL HIGH (ref 134–144)
Total Protein: 6.7 g/dL (ref 6.0–8.5)
eGFR: 101 mL/min/{1.73_m2} (ref >59)

## 2023-11-04 LAB — BILIRUBIN, FRACTIONATED(TOT/DIR/INDIR)
Bilirubin, Direct: 0.26 mg/dL (ref 0.00–0.40)
Bilirubin, Indirect: 0.54 mg/dL (ref 0.10–0.80)

## 2023-11-04 LAB — VITAMIN D 25 HYDROXY (VIT D DEFICIENCY, FRACTURES): Vit D, 25-Hydroxy: 40 ng/mL (ref 30.0–100.0)

## 2023-11-10 ENCOUNTER — Telehealth: Payer: Self-pay

## 2023-11-10 NOTE — Telephone Encounter (Signed)
 Patient calls nurse line requesting an alternative medication.   She reports Lyrica  100mg  is not working well for her. She reports she is not sleeping due to discomfort. She reports she has not slept all weekend.   She reports she takes 100mg  per day.   She is requesting an alternative medication.   Advised will forward to PCP.

## 2023-11-11 ENCOUNTER — Encounter: Payer: Self-pay | Admitting: *Deleted

## 2023-11-13 DIAGNOSIS — Z419 Encounter for procedure for purposes other than remedying health state, unspecified: Secondary | ICD-10-CM | POA: Diagnosis not present

## 2023-11-14 NOTE — Telephone Encounter (Signed)
 Patient contacted.   Patient advised of PT recommendation.   Outpatient Rehab information given to patient.   Patient requests to see PCP before graduation.   Patient scheduled for 6/24.

## 2023-11-19 ENCOUNTER — Other Ambulatory Visit (HOSPITAL_COMMUNITY): Payer: Self-pay

## 2023-11-19 ENCOUNTER — Other Ambulatory Visit: Payer: Self-pay | Admitting: Student

## 2023-11-19 DIAGNOSIS — E559 Vitamin D deficiency, unspecified: Secondary | ICD-10-CM

## 2023-11-20 ENCOUNTER — Telehealth: Payer: Self-pay

## 2023-11-20 ENCOUNTER — Other Ambulatory Visit (HOSPITAL_COMMUNITY): Payer: Self-pay

## 2023-11-20 DIAGNOSIS — M79604 Pain in right leg: Secondary | ICD-10-CM

## 2023-11-20 DIAGNOSIS — M79605 Pain in left leg: Secondary | ICD-10-CM

## 2023-11-20 MED ORDER — PREGABALIN 100 MG PO CAPS
100.0000 mg | ORAL_CAPSULE | Freq: Two times a day (BID) | ORAL | 0 refills | Status: DC
  Filled 2023-11-20 – 2023-11-21 (×2): qty 15, 8d supply, fill #0

## 2023-11-20 NOTE — Telephone Encounter (Signed)
 Patient calls nurse line in regards to Lyrica .   She reports she has been taking (2) 100mg  tablets each morning when she gets home from work vs (1) 100mg .   She reports taking (2) is the only way she is able to get relief.  She reports she is about to run out of her prescription due to increase. She is requesting a refill reflecting 100mg  BID.   She has an apt with PCP on 6/24.  Advised will forward to PCP.

## 2023-11-21 ENCOUNTER — Other Ambulatory Visit (HOSPITAL_COMMUNITY): Payer: Self-pay

## 2023-11-21 NOTE — Telephone Encounter (Signed)
 Patient has been updated.  Patient reminded of 6/24 apt with PCP.   Patient was appreciative.

## 2023-11-24 NOTE — Progress Notes (Unsigned)
  SUBJECTIVE:   CHIEF COMPLAINT / HPI:   Virginia Levine is a 58 year old female who presents with ear congestion and bowel changes.  She experiences ear congestion and has been using Debrox drops which she started last week and uses every other day. Both ears were clogged upon waking yesterday, with more fluid in the right ear. Her hearing is muffled on the right, and there is mild pain when pressing on the right ear.   Separately, there is a significant change in the odor of her stools since her colonoscopy in early May. She experiences constipation, with straining during daily bowel movements.  She stools once daily.  No recent medication changes for her bowels. Dietary changes include removing junk food and incorporating salads with spinach, chicken breast in a can, and crackers, while avoiding bread and green vegetables.  She is concerned about diabetic neuropathy, causing leg pain, particularly at night, affecting sleep. She takes pregabalin , 200 mg at night, which provides some relief but does not fully alleviate the pain. She reports balance issues but has not experienced any falls.  PERTINENT  PMH / PSH: HTN, HLD, obesity, OSA on CPAP, T2DM, cardiomegaly, ascending aortic dilation, tobacco use   OBJECTIVE:  BP 136/79   Pulse 84   Ht 5' 7 (1.702 m)   Wt 296 lb 6.4 oz (134.4 kg)   LMP 11/21/2012   SpO2 98%   BMI 46.42 kg/m  General: Well-appearing, NAD HEENT: Right ear canal edema, unable to visualize tympanic membrane, no present cerumen, left tympanic membrane normal  ASSESSMENT/PLAN:   Assessment & Plan Acute swimmer's ear of right side Unable to appreciate right TM.  Edema and symptoms consistent with otitis externa, treat with Cipro-hydrocortisone drops given I am unable to appreciate if tympanic membrane is intact.  Will need reevaluation if not improved, potentially by ENT. Other diabetic neurological complication associated with type 2 diabetes mellitus (HCC) Not yet  working with physical therapy, emphasized how critical this is.  Balance affected however no falls.  Keep Lyrica  at current dosage.  Reevaluate 4 to 6 weeks after working with physical therapy and if no improvement, recommend neurology evaluation. Constipation, unspecified constipation type Straining, single stool per day not consistent with infectious causes.  Do not recommend stool testing.  Likely related to dietary changes, recommend MiraLAX daily for constipation. Return if symptoms worsen or fail to improve. Kieth Johnson, DO 11/25/2023, 9:21 AM PGY-3, [facility]

## 2023-11-25 ENCOUNTER — Other Ambulatory Visit: Payer: Self-pay

## 2023-11-25 ENCOUNTER — Encounter: Payer: Self-pay | Admitting: Student

## 2023-11-25 ENCOUNTER — Ambulatory Visit: Admitting: Student

## 2023-11-25 ENCOUNTER — Other Ambulatory Visit (HOSPITAL_COMMUNITY): Payer: Self-pay

## 2023-11-25 VITALS — BP 136/79 | HR 84 | Ht 67.0 in | Wt 296.4 lb

## 2023-11-25 DIAGNOSIS — H60331 Swimmer's ear, right ear: Secondary | ICD-10-CM | POA: Diagnosis not present

## 2023-11-25 DIAGNOSIS — E1149 Type 2 diabetes mellitus with other diabetic neurological complication: Secondary | ICD-10-CM

## 2023-11-25 DIAGNOSIS — K5900 Constipation, unspecified: Secondary | ICD-10-CM | POA: Diagnosis not present

## 2023-11-25 DIAGNOSIS — Z7689 Persons encountering health services in other specified circumstances: Secondary | ICD-10-CM | POA: Diagnosis not present

## 2023-11-25 MED ORDER — CIPROFLOXACIN-HYDROCORTISONE 0.2-1 % OT SUSP
3.0000 [drp] | Freq: Two times a day (BID) | OTIC | 0 refills | Status: AC
  Filled 2023-11-25: qty 10, 7d supply, fill #0

## 2023-11-25 MED ORDER — PREGABALIN 100 MG PO CAPS
100.0000 mg | ORAL_CAPSULE | Freq: Two times a day (BID) | ORAL | 0 refills | Status: DC
  Filled 2023-11-25 – 2023-11-27 (×2): qty 60, 30d supply, fill #0

## 2023-11-25 NOTE — Patient Instructions (Addendum)
 It was great to see you today! Thank you for choosing [facility] for your primary care.  Today we addressed: Constipation: MiraLAX daily Otitis externa: Ciprodex drops for 7 days.  Reevaluate afterwards, may need ENT referral if not improving. Neuropathy: Continue Lyrica  at current dosage.  I want you to work with physical therapy for 4 to 6 weeks and then be reevaluated by us .  If there is absolutely no improvement, neurology referral.  If you haven't already, sign up for My Chart to have easy access to your labs results, and communication with your primary care physician.  Return if symptoms worsen or fail to improve. Please arrive 15 minutes before your appointment to ensure smooth check in process.  We appreciate your efforts in making this happen.  Thank you for allowing me to participate in your care, Kieth Johnson, DO 11/25/2023, 9:01 AM PGY-3, [facility]

## 2023-11-25 NOTE — Assessment & Plan Note (Signed)
 Not yet working with physical therapy, emphasized how critical this is.  Balance affected however no falls.  Keep Lyrica  at current dosage.  Reevaluate 4 to 6 weeks after working with physical therapy and if no improvement, recommend neurology evaluation.

## 2023-11-27 ENCOUNTER — Other Ambulatory Visit (HOSPITAL_COMMUNITY): Payer: Self-pay

## 2023-11-29 ENCOUNTER — Other Ambulatory Visit (HOSPITAL_COMMUNITY): Payer: Self-pay

## 2023-12-01 ENCOUNTER — Other Ambulatory Visit (HOSPITAL_COMMUNITY): Payer: Self-pay

## 2023-12-11 NOTE — Therapy (Incomplete)
 OUTPATIENT PHYSICAL THERAPY THORACOLUMBAR EVALUATION   Patient Name: Virginia Levine MRN: [id number] DOB:1965/11/24, 58 y.o., female Today's Date: 12/11/2023  END OF SESSION:   Past Medical History:  Diagnosis Date   Ascending aorta dilation (HCC) 10/27/2021   Echocardiogram 10/2021: Ascending aorta 39 mm   Cardiomegaly 07/10/2021   Echocardiogram 10/2021: EF 60-65, no RWMA, mod LVH, Gr 1 DD, normal RVSF, normal PASP, severe LAE, mild MR, ascending aorta 39 mm   Coronary artery disease 07/24/2021   CCTA 2/23: CAC score 601 (99th percentile); pLAD 25-49, mLAD 1-24, pD1 25-49, pLCx 25-49, pOM1 1-24; FFR negative; Asc Aorta 3.6 cm; hepatic steatosis    Diabetes mellitus without complication (HCC)    Hyperlipidemia    Hypertension    Obesity    OSA (obstructive sleep apnea)    Pre-diabetes    Sleep apnea    Tobacco use    Vitamin D  deficiency 11/19/2022   Past Surgical History:  Procedure Laterality Date   LEEP  2018   Patient Active Problem List   Diagnosis Date Noted   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/03/2023   Diabetic neuropathy (HCC) 08/27/2023   Housing insecurity 04/04/2023   Right knee pain 04/04/2023   Alcohol use 12/13/2022   Vitamin D  deficiency 11/19/2022   Multilevel degenerative disc disease 02/19/2022   Ascending aorta dilation (HCC) 10/27/2021   Depression 08/30/2021   Fatigue 08/30/2021   Hepatic steatosis 08/01/2021   Coronary artery disease involving native coronary artery of native heart without angina pectoris 07/24/2021   Cardiomegaly 07/10/2021   Routine adult health maintenance 07/04/2021   Insomnia 04/13/2020   Flat foot 04/13/2020   Type 2 diabetes mellitus without complications (HCC) 02/14/2020   OSA (obstructive sleep apnea) 12/15/2019   Gastroesophageal reflux disease 03/16/2018   History of abnormal cervical Pap smear 11/26/2016   Other specified abnormal uterine and vaginal bleeding 03/24/2015   Rectal bleed 03/24/2015   Hyperlipidemia 01/11/2015    Obesity 01/21/2013   Tobacco abuse 01/21/2013   Essential hypertension 12/11/2012    PCP: Delores Suzann HERO, MD  REFERRING PROVIDER: Delores Suzann HERO, MD  REFERRING DIAG: M53.9 (ICD-10-CM) - Multilevel degenerative disc disease   Rationale for Evaluation and Treatment: Rehabilitation  THERAPY DIAG:  No diagnosis found.  ONSET DATE: ***  SUBJECTIVE:                                                                                                                                                                                           SUBJECTIVE STATEMENT: ***  PERTINENT HISTORY:  ***  PAIN:  Are you having pain? Yes: NPRS scale: *** Pain location: *** Pain description: ***  Aggravating factors: *** Relieving factors: ***  PRECAUTIONS: {Therapy precautions:24002}  RED FLAGS: {PT Red Flags:29287}   WEIGHT BEARING RESTRICTIONS: {Yes ***/No:24003}  FALLS:  Has patient fallen in last 6 months? {fallsyesno:27318}  LIVING ENVIRONMENT: Lives with: {[facility] lives with:25569::lives with their family} Lives in: {Lives in:25570} Stairs: {opstairs:27293} Has following equipment at home: {Assistive devices:23999}  OCCUPATION: ***  PLOF: {PLOF:24004}  PATIENT GOALS: ***  NEXT MD VISIT: ***  OBJECTIVE:  Note: Objective measures were completed at Evaluation unless otherwise noted.  DIAGNOSTIC FINDINGS:  DG Lumbar spine 09/05/23 FINDINGS: Subtle degenerative disc disease with narrowing of the L4-5 disc space with minimal anterior osteophytic changes   There is mild degenerative facet disease L4-5 L5-S1   No spondylolysis or spondylolisthesis   IMPRESSION: Degenerative disc disease and facet changes as described.  DG Cervical spine 09/05/23 IMPRESSION: Degenerative disc disease C4-C5 C5-C6 C6-C7 as described. No foraminal narrowing.  PATIENT SURVEYS:  {rehab surveys:24030}  COGNITION: Overall cognitive status:  {cognition:24006}     SENSATION: {sensation:27233}  MUSCLE LENGTH: Hamstrings: Right *** deg; Left *** deg Debby test: Right *** deg; Left *** deg  POSTURE: {posture:25561}  PALPATION: ***  LUMBAR ROM:   AROM eval  Flexion   Extension   Right lateral flexion   Left lateral flexion   Right rotation   Left rotation    (Blank rows = not tested)  LOWER EXTREMITY ROM:     {AROM/PROM:27142}  Right eval Left eval  Hip flexion    Hip extension    Hip abduction    Hip adduction    Hip internal rotation    Hip external rotation    Knee flexion    Knee extension    Ankle dorsiflexion    Ankle plantarflexion    Ankle inversion    Ankle eversion     (Blank rows = not tested)  LOWER EXTREMITY MMT:    MMT Right eval Left eval  Hip flexion    Hip extension    Hip abduction    Hip adduction    Hip internal rotation    Hip external rotation    Knee flexion    Knee extension    Ankle dorsiflexion    Ankle plantarflexion    Ankle inversion    Ankle eversion     (Blank rows = not tested)  LUMBAR SPECIAL TESTS:  {lumbar special test:25242}  FUNCTIONAL TESTS:  {Functional tests:24029}  GAIT: Distance walked: *** Assistive device utilized: {Assistive devices:23999} Level of assistance: {Levels of assistance:24026} Comments: ***  TREATMENT DATE:  [facility] Adult PT Treatment:                                                DATE: 12/12/23 Therapeutic Exercise: *** Manual Therapy: *** Neuromuscular re-ed: *** Therapeutic Activity: *** Modalities: *** Self Care: ***  PATIENT EDUCATION:  Education details: *** Person educated: {Person educated:25204} Education method: {Education Method:25205} Education comprehension: {Education Comprehension:25206}  HOME EXERCISE PROGRAM: ***  ASSESSMENT:  CLINICAL IMPRESSION: Patient is  a 58 y.o. female who was seen today for physical therapy evaluation and treatment for M53.9 (ICD-10-CM) - Multilevel degenerative disc disease .   OBJECTIVE IMPAIRMENTS: {opptimpairments:25111}.   ACTIVITY LIMITATIONS: {activitylimitations:27494}  PARTICIPATION LIMITATIONS: {participationrestrictions:25113}  PERSONAL FACTORS: {Personal factors:25162} are also affecting patient's functional outcome.   REHAB POTENTIAL: {rehabpotential:25112}  CLINICAL DECISION MAKING: {clinical decision making:25114}  EVALUATION COMPLEXITY: {Evaluation complexity:25115}   GOALS:  SHORT TERM GOALS: Target date: ***  *** Baseline: Goal status: INITIAL  2.  *** Baseline:  Goal status: INITIAL  3.  *** Baseline:  Goal status: INITIAL  4.  *** Baseline:  Goal status: INITIAL  5.  *** Baseline:  Goal status: INITIAL  6.  *** Baseline:  Goal status: INITIAL  LONG TERM GOALS: Target date: ***  *** Baseline:  Goal status: INITIAL  2.  *** Baseline:  Goal status: INITIAL  3.  *** Baseline:  Goal status: INITIAL  4.  *** Baseline:  Goal status: INITIAL  5.  *** Baseline:  Goal status: INITIAL  6.  *** Baseline:  Goal status: INITIAL  PLAN:  PT FREQUENCY: {rehab frequency:25116}  PT DURATION: {rehab duration:25117}  PLANNED INTERVENTIONS: {rehab planned interventions:25118::97110-Therapeutic exercises,97530- Therapeutic [phone]- Neuromuscular re-education,97535- Self Rjmz,02859- Manual therapy}.  PLAN FOR NEXT SESSION: ***   Dasie Daft, PT 12/11/2023, 6:07 AM

## 2023-12-12 ENCOUNTER — Ambulatory Visit: Attending: Family Medicine

## 2023-12-13 DIAGNOSIS — Z419 Encounter for procedure for purposes other than remedying health state, unspecified: Secondary | ICD-10-CM | POA: Diagnosis not present

## 2023-12-22 IMAGING — CT CT HEART MORP W/ CTA COR W/ SCORE W/ CA W/CM &/OR W/O CM
4 of 7 series · 8 of 20 positions shown, 9 images · IV contrast (APPLIED)
Comparison: None

Addendum:
CLINICAL DATA: Chest pain

EXAM:
Cardiac CTA
MEDICATIONS:
Sub lingual nitro. 4mg and lopressor 100mg
TECHNIQUE: The patient was scanned on a Siemens Force [REDACTED]ice scanner. Gantry
rotation speed was 250 msecs. Collimation was .6 mm. A 100 kV
prospective scan was triggered in the ascending thoracic aorta at
140 HU's Full mA was used between 35% and 75% of the R-R interval.
Average HR during the scan was 64 bpm. The 3D data set was
interpreted on a dedicated work station using MPR, MIP and VRT
modes. A total of 80 cc of contrast was used.

[Series 6: best syst · axial · 0.44mm/px · z∈[-233,-190]mm · 2 of 323 slices shown, 3 images]
[im 108/323  vessel]
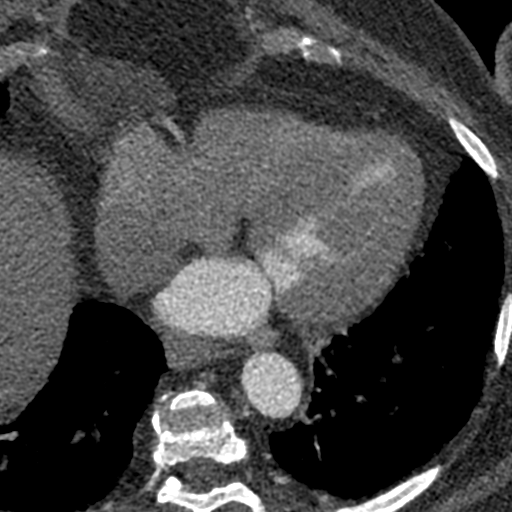
[im 108/323  lung]
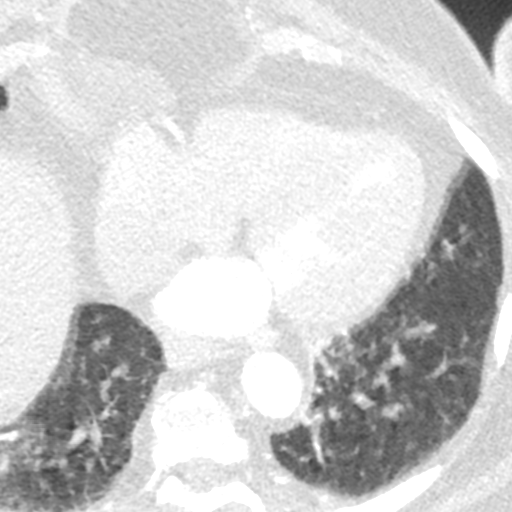
[im 215/323  vessel]
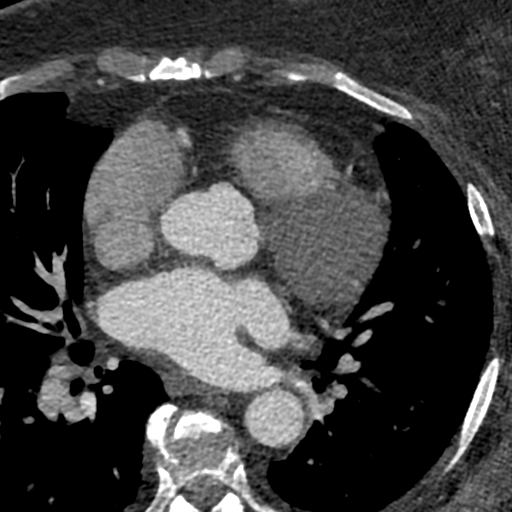

[Series 7: ts syst sharp · axial · 0.44mm/px · z∈[-233,-190]mm · 2 of 323 slices shown]
[im 108/323  lung]
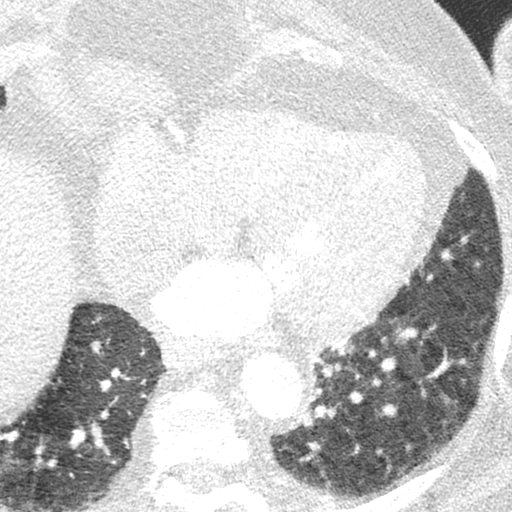
[im 215/323  lung]
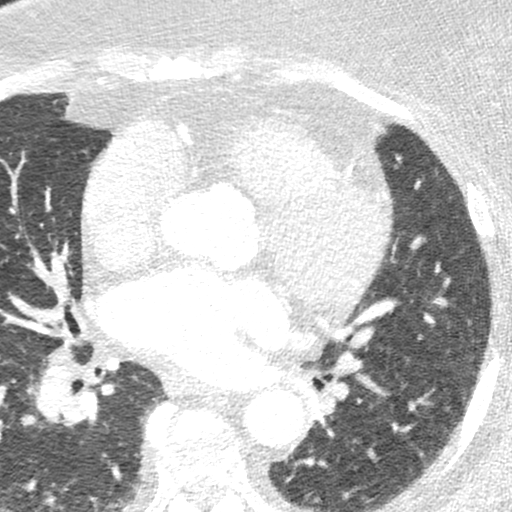

[Series 8: best diast · axial · 0.44mm/px · z∈[-233,-190]mm · 2 of 323 slices shown]
[im 108/323  vessel]
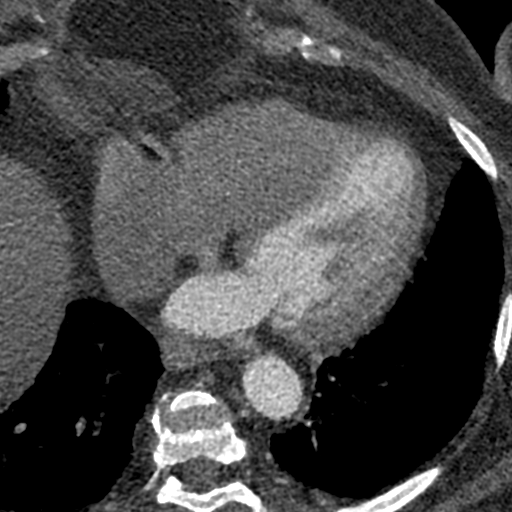
[im 215/323  vessel]
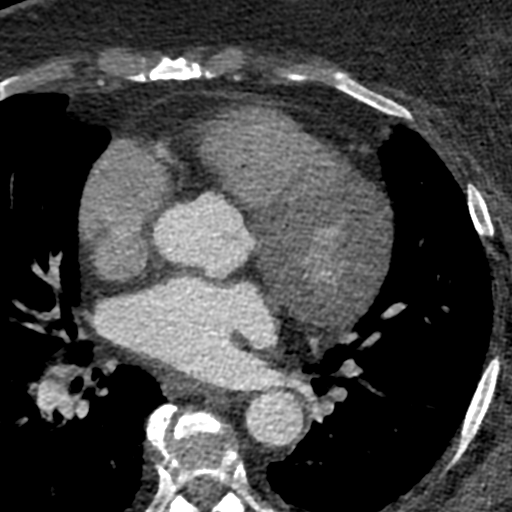

[Series 9: ts diast sharp · axial · 0.44mm/px · z∈[-233,-190]mm · 2 of 323 slices shown]
[im 108/323  lung]
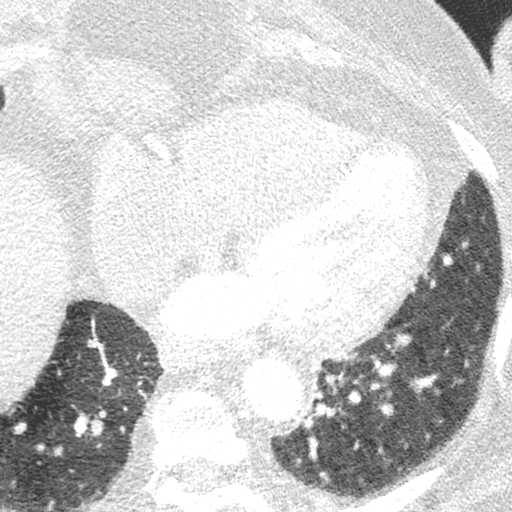
[im 215/323  lung]
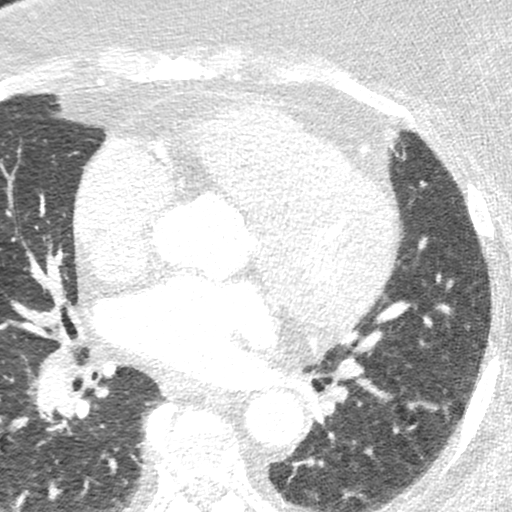

[8 of 20 positions shown; findings below may reference images not displayed]

FINDINGS: Non-cardiac: See separate report from [REDACTED]. No
significant findings on limited lung and soft tissue windows.

Calcium Score: 2 vessel coronary calcium noted

LM: 0

RCA: 0

LAD: 484

LCX: 117

Total: 601 which is 99 th percentile for age/sex

Coronary Arteries: Right dominant with no anomalies

LM: Normal

LAD: 25-49% calcified plaque proximally 1-24% calcified plaque mid
vessel

D1: 25-49% calcified proximal stenosis

D2: Normal

Circumflex: 25-49% calcified plaque proximally

OM1: 1-24% calcified plaque proximally

OM2: Normal

RCA: Normal

PDA: Normal

PLA: Normal
IMPRESSION: 1. Coronary calcium score 601 which is 99 th percentile for age/sex

2.  Normal ascending thoracic aorta 3.6 cm

3.  CAD RADS 2 Poor opacification of vessels Study sent for FFR CT

[place]

ADDENDUM:
OVER-READ INTERPRETATION  CT CHEST

The following report is an over-read performed by radiologist Dr.
over-read does not include interpretation of cardiac or coronary
anatomy or pathology. The coronary CT angiography evaluation and
coronary calcium scoring interpretation by the cardiologist is
attached. Imaging of the chest is focused on cardiac structures and
excludes much of the chest on CT.
FINDINGS: Cardiovascular: Please see dedicated report for cardiovascular
details.

Mediastinum/Nodes: Limited imaging of the mediastinum without signs
of adenopathy or acute process.

Lungs/Pleura: No consolidation or effusion. Visualized airways are
patent.

Upper Abdomen: Signs of hepatic steatosis difficult to quantify but
likely at least moderate. No acute upper abdominal findings to the
extent evaluated.

Musculoskeletal: No acute bone finding. No destructive bone process.
Spinal degenerative changes. Spinal degenerative changes are mild.
IMPRESSION: 1. No acute findings on limited imaging of the chest.
2. Signs of hepatic steatosis difficult to quantify but likely at
least moderate.

*** End of Addendum ***
FINDINGS: Non-cardiac: See separate report from [REDACTED]. No
significant findings on limited lung and soft tissue windows.

Calcium Score: 2 vessel coronary calcium noted

LM: 0

RCA: 0

LAD: 484

LCX: 117

Total: 601 which is 99 th percentile for age/sex

Coronary Arteries: Right dominant with no anomalies

LM: Normal

LAD: 25-49% calcified plaque proximally 1-24% calcified plaque mid
vessel

D1: 25-49% calcified proximal stenosis

D2: Normal

Circumflex: 25-49% calcified plaque proximally

OM1: 1-24% calcified plaque proximally

OM2: Normal

RCA: Normal

PDA: Normal

PLA: Normal
IMPRESSION: 1. Coronary calcium score 601 which is 99 th percentile for age/sex

2.  Normal ascending thoracic aorta 3.6 cm

3.  CAD RADS 2 Poor opacification of vessels Study sent for FFR CT

[place]

## 2023-12-29 ENCOUNTER — Other Ambulatory Visit (HOSPITAL_COMMUNITY): Payer: Self-pay

## 2023-12-29 ENCOUNTER — Other Ambulatory Visit: Payer: Self-pay

## 2023-12-29 ENCOUNTER — Other Ambulatory Visit: Payer: Self-pay | Admitting: Family Medicine

## 2023-12-29 DIAGNOSIS — E1149 Type 2 diabetes mellitus with other diabetic neurological complication: Secondary | ICD-10-CM

## 2023-12-29 MED ORDER — PREGABALIN 100 MG PO CAPS
100.0000 mg | ORAL_CAPSULE | Freq: Two times a day (BID) | ORAL | 0 refills | Status: DC
  Filled 2023-12-29: qty 60, 30d supply, fill #0

## 2023-12-31 NOTE — Therapy (Deleted)
 OUTPATIENT PHYSICAL THERAPY CERVICAL/LUMBAR EVALUATION   Patient Name: Virginia Levine MRN: [id number] DOB:November 21, 1965, 58 y.o., female Today's Date: 12/31/2023  END OF SESSION:   Past Medical History:  Diagnosis Date   Ascending aorta dilation (HCC) 10/27/2021   Echocardiogram 10/2021: Ascending aorta 39 mm   Cardiomegaly 07/10/2021   Echocardiogram 10/2021: EF 60-65, no RWMA, mod LVH, Gr 1 DD, normal RVSF, normal PASP, severe LAE, mild MR, ascending aorta 39 mm   Coronary artery disease 07/24/2021   CCTA 2/23: CAC score 601 (99th percentile); pLAD 25-49, mLAD 1-24, pD1 25-49, pLCx 25-49, pOM1 1-24; FFR negative; Asc Aorta 3.6 cm; hepatic steatosis    Diabetes mellitus without complication (HCC)    Hyperlipidemia    Hypertension    Obesity    OSA (obstructive sleep apnea)    Pre-diabetes    Sleep apnea    Tobacco use    Vitamin D  deficiency 11/19/2022   Past Surgical History:  Procedure Laterality Date   LEEP  2018   Patient Active Problem List   Diagnosis Date Noted   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/03/2023   Diabetic neuropathy (HCC) 08/27/2023   Housing insecurity 04/04/2023   Right knee pain 04/04/2023   Alcohol use 12/13/2022   Vitamin D  deficiency 11/19/2022   Multilevel degenerative disc disease 02/19/2022   Ascending aorta dilation (HCC) 10/27/2021   Depression 08/30/2021   Fatigue 08/30/2021   Hepatic steatosis 08/01/2021   Coronary artery disease involving native coronary artery of native heart without angina pectoris 07/24/2021   Cardiomegaly 07/10/2021   Routine adult health maintenance 07/04/2021   Insomnia 04/13/2020   Flat foot 04/13/2020   Type 2 diabetes mellitus without complications (HCC) 02/14/2020   OSA (obstructive sleep apnea) 12/15/2019   Gastroesophageal reflux disease 03/16/2018   History of abnormal cervical Pap smear 11/26/2016   Other specified abnormal uterine and vaginal bleeding 03/24/2015   Rectal bleed 03/24/2015   Hyperlipidemia 01/11/2015    Obesity 01/21/2013   Tobacco abuse 01/21/2013   Essential hypertension 12/11/2012    PCP: Janna Ferrier, DO   REFERRING PROVIDER: Delores Suzann HERO, MD  REFERRING DIAG: M53.9 (ICD-10-CM) - Multilevel degenerative disc disease  THERAPY DIAG:  No diagnosis found.  Rationale for Evaluation and Treatment: Rehabilitation  ONSET DATE: chronic  SUBJECTIVE:                                                                                                                                                                                                         SUBJECTIVE STATEMENT: Bilateral leg and back pain: She experiences bilateral lower back  pain, currently rated as 5 out of 10, previously 10 out of 10. The pain worsens with prolonged sitting, common during her work shifts.  She describes her leg pain as improved.   She has lost 5 to 10 pounds recently, attributed to emotional stress following her nephew's unexpected death from a heart attack.    She is managing her weight and exploring nutritional support through [facility], preferring dietary changes over injectable medications. She previously tried Trulicity  injections. Hand dominance: {MISC; OT HAND [phone]}  PERTINENT HISTORY:  Chronic degenerative disc disease in the cervical and lumbar spine. Imaging shows degenerative changes without fractures or disc protrusion. Emphasized posture and muscle strength in symptom management. Discussed treatment options including medications, physical therapy, and lifestyle modifications. She prefers muscle relaxants and physical therapy. Surgery is a last resort due to low success rates. - Provided educational handout - Prescribe muscle relaxant to be taken before bed.  Encouraged conservative management with Tylenol /ibuprofen - Refer to physical therapy. - Patient to contact nutritionist for weight management, patient will let me know should she want to consider GLP-1s  PAIN:  Cervical Are  you having pain? Yes: NPRS scale: *** Pain location: *** Pain description: *** Aggravating factors: *** Relieving factors: ***  PAIN:  Lumbar Are you having pain? Yes: NPRS scale: *** Pain location: *** Pain description: *** Aggravating factors: *** Relieving factors: ***   PRECAUTIONS: None  RED FLAGS: None     WEIGHT BEARING RESTRICTIONS: No  FALLS:  Has patient fallen in last 6 months? No  OCCUPATION: ***  PLOF: Independent  PATIENT GOALS: To manage my pain  NEXT MD VISIT: TBD  OBJECTIVE:  Note: Objective measures were completed at Evaluation unless otherwise noted.  DIAGNOSTIC FINDINGS:  ***  PATIENT SURVEYS:  NDI:  NECK DISABILITY INDEX  Date: *** Score  Pain intensity {NDI-1:32931}  2. Personal care (washing, dressing, etc.) {NDI-2:32932}  3. Lifting {NDI-3:32933}  4. Reading {NDI-4:32934}  5. Headaches {NDI-5:32935}  6. Concentration {NDI-6:32936}  7. Work {NDI-7:32937}  8. Driving {WIP-1:67061}  9. Sleeping {NDI-9:32939}  10. Recreation {NDI-10:32940}  Total ***/50   Minimum Detectable Change (90% confidence): 5 points or 10% points   POSTURE: {posture:25561}  PALPATION: deferred   CERVICAL ROM:   {AROM/PROM:27142} ROM A/PROM (deg) eval  Flexion   Extension   Right lateral flexion   Left lateral flexion   Right rotation   Left rotation    (Blank rows = not tested)  UPPER EXTREMITY ROM:  {AROM/PROM:27142} ROM Right eval Left eval  Shoulder flexion    Shoulder extension    Shoulder abduction    Shoulder adduction    Shoulder extension    Shoulder internal rotation    Shoulder external rotation    Elbow flexion    Elbow extension    Wrist flexion    Wrist extension    Wrist ulnar deviation    Wrist radial deviation    Wrist pronation    Wrist supination     (Blank rows = not tested)  UPPER EXTREMITY MMT:  MMT Right eval Left eval  Shoulder flexion    Shoulder extension    Shoulder abduction    Shoulder  adduction    Shoulder extension    Shoulder internal rotation    Shoulder external rotation    Middle trapezius    Lower trapezius    Elbow flexion    Elbow extension    Wrist flexion    Wrist extension    Wrist ulnar deviation  Wrist radial deviation    Wrist pronation    Wrist supination    Grip strength     (Blank rows = not tested) LUMBAR ROM:   {AROM/PROM:27142}  A/PROM  eval  Flexion   Extension   Right lateral flexion   Left lateral flexion   Right rotation   Left rotation    (Blank rows = not tested)   LOWER EXTREMITY MMT:    MMT Right eval Left eval  Hip flexion    Hip extension    Hip abduction    Hip adduction    Hip internal rotation    Hip external rotation    Knee flexion    Knee extension    Ankle dorsiflexion    Ankle plantarflexion    Ankle inversion    Ankle eversion     (Blank rows = not tested)   LOWER EXTREMITY ROM:     {AROM/PROM:27142}  Right eval Left eval  Hip flexion    Hip extension    Hip abduction    Hip adduction    Hip internal rotation    Hip external rotation    Knee flexion    Knee extension    Ankle dorsiflexion    Ankle plantarflexion    Ankle inversion    Ankle eversion     (Blank rows = not tested)  CERVICAL SPECIAL TESTS:  Spurling's test: {pos/neg:25243}  LUMBAR SPECIAL TESTS:  Straight leg raise test: {pos/neg:25243} and Slump test: {pos/neg:25243}   FUNCTIONAL TESTS:  30 seconds chair stand test  TREATMENT:                                                                                                                             [facility] Adult PT Treatment:                                                DATE: *** Eval and HEP Self Care: Additional minutes spent for educating on updated Therapeutic Home Exercise Program as well as comparing current status to condition at start of symptoms. This included exercises focusing on stretching, strengthening, with focus on eccentric aspects. Long term goals  include an improvement in range of motion, strength, endurance as well as avoiding reinjury. Patient's frequency would include in 1-2 times a day, 3-5 times a week for a duration of 6-12 weeks. Proper technique shown and discussed handout in great detail. All questions were discussed and addressed.       PATIENT EDUCATION:  Education details: Discussed eval findings, rehab rationale and POC and patient is in agreement  Person educated: Patient Education method: Explanation and Handouts Education comprehension: verbalized understanding and needs further education  HOME EXERCISE PROGRAM: ***  ASSESSMENT:  CLINICAL IMPRESSION: Patient is a *** y.o. *** who was seen today for physical therapy evaluation  and treatment for ***.   OBJECTIVE IMPAIRMENTS: Abnormal gait, cardiopulmonary status limiting activity, decreased activity tolerance, decreased endurance, decreased knowledge of condition, decreased mobility, difficulty walking, decreased strength, increased fascial restrictions, impaired perceived functional ability, postural dysfunction, obesity, and pain.   ACTIVITY LIMITATIONS: carrying, lifting, bending, sitting, standing, squatting, and stairs  PERSONAL FACTORS: Age, Fitness, and Time since onset of injury/illness/exacerbation are also affecting patient's functional outcome.   REHAB POTENTIAL: Fair based on chronicity and body habitus  CLINICAL DECISION MAKING: Stable/uncomplicated  EVALUATION COMPLEXITY: Low   GOALS: Goals reviewed with patient? No  SHORT TERM GOALS: Target date: ***  *** Baseline:  Goal status: INITIAL  2.  *** Baseline:  Goal status: INITIAL  3.  *** Baseline:  Goal status: INITIAL  4.  *** Baseline:  Goal status: INITIAL  5.  *** Baseline:  Goal status: INITIAL  6.  *** Baseline:  Goal status: INITIAL  LONG TERM GOALS: Target date: ***  *** Baseline:  Goal status: INITIAL  2.  *** Baseline:  Goal status: INITIAL  3.   *** Baseline:  Goal status: INITIAL  4.  *** Baseline:  Goal status: INITIAL  5.  *** Baseline:  Goal status: INITIAL  6.  *** Baseline:  Goal status: INITIAL   PLAN:  PT FREQUENCY: 1-2x/week  PT DURATION: 6 weeks  PLANNED INTERVENTIONS: 97110-Therapeutic exercises, 97530- Therapeutic activity, [id number]- Neuromuscular re-education, 97535- Self Care, 02859- Manual therapy, and 97116- Gait training  PLAN FOR NEXT SESSION: HEP review and update, manual techniques as appropriate, aerobic tasks, ROM and flexibility activities, strengthening and PREs, TPDN, gait and balance training as needed     Reyes CHRISTELLA Kohut, PT 12/31/2023, 4:22 PM

## 2024-01-02 ENCOUNTER — Ambulatory Visit: Attending: Family Medicine

## 2024-01-08 NOTE — Progress Notes (Deleted)
    SUBJECTIVE:   CHIEF COMPLAINT / HPI:   Background - Marital status: - Occupation: - Children: - Grandchildren: - Family Medical Hx: - Pets: - Religious/Personal Beliefs: - Interests/Hobbies:   PERTINENT  PMH / PSH: CAD, T2DM, HLD, HTN, OSA  OBJECTIVE:   LMP 11/21/2012   General: Awake and Alert in NAD HEENT: NCAT. Sclera anicteric. No rhinorrhea. Cardiovascular: RRR. No M/R/G Respiratory: CTAB, normal WOB on RA. No wheezing, crackles, rhonchi, or diminished breath sounds. Abdomen: Soft, non-tender, non-distended. Bowel sounds normoactive/hypoactive/hyperactive. *** Extremities: Able to move all extremities. No BLE edema, no deformities or significant joint findings. Skin: Warm and dry. No abrasions or rashes noted. Neuro: A&Ox***. No focal neurological deficits.  ASSESSMENT/PLAN:   Assessment & Plan      [place] Melena, DO [facility]

## 2024-01-09 ENCOUNTER — Ambulatory Visit: Admitting: Family Medicine

## 2024-01-12 ENCOUNTER — Other Ambulatory Visit (HOSPITAL_COMMUNITY): Payer: Self-pay

## 2024-01-13 DIAGNOSIS — Z419 Encounter for procedure for purposes other than remedying health state, unspecified: Secondary | ICD-10-CM | POA: Diagnosis not present

## 2024-01-16 ENCOUNTER — Ambulatory Visit: Admitting: Student

## 2024-01-23 ENCOUNTER — Ambulatory Visit (INDEPENDENT_AMBULATORY_CARE_PROVIDER_SITE_OTHER): Admitting: Family Medicine

## 2024-01-23 ENCOUNTER — Other Ambulatory Visit (HOSPITAL_COMMUNITY): Payer: Self-pay

## 2024-01-23 VITALS — BP 168/97 | HR 88 | Ht 67.0 in | Wt 302.2 lb

## 2024-01-23 DIAGNOSIS — G5793 Unspecified mononeuropathy of bilateral lower limbs: Secondary | ICD-10-CM | POA: Diagnosis not present

## 2024-01-23 DIAGNOSIS — E1149 Type 2 diabetes mellitus with other diabetic neurological complication: Secondary | ICD-10-CM | POA: Diagnosis not present

## 2024-01-23 DIAGNOSIS — G4700 Insomnia, unspecified: Secondary | ICD-10-CM

## 2024-01-23 MED ORDER — DULOXETINE HCL 60 MG PO CPEP
60.0000 mg | ORAL_CAPSULE | Freq: Every day | ORAL | 3 refills | Status: DC
  Filled 2024-01-23: qty 30, 30d supply, fill #0
  Filled 2024-02-17: qty 30, 30d supply, fill #1
  Filled 2024-03-16: qty 30, 30d supply, fill #2
  Filled 2024-03-29: qty 30, 30d supply, fill #3

## 2024-01-23 MED ORDER — MELATONIN 5 MG PO TABS
5.0000 mg | ORAL_TABLET | Freq: Every evening | ORAL | 1 refills | Status: DC | PRN
  Filled 2024-01-23: qty 30, 30d supply, fill #0
  Filled 2024-02-17: qty 30, 30d supply, fill #1

## 2024-01-23 NOTE — Patient Instructions (Signed)
 I will let you know if any results from today are abnormal or require treatment  Please start taking Cymbalta  60 mg daily for your neuropathy.  I hope this helps  I have placed a referral to neurology for further evaluation.  You should receive a call within the next month to schedule this  I have sent a refill of your melatonin

## 2024-01-23 NOTE — Progress Notes (Signed)
    SUBJECTIVE:   CHIEF COMPLAINT / HPI:   Neuropathy On Lyrica  100 mg twice daily X2 months has been having worsening neuropathy in b/l lower legs and feet Interested in trying new med and seeing neurologist Has not tried PT - not interested in this Denies wounds, loss of sensation  Also requesting refill on melatonin for sleep, previously worked well for her  PERTINENT  PMH / PSH: DM, neuropathy, insomnia, HTN  OBJECTIVE:   Ht 5' 7 (1.702 m)   Wt (!) 302 lb 4 oz (137.1 kg)   LMP 11/21/2012   BMI 47.34 kg/m   General: NAD, pleasant, able to participate in exam Respiratory: No respiratory distress Skin: warm and dry, no rashes noted Psych: Normal affect and mood Extremities: b/l lower extremities with 5/5 strength and intact sensation. Gait normal  ASSESSMENT/PLAN:   Assessment & Plan Neuropathy involving both lower extremities Other diabetic neurological complication associated with type 2 diabetes mellitus (HCC) Refractory to Lyrica  100 mg twice daily Trial Cymbalta  60 mg daily Check B12, folate, HIV, RPR Placed neurology referral for further evaluation, consider nerve conduction studies Insomnia, unspecified type Sent refill of melatonin 5 mg   Aris Even, MD [facility]

## 2024-01-23 NOTE — Assessment & Plan Note (Signed)
 Sent refill of melatonin 5 mg

## 2024-01-23 NOTE — Assessment & Plan Note (Signed)
 Refractory to Lyrica  100 mg twice daily Trial Cymbalta  60 mg daily Check B12, folate, HIV, RPR Placed neurology referral for further evaluation, consider nerve conduction studies

## 2024-01-24 LAB — HIV ANTIBODY (ROUTINE TESTING W REFLEX): HIV Screen 4th Generation wRfx: NONREACTIVE

## 2024-01-24 LAB — VITAMIN B12: Vitamin B-12: 313 pg/mL (ref 232–1245)

## 2024-01-24 LAB — RPR: RPR Ser Ql: NONREACTIVE

## 2024-01-24 LAB — FOLATE: Folate: 4.6 ng/mL (ref >3.0)

## 2024-01-26 ENCOUNTER — Ambulatory Visit (HOSPITAL_COMMUNITY): Payer: Self-pay | Admitting: Family Medicine

## 2024-02-13 DIAGNOSIS — Z419 Encounter for procedure for purposes other than remedying health state, unspecified: Secondary | ICD-10-CM | POA: Diagnosis not present

## 2024-02-13 DIAGNOSIS — E119 Type 2 diabetes mellitus without complications: Secondary | ICD-10-CM | POA: Diagnosis not present

## 2024-02-13 DIAGNOSIS — Z7689 Persons encountering health services in other specified circumstances: Secondary | ICD-10-CM | POA: Diagnosis not present

## 2024-02-17 ENCOUNTER — Other Ambulatory Visit (HOSPITAL_COMMUNITY): Payer: Self-pay

## 2024-02-23 ENCOUNTER — Ambulatory Visit: Admitting: Neurology

## 2024-03-05 ENCOUNTER — Other Ambulatory Visit: Payer: Self-pay

## 2024-03-05 ENCOUNTER — Other Ambulatory Visit (HOSPITAL_COMMUNITY): Payer: Self-pay

## 2024-03-05 ENCOUNTER — Ambulatory Visit (INDEPENDENT_AMBULATORY_CARE_PROVIDER_SITE_OTHER): Admitting: Student

## 2024-03-05 VITALS — BP 188/112 | HR 74 | Wt 301.0 lb

## 2024-03-05 DIAGNOSIS — I10 Essential (primary) hypertension: Secondary | ICD-10-CM

## 2024-03-05 DIAGNOSIS — G4700 Insomnia, unspecified: Secondary | ICD-10-CM | POA: Diagnosis not present

## 2024-03-05 MED ORDER — QUETIAPINE FUMARATE 25 MG PO TABS
25.0000 mg | ORAL_TABLET | Freq: Every day | ORAL | 0 refills | Status: DC
  Filled 2024-03-05: qty 30, 30d supply, fill #0

## 2024-03-05 NOTE — Assessment & Plan Note (Addendum)
 BP elevated x2 today. Currently asymptomatic. Patient says her BP are usually well controlled. Inconsistent with current medication Hyzaar  100-25mg  -Advised patient on medication adherence  -Continue current regime -Encourage to keep a daily BP log  -Follow up in 3 weeks if elevated can add a second med or consider referral to Dr. Koval

## 2024-03-05 NOTE — Patient Instructions (Addendum)
 Pleasure to meet you today  As discussed I recommend discontinuing alcohol use as this could be impacting your sleep.  Continue to take your Cymbalta  and I have also added Seroquel 25 mg which you should take in the morning after work to help with your sleep.  Below is a list of therapist/counselors who are covered under Medicaid.  Please call them to set up an appointment to start counseling  Your blood pressure today was elevated please make sure to check your blood pressure daily, take your medications daily and keep a daily log of your blood pressure so that we can reassess in 3 weeks..   Therapy and Counseling Resources Most providers on this list will take Medicaid. Patients with commercial insurance or Medicare should contact their insurance company to get a list of in network providers.  [facility] (takes children) Location 1: [address] [place], [place] [address] Location 2: [address] [place], [place] [address] [phone]   [facility] (spanish speaking therapist available)(habla espanol)(take medicare and medicaid)  [address], [place], [place] [address], [place]  [email or web address] [phone]  [facility] [address] [place], [place] [address] [phone]  [facility]   [address] [place], [place] [address]      [phone]  [facility] (spanish available) [address]  [place], [place] [address] [phone]  [facility] (take [facility] and medicare) [address]  [place], [place] [address]       [phone]     [facility] (virtual only) [phone]  Janit Griffins [facility] [address], [place], [place] [phone]  [facility]:  [address] [place] [phone]  [facility]:  [address] [place] [phone]  [facility]  (under & uninsured) [address]   [place] [phone]    [email or web address]    [facility] [address]  [place]    [phone]  [facility] -[address]     Phone:  [phone]     [facility]-  [address]  [phone]   [facility] [address]      [place], [place] [phone] ext [phone]  [facility]: [address] [place], [place], [place]  [phone]   [facility] (Spanish therapist) [email or web address]  [address] [place]  [address]   [place] [address]    [phone]    [facility]   [address],  [place], [place]  [phone]   [facility]  [address] [place]  [phone]  [facility]  [address] [place], [place]        [phone]  Open Access/Walk In Clinic under & uninsured  [facility]  [address] [place], [place] [phone] Crisis [phone]  [facility] [address],  (Spanish)   [address] , [place]: [phone]) 8:30 - 12; 1 - 2:30  [facility] [employer],  [address] [place], [place]    [phone]):8:30 - 12; 2 - 3PM  [facility] [employer],  [address]  [place]; [phone]):   Mon - Fri 8 AM - 5 PM  [facility] [address] [place]  MWF 12:30 to 3:00 or  call to schedule an appointment  [phone]  Specific Provider options Psychology Today  [email or web address]/us  click on find a therapist  enter your zip code left side and select or tailor a therapist for your specific need.   [facility] Provider Directory [email or web address]  (Medicaid)   Follow all drop down to find a provider  Social Support program [facility] [phone] or  [employer] [address], [place], [place] Recovery support and educational   24- Hour Availability:   [facility]  [address] [place], [place] [phone] Crisis [phone]  [facility] [phone]  [place] [facility]  [phone]   [facility]  [phone] (after hours)  Therapeutic Alternative/Mobile Crisis   [phone]  [place]  National Suicide Hotline  (380)553-2653 MERRILYN)  Call 911 or go to emergency room  [facility]  [phone]);  [place] and [employer]  [phone]); [place], [place], [place], [place], [place], [place], [place]

## 2024-03-05 NOTE — Assessment & Plan Note (Signed)
 Depression appears to be chronic. No active SI/HI. While social stressors seems to be a huge factor, her daily alcohol consumption could be impacting her insomnia and worsening her symptoms. Given that she's currently on Cymbalta  (for neuropathy) will avoid SSRI or SNRI to reduce risk of serotonin syndrome. Will continue her Cymbalta  and start patient on low dose Seroquel 25 mg at bedtime to help her sleep. -Continue Cymbalta  -Rx seroquel 25mg  at bedtime -Encouraged alcohol cessation -Provided patient with list of Medicaid counselors/ therapist -Follow up in 3 weeks to ensure she's established with a counselor and response with plan.

## 2024-03-05 NOTE — Progress Notes (Addendum)
    SUBJECTIVE:   CHIEF COMPLAINT / HPI:   58 year old female with history of depression presenting for depression follow-up Patient reports she has continued to have depressions Reports major life stressors has been reason for worsening depression in the last few months  Identified a loss of apartment last year, nephew passed in April 2025 and work stress as major stressor in life. Also identify not having family around with most of them in [place]  as major stressor Endorses difficulty with endorses difficulty with sleep Drinks at least 2 cans of beer every morning before sleep because she works night shift Denies any illicit drug use. No SI/HI.  Currently on Cymbalta  for diabetic neuropathy.   PERTINENT  PMH / PSH: Reviewed   OBJECTIVE:   BP (!) 188/112   Pulse 74   Wt (!) 301 lb (136.5 kg)   LMP 11/21/2012   SpO2 100%   BMI 47.14 kg/m       03/05/2024    9:00 AM 11/25/2023    8:22 AM 10/20/2023    9:05 AM  Depression screen PHQ 2/9  Decreased Interest 0 0 0  Down, Depressed, Hopeless 3 1 0  PHQ - 2 Score 3 1 0  Altered sleeping 3 3 3   Tired, decreased energy 2 3 3   Change in appetite 1 1 2   Feeling bad or failure about yourself  1 0 0  Trouble concentrating 0 0 0  Moving slowly or fidgety/restless 0 0 0  Suicidal thoughts 0 0 0  PHQ-9 Score 10 8 8   Difficult doing work/chores Somewhat difficult      Physical Exam General: Alert, well appearing, NAD Cardiovascular: RRR, No Murmurs, Normal S2/S2 Respiratory: CTAB, No wheezing or [place] Psych: Normal affect, good judgement and cooperative   ASSESSMENT/PLAN:   Essential hypertension BP elevated x2 today. Currently asymptomatic. Patient says her BP are usually well controlled. Inconsistent with current medication Hyzaar  100-25mg  -Advised patient on medication adherence  -Continue current regime -Encourage to keep a daily BP log  -Follow up in 3 weeks if elevated can add a second med or consider referral to Dr.  Koval  Depression Depression appears to be chronic. No active SI/HI. While social stressors seems to be a huge factor, her daily alcohol consumption could be impacting her insomnia and worsening her symptoms. Given that she's currently on Cymbalta  (for neuropathy) will avoid SSRI or SNRI to reduce risk of serotonin syndrome. Will continue her Cymbalta  and start patient on low dose Seroquel 25 mg at bedtime to help her sleep. -Continue Cymbalta  -Rx seroquel 25mg  at bedtime -Encouraged alcohol cessation -Provided patient with list of Medicaid counselors/ therapist -Follow up in 3 weeks to ensure she's established with a counselor and response with plan.    \ Norleen April, MD [facility]

## 2024-03-14 DIAGNOSIS — Z419 Encounter for procedure for purposes other than remedying health state, unspecified: Secondary | ICD-10-CM | POA: Diagnosis not present

## 2024-03-16 ENCOUNTER — Other Ambulatory Visit (HOSPITAL_COMMUNITY): Payer: Self-pay

## 2024-03-16 ENCOUNTER — Other Ambulatory Visit: Payer: Self-pay | Admitting: Family Medicine

## 2024-03-16 DIAGNOSIS — G4700 Insomnia, unspecified: Secondary | ICD-10-CM

## 2024-03-16 MED ORDER — MELATONIN 5 MG PO TABS
5.0000 mg | ORAL_TABLET | Freq: Every evening | ORAL | 1 refills | Status: DC | PRN
Start: 2024-03-16 — End: 2024-03-29
  Filled 2024-03-16: qty 30, 30d supply, fill #0

## 2024-03-17 ENCOUNTER — Other Ambulatory Visit: Payer: Self-pay

## 2024-03-18 DIAGNOSIS — H25813 Combined forms of age-related cataract, bilateral: Secondary | ICD-10-CM | POA: Diagnosis not present

## 2024-03-18 DIAGNOSIS — Z7689 Persons encountering health services in other specified circumstances: Secondary | ICD-10-CM | POA: Diagnosis not present

## 2024-03-22 ENCOUNTER — Other Ambulatory Visit (HOSPITAL_COMMUNITY): Payer: Self-pay

## 2024-03-22 ENCOUNTER — Telehealth: Payer: Self-pay | Admitting: Pharmacist

## 2024-03-22 NOTE — Telephone Encounter (Signed)
 Attempted to contact patient for follow-up of diabetes control and pending labs. Needs UACR and repeat A1c.    Left HIPAA compliant voice mail requesting call back to direct phone: [phone]  Total time with patient call and documentation of interaction: 4 minutes.

## 2024-03-29 ENCOUNTER — Other Ambulatory Visit (HOSPITAL_BASED_OUTPATIENT_CLINIC_OR_DEPARTMENT_OTHER): Payer: Self-pay

## 2024-03-29 ENCOUNTER — Other Ambulatory Visit: Payer: Self-pay

## 2024-03-29 ENCOUNTER — Other Ambulatory Visit (HOSPITAL_COMMUNITY): Payer: Self-pay

## 2024-03-29 ENCOUNTER — Other Ambulatory Visit: Payer: Self-pay | Admitting: Family Medicine

## 2024-03-29 DIAGNOSIS — G4700 Insomnia, unspecified: Secondary | ICD-10-CM

## 2024-03-29 MED ORDER — MELATONIN 5 MG PO TABS
10.0000 mg | ORAL_TABLET | Freq: Every evening | ORAL | 1 refills | Status: DC | PRN
  Filled 2024-03-29: qty 30, 15d supply, fill #0
  Filled 2024-04-22 – 2024-05-11 (×2): qty 30, 15d supply, fill #1

## 2024-03-30 ENCOUNTER — Other Ambulatory Visit: Payer: Self-pay

## 2024-03-30 ENCOUNTER — Other Ambulatory Visit: Payer: Self-pay | Admitting: Family Medicine

## 2024-03-30 ENCOUNTER — Other Ambulatory Visit (HOSPITAL_COMMUNITY): Payer: Self-pay

## 2024-03-30 MED ORDER — OFLOXACIN 0.3 % OP SOLN
OPHTHALMIC | 1 refills | Status: DC
  Filled 2024-03-30: qty 5, 7d supply, fill #0

## 2024-03-30 MED ORDER — ALBUTEROL SULFATE HFA 108 (90 BASE) MCG/ACT IN AERS
2.0000 | INHALATION_SPRAY | Freq: Four times a day (QID) | RESPIRATORY_TRACT | 2 refills | Status: AC | PRN
  Filled 2024-03-30: qty 18, 25d supply, fill #0

## 2024-03-30 MED ORDER — PREDNISOLONE ACETATE 1 % OP SUSP
OPHTHALMIC | 2 refills | Status: AC
  Filled 2024-03-30: qty 5, 28d supply, fill #0
  Filled 2024-04-22 – 2024-06-08 (×2): qty 5, 28d supply, fill #1

## 2024-04-02 ENCOUNTER — Encounter: Payer: Self-pay | Admitting: Diagnostic Neuroimaging

## 2024-04-02 ENCOUNTER — Ambulatory Visit: Admitting: Diagnostic Neuroimaging

## 2024-04-12 ENCOUNTER — Telehealth: Payer: Self-pay | Admitting: Diagnostic Neuroimaging

## 2024-04-12 NOTE — Telephone Encounter (Signed)
 LVM and sent mychart msg informing pt of need to reschedule 04/28/25 appt - MD out

## 2024-04-14 ENCOUNTER — Ambulatory Visit: Admitting: Family Medicine

## 2024-04-16 DIAGNOSIS — H25811 Combined forms of age-related cataract, right eye: Secondary | ICD-10-CM | POA: Diagnosis not present

## 2024-04-22 ENCOUNTER — Ambulatory Visit (INDEPENDENT_AMBULATORY_CARE_PROVIDER_SITE_OTHER): Admitting: Diagnostic Neuroimaging

## 2024-04-22 ENCOUNTER — Other Ambulatory Visit: Payer: Self-pay | Admitting: Student

## 2024-04-22 ENCOUNTER — Other Ambulatory Visit: Payer: Self-pay

## 2024-04-22 ENCOUNTER — Other Ambulatory Visit (HOSPITAL_COMMUNITY): Payer: Self-pay

## 2024-04-22 ENCOUNTER — Encounter: Payer: Self-pay | Admitting: Diagnostic Neuroimaging

## 2024-04-22 VITALS — BP 152/90 | HR 86 | Ht 67.0 in | Wt 299.4 lb

## 2024-04-22 DIAGNOSIS — M5442 Lumbago with sciatica, left side: Secondary | ICD-10-CM | POA: Diagnosis not present

## 2024-04-22 DIAGNOSIS — M5441 Lumbago with sciatica, right side: Secondary | ICD-10-CM | POA: Diagnosis not present

## 2024-04-22 DIAGNOSIS — G4700 Insomnia, unspecified: Secondary | ICD-10-CM

## 2024-04-22 DIAGNOSIS — M79672 Pain in left foot: Secondary | ICD-10-CM

## 2024-04-22 DIAGNOSIS — R269 Unspecified abnormalities of gait and mobility: Secondary | ICD-10-CM | POA: Diagnosis not present

## 2024-04-22 DIAGNOSIS — M79671 Pain in right foot: Secondary | ICD-10-CM

## 2024-04-22 DIAGNOSIS — G8929 Other chronic pain: Secondary | ICD-10-CM

## 2024-04-22 NOTE — Patient Instructions (Signed)
  NUMBNESS / PAIN (feet, legs; ddx: neuropathy (diabetes, obesity), lumbar radiculopathy / spinal stenosis, musculoskeletal pain associated with obesity) - continue duloxetine  60mg  daily - continue medical mgmt of diabetes and weight mgmt (patient interested in GLP1 agonist therapy; discuss with PCP) - avoid tobacco / alcohol - consider OTC capsaicin cream, lidocaine  patch / cream, alpha-lipoic acid 600mg  daily  NUMBNESS / PAIN (hands; possible intermittent compression neuropathy at wrists i.e. carpal tunnel syndrome) - resolved; monitor for now

## 2024-04-22 NOTE — Progress Notes (Signed)
 [facility]  PATIENT: Virginia Levine DOB: 12-Jan-1966  REFERRING CLINICIAN: Donah Laymon PARAS, MD HISTORY FROM: patient  REASON FOR VISIT: new consult   HISTORICAL  CHIEF COMPLAINT:  Chief Complaint  Patient presents with   RM 6     Patient is here alone for Lower extremity neuropathy - is here for an evaluation gets pain from her ankle and lower legs ( both legs)     HISTORY OF PRESENT ILLNESS:   58 year old female here for evaluation of lower extremity numbness and pain.  History of diabetes and BMI 46.   2023 patient had onset of pain and numbness in her thighs radiating down to her calves.  Symptoms peaked within 1 week and then persisted.  Over time pain in thighs has improved.  Still having some numbness and tingling in the feet.  Over the past 8 months also had some numbness in her hands which has now improved.  Patient has chronic low back pain and chronic wrist pain.  She self diagnosed herself with carpal tunnel syndrome but has never been officially tested for this.  She tried medications including gabapentin , Lyrica  and duloxetine  without relief.   REVIEW OF SYSTEMS: Full 14 system review of systems performed and negative with exception of: as per HPI.  ALLERGIES: Allergies  Allergen Reactions   Lisinopril  Other (See Comments)    Angioedema / Hair loss/burned scalp    HOME MEDICATIONS: Outpatient Medications Prior to Visit  Medication Sig Dispense Refill   albuterol  (VENTOLIN  HFA) 108 (90 Base) MCG/ACT inhaler Inhale 2 puffs into the lungs every 6 (six) hours as needed for wheezing or shortness of breath. 18 g 2   aspirin  EC 81 MG tablet Take 1 tablet (81 mg total) by mouth daily. 90 tablet 2   atorvastatin  (LIPITOR) 40 MG tablet Take 1 tablet (40 mg total) by mouth daily. 90 tablet 3   carbamide peroxide (DEBROX) 6.5 % OTIC solution Place 5 drops into both ears 2 (two) times daily. 15 mL 0   carvedilol  (COREG ) 25 MG tablet Take 1.5  tablets (37.5 mg total) by mouth 2 (two) times daily with a meal. 180 tablet 5   DULoxetine  (CYMBALTA ) 60 MG capsule Take 1 capsule (60 mg total) by mouth daily. 30 capsule 3   fluticasone  (FLONASE ) 50 MCG/ACT nasal spray Place 1 spray into both nostrils daily. 16 g 12   losartan -hydrochlorothiazide  (HYZAAR ) 100-25 MG tablet Take 1 tablet by mouth daily. 90 tablet 3   melatonin 5 MG TABS Take 2 tablets (10 mg total) by mouth at bedtime as needed (For sleep). 30 tablet 1   ofloxacin  (OCUFLOX ) 0.3 % ophthalmic solution instill 1 drop by ophthalmic route 4 times every day into right eye for 1 week 2.5 mL 1   prednisoLONE  acetate (PRED FORTE ) 1 % ophthalmic suspension instill 1 drop into right eye 4 times a day for 1 wk, then 2  times a day for 1 wk, then daily for 2 wks then STOP 5 mL 2   Vitamin D , Ergocalciferol , (DRISDOL ) 1.25 MG (50000 UNIT) CAPS capsule Take 1 capsule (50,000 Units total) by mouth every 7 (seven) days. 8 capsule 0   Dulaglutide  (TRULICITY ) 1.5 MG/0.5ML SOAJ Inject 1.5 mg into the skin once a week. (Patient not taking: Reported on 04/22/2024) 2 mL 3   pregabalin  (LYRICA ) 100 MG capsule Take 1 capsule (100 mg total) by mouth 2 (two) times daily. (Patient not taking: Reported on 04/22/2024) 60 capsule 0  QUEtiapine  (SEROQUEL ) 25 MG tablet Take 1 tablet (25 mg total) by mouth at bedtime. (Patient not taking: Reported on 04/22/2024) 30 tablet 0   tiZANidine  (ZANAFLEX ) 4 MG tablet Take 1 tablet (4 mg total) by mouth at bedtime. 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   Echocardiogram 10/2021: Ascending aorta 39 mm   Cardiomegaly 07/10/2021   Echocardiogram 10/2021: EF  60-65, no RWMA, mod LVH, Gr 1 DD, normal RVSF, normal PASP, severe LAE, mild MR, ascending aorta 39 mm   Coronary artery disease 07/24/2021   CCTA 2/23: CAC score 601 (99th percentile); pLAD 25-49, mLAD 1-24, pD1 25-49, pLCx 25-49, pOM1 1-24; FFR negative; Asc Aorta 3.6 cm; hepatic steatosis    Diabetes mellitus without complication (HCC)    Hyperlipidemia    Hypertension    Obesity    OSA (obstructive sleep apnea)    Pre-diabetes    Sleep apnea    Tobacco use    Vitamin D  deficiency 11/19/2022    PAST SURGICAL HISTORY: Past Surgical History:  Procedure Laterality Date   LEEP  2018    FAMILY HISTORY: Family History  Problem Relation Age of Onset   Leukemia Mother 82   Diabetes Mother    Heart attack Father        died at age 5   Drug abuse Sister    Aneurysm Sister    COPD Sister    Aneurysm Sister    Kidney failure Brother    Hypertension Brother    Diabetes Maternal Grandmother    Colon cancer Neg Hx    Colon polyps Neg Hx     SOCIAL HISTORY: Social History   Socioeconomic History   Marital status: Single    Spouse name: Not on file   Number of children: 0   Years of education: Not on file   Highest education level: Some college, no degree  Occupational History   Not on file  Tobacco Use   Smoking status: Light Smoker    Current packs/day: 0.50    Types: Cigarettes    Passive exposure: Current   Smokeless tobacco: Never   Tobacco comments:    smokes with drinking more (once a month), approx 3 cigarettes per day.   Vaping Use   Vaping status: Never Used  Substance and Sexual Activity   Alcohol use: Yes    Comment: occasional   Drug use: No   Sexual activity: Never  Other Topics Concern   Not on file  Social History Narrative   No caffeine    Social Drivers of Corporate Investment Banker Strain: Not on file  Food Insecurity: No Food Insecurity (11/19/2022)   Hunger Vital Sign    Worried About Running Out of Food in the Last Year: Never true     Ran Out of Food in the Last Year: Never true  Transportation Needs: No Transportation Needs (11/19/2022)   PRAPARE - Administrator, Civil Service (Medical): No    Lack of Transportation (Non-Medical): No  Physical Activity: Not on file  Stress: Not on file  Social Connections: Not on file  Intimate Partner Violence: Not At Risk (  11/19/2022)   Humiliation, Afraid, Rape, and Kick questionnaire    Fear of Current or Ex-Partner: No    Emotionally Abused: No    Physically Abused: No    Sexually Abused: No     PHYSICAL EXAM  GENERAL EXAM/CONSTITUTIONAL: Vitals:  Vitals:   04/22/24 0833  BP: (!) 152/90  Pulse: 86  SpO2: 99%  Weight: 299 lb 6.4 oz (135.8 kg)  Height: 5' 7 (1.702 m)   Body mass index is 46.89 kg/m. Wt Readings from Last 3 Encounters:  04/22/24 299 lb 6.4 oz (135.8 kg)  03/05/24 (!) 301 lb (136.5 kg)  01/23/24 (!) 302 lb 4 oz (137.1 kg)   Patient is in no distress; well developed, nourished and groomed; neck is supple  CARDIOVASCULAR: Examination of carotid arteries is normal; no carotid bruits Regular rate and rhythm, no murmurs Examination of peripheral vascular system by observation and palpation is normal  EYES: Ophthalmoscopic exam of optic discs and posterior segments is normal; no papilledema or hemorrhages No results found.  MUSCULOSKELETAL: Gait, strength, tone, movements noted in Neurologic exam below  NEUROLOGIC: MENTAL STATUS:      No data to display         awake, alert, oriented to person, place and time recent and remote memory intact normal attention and concentration language fluent, comprehension intact, naming intact fund of knowledge appropriate  CRANIAL NERVE:  2nd - no papilledema on fundoscopic exam 2nd, 3rd, 4th, 6th - pupils equal and reactive to light, visual fields full to confrontation, extraocular muscles intact, no nystagmus 5th - facial sensation symmetric 7th - facial strength symmetric 8th -  hearing intact 9th - palate elevates symmetrically, uvula midline 11th - shoulder shrug symmetric 12th - tongue protrusion midline  MOTOR:  normal bulk and tone, full strength in the BUE, BLE  SENSORY:  normal and symmetric to light touch, temperature, vibration  COORDINATION:  finger-nose-finger, fine finger movements normal  REFLEXES:  deep tendon reflexes 1+ and symmetric  GAIT/STATION:  narrow based gait; SLIGHTLY ANTALGIC     DIAGNOSTIC DATA (LABS, IMAGING, TESTING) - I reviewed patient records, labs, notes, testing and imaging myself where available.  Lab Results  Component Value Date   WBC 8.8 08/05/2023   HGB 13.9 08/05/2023   HCT 40.5 08/05/2023   MCV 95 08/05/2023   PLT 220 08/05/2023      Component Value Date/Time   NA 145 (H) 11/03/2023 1043   K 4.1 11/03/2023 1043   CL 104 11/03/2023 1043   CO2 25 11/03/2023 1043   GLUCOSE 112 (H) 11/03/2023 1043   GLUCOSE 186 (H) 11/20/2022 0421   BUN 11 11/03/2023 1043   CREATININE 0.64 11/03/2023 1043   CREATININE 0.67 12/11/2012 1607   CALCIUM  9.7 11/03/2023 1043   PROT 6.7 11/03/2023 1043   ALBUMIN 4.5 11/03/2023 1043   AST 20 11/03/2023 1043   ALT 17 11/03/2023 1043   ALKPHOS 112 11/03/2023 1043   BILITOT 0.8 11/03/2023 1043   GFRNONAA >60 11/20/2022 0421   GFRAA 103 02/25/2020 1603   Lab Results  Component Value Date   CHOL 138 08/05/2023   HDL 38 (L) 08/05/2023   LDLCALC 73 08/05/2023   TRIG 159 (H) 08/05/2023   CHOLHDL 3.6 08/05/2023   Lab Results  Component Value Date   HGBA1C 6.5 08/05/2023   Lab Results  Component Value Date   VITAMINB12 313 01/23/2024   Lab Results  Component Value Date   TSH 0.720 08/30/2021     ASSESSMENT  AND PLAN  58 y.o. year old female here with:  Meds tried: gabapentin , pregabalin , duloxetine   Dx:  1. Pain in both feet   2. Gait difficulty   3. Chronic bilateral low back pain with bilateral sciatica     PLAN:  NUMBNESS / PAIN (feet, legs; ddx:  neuropathy (diabetes, obesity), lumbar radiculopathy / spinal stenosis, musculoskeletal pain associated with obesity) - continue duloxetine  60mg  daily - continue medical mgmt of diabetes and weight mgmt (patient interested in GLP1 agonist therapy; discuss with PCP) - avoid tobacco / alcohol; reviewed nutrition plan - consider OTC capsaicin cream, lidocaine  patch / cream, alpha-lipoic acid 600mg  daily  NUMBNESS / PAIN (hands; possible intermittent compression neuropathy at wrists i.e. carpal tunnel syndrome) - resolved; monitor for now  Orders Placed This Encounter  Procedures   Ambulatory referral to Physical Therapy   Return for pending if symptoms worsen or fail to improve, return to referring provider.    EDUARD FABIENE HANLON, MD 04/23/2024, 2:31 PM Certified in Neurology, Neurophysiology and Neuroimaging  [facility] [address], [address] [place], [place] [address] [phone]

## 2024-04-26 ENCOUNTER — Other Ambulatory Visit: Payer: Self-pay

## 2024-04-26 ENCOUNTER — Other Ambulatory Visit (HOSPITAL_COMMUNITY): Payer: Self-pay

## 2024-04-27 ENCOUNTER — Other Ambulatory Visit: Payer: Self-pay

## 2024-04-28 ENCOUNTER — Ambulatory Visit: Admitting: Diagnostic Neuroimaging

## 2024-05-10 ENCOUNTER — Other Ambulatory Visit: Payer: Self-pay | Admitting: Family Medicine

## 2024-05-10 ENCOUNTER — Other Ambulatory Visit: Payer: Self-pay

## 2024-05-10 ENCOUNTER — Other Ambulatory Visit (HOSPITAL_COMMUNITY): Payer: Self-pay

## 2024-05-10 DIAGNOSIS — G5793 Unspecified mononeuropathy of bilateral lower limbs: Secondary | ICD-10-CM

## 2024-05-10 DIAGNOSIS — E1149 Type 2 diabetes mellitus with other diabetic neurological complication: Secondary | ICD-10-CM

## 2024-05-10 MED ORDER — DULOXETINE HCL 60 MG PO CPEP
60.0000 mg | ORAL_CAPSULE | Freq: Every day | ORAL | 3 refills | Status: DC
  Filled 2024-05-10 – 2024-05-11 (×2): qty 30, 30d supply, fill #0

## 2024-05-11 ENCOUNTER — Other Ambulatory Visit (HOSPITAL_COMMUNITY): Payer: Self-pay

## 2024-05-11 ENCOUNTER — Other Ambulatory Visit: Payer: Self-pay

## 2024-05-11 MED ORDER — OFLOXACIN 0.3 % OP SOLN
1.0000 [drp] | Freq: Four times a day (QID) | OPHTHALMIC | 1 refills | Status: AC
  Filled 2024-05-11: qty 5, 7d supply, fill #0

## 2024-05-11 MED ORDER — PREDNISOLONE ACETATE 1 % OP SUSP
1.0000 [drp] | Freq: Four times a day (QID) | OPHTHALMIC | 5 refills | Status: AC
  Filled 2024-05-11: qty 10, 28d supply, fill #0

## 2024-05-12 ENCOUNTER — Other Ambulatory Visit: Payer: Self-pay | Admitting: Family Medicine

## 2024-05-12 ENCOUNTER — Other Ambulatory Visit (HOSPITAL_COMMUNITY): Payer: Self-pay

## 2024-05-12 DIAGNOSIS — I10 Essential (primary) hypertension: Secondary | ICD-10-CM

## 2024-05-12 MED ORDER — CARVEDILOL 25 MG PO TABS
37.5000 mg | ORAL_TABLET | Freq: Two times a day (BID) | ORAL | 5 refills | Status: AC
  Filled 2024-05-12: qty 180, 60d supply, fill #0

## 2024-05-14 ENCOUNTER — Ambulatory Visit: Admitting: Family Medicine

## 2024-05-20 ENCOUNTER — Ambulatory Visit: Payer: Self-pay | Admitting: Family Medicine

## 2024-05-20 DIAGNOSIS — G4733 Obstructive sleep apnea (adult) (pediatric): Secondary | ICD-10-CM | POA: Diagnosis not present

## 2024-05-24 ENCOUNTER — Other Ambulatory Visit: Payer: Self-pay

## 2024-05-24 ENCOUNTER — Ambulatory Visit (INDEPENDENT_AMBULATORY_CARE_PROVIDER_SITE_OTHER): Admitting: Family Medicine

## 2024-05-24 ENCOUNTER — Encounter: Payer: Self-pay | Admitting: Pharmacist

## 2024-05-24 ENCOUNTER — Encounter: Payer: Self-pay | Admitting: Family Medicine

## 2024-05-24 ENCOUNTER — Other Ambulatory Visit (HOSPITAL_COMMUNITY): Payer: Self-pay

## 2024-05-24 VITALS — BP 160/100 | HR 79 | Wt 302.6 lb

## 2024-05-24 DIAGNOSIS — G4700 Insomnia, unspecified: Secondary | ICD-10-CM

## 2024-05-24 DIAGNOSIS — E782 Mixed hyperlipidemia: Secondary | ICD-10-CM | POA: Diagnosis not present

## 2024-05-24 DIAGNOSIS — J0100 Acute maxillary sinusitis, unspecified: Secondary | ICD-10-CM | POA: Diagnosis not present

## 2024-05-24 DIAGNOSIS — I10 Essential (primary) hypertension: Secondary | ICD-10-CM

## 2024-05-24 MED ORDER — ASPIRIN 81 MG PO TBEC
81.0000 mg | DELAYED_RELEASE_TABLET | Freq: Every day | ORAL | 2 refills | Status: AC
  Filled 2024-05-24 – 2024-06-08 (×2): qty 90, 90d supply, fill #0

## 2024-05-24 MED ORDER — DULOXETINE HCL 30 MG PO CPEP
30.0000 mg | ORAL_CAPSULE | Freq: Every day | ORAL | 3 refills | Status: AC
  Filled 2024-05-24 – 2024-06-08 (×2): qty 30, 30d supply, fill #0
  Filled 2024-07-06: qty 30, 30d supply, fill #1

## 2024-05-24 MED ORDER — AMOXICILLIN-POT CLAVULANATE 875-125 MG PO TABS
1.0000 | ORAL_TABLET | Freq: Two times a day (BID) | ORAL | 0 refills | Status: DC
  Filled 2024-05-24 – 2024-06-08 (×3): qty 20, 10d supply, fill #0

## 2024-05-24 MED ORDER — SPIRONOLACTONE 25 MG PO TABS
25.0000 mg | ORAL_TABLET | Freq: Every day | ORAL | 3 refills | Status: AC
  Filled 2024-05-24 – 2024-06-08 (×2): qty 90, 90d supply, fill #0

## 2024-05-24 MED ORDER — MELATONIN 5 MG PO TABS
10.0000 mg | ORAL_TABLET | Freq: Every evening | ORAL | 1 refills | Status: AC | PRN
  Filled 2024-05-24 – 2024-06-08 (×3): qty 30, 15d supply, fill #0
  Filled 2024-07-06: qty 30, 15d supply, fill #1

## 2024-05-24 MED ORDER — ATORVASTATIN CALCIUM 40 MG PO TABS
40.0000 mg | ORAL_TABLET | Freq: Every day | ORAL | 3 refills | Status: AC
  Filled 2024-05-24 – 2024-06-08 (×3): qty 90, 90d supply, fill #0

## 2024-05-24 NOTE — Patient Instructions (Addendum)
 Good to see you today - Thank you for coming in  Things we discussed today:   Please come back in 1 week to get labwork after starting this blood pressure medicine.   START: Spironolactone , Augmentin   CHANGE: We are decreasing your Duloxetine  dose at your request. I am sending the new dose.

## 2024-05-24 NOTE — Assessment & Plan Note (Signed)
 Refilled atorvastatin , aspirin 

## 2024-05-24 NOTE — Assessment & Plan Note (Signed)
 Refilled losartan  hydrochlorothiazide  100-25 g daily Start spironolactone  25 mg daily.  She is allergic to lisinopril  and amlodipine  caused swelling. She will make an appointment for next week to get BMP/recheck BP, could not wait for labs today

## 2024-05-24 NOTE — Progress Notes (Signed)
" ° ° °  SUBJECTIVE:   CHIEF COMPLAINT / HPI:   Nasal congestion, productive cough for >10d Facial pain around nose No sore throat, no ear pain, SOB  Duloxetine  is making her very hungry.  She would like to decrease the dose.  She has had a lot of life stressors including her sister passing she attributes this to her elevated BP  PERTINENT  PMH / PSH: HTN, CAD, OSA, GERD, T2DM, HLD  OBJECTIVE:   BP (!) 160/100   Pulse 79   Wt (!) 302 lb 9.6 oz (137.3 kg)   LMP 11/21/2012   SpO2 96%   BMI 47.39 kg/m   General: well appearing, NAD HEENT: Bilateral Tms occluded by cerumen. TTP over maxillary sinuses.  Nasal congestion present.  Oropharynx clear. Cardiovascular: RRR, no m/r/g Respiratory: normal work of breathing on RA, CTAB  ASSESSMENT/PLAN:   Assessment & Plan Acute non-recurrent maxillary sinusitis >10d of sinus pain and congestion Multiple comorbidities Will rx Augmentin  875 BID Essential hypertension Refilled losartan  hydrochlorothiazide  100-25 g daily Start spironolactone  25 mg daily.  She is allergic to lisinopril  and amlodipine  caused swelling. She will make an appointment for next week to get BMP/recheck BP, could not wait for labs today Mixed hyperlipidemia Refilled atorvastatin , aspirin  Insomnia, unspecified type Decreased duloxetine  from 60mg  to 30mg  at pt request, it is increasing hunger Refilled melatonin     Elyce Prescott, DO [facility] "

## 2024-05-24 NOTE — Assessment & Plan Note (Signed)
 Decreased duloxetine  from 60mg  to 30mg  at pt request, it is increasing hunger Refilled melatonin

## 2024-05-28 ENCOUNTER — Other Ambulatory Visit: Payer: Self-pay

## 2024-05-29 ENCOUNTER — Other Ambulatory Visit (HOSPITAL_COMMUNITY): Payer: Self-pay

## 2024-05-30 NOTE — Progress Notes (Signed)
 " Assessment   1. Antibiotic-associated diarrhea   2. Uncontrolled hypertension      Plan  She will discontinue the Augmentin  that she is currently on.  She will increase her water and fluid intake by sipping of fluids regularly throughout the day to help prevent dehydration.  She will take the medication prescribed today as directed.  Discussed her elevated blood pressure reading with her.  She has a prior history of uncontrolled hypertension and her primary care provider just prescribed a new blood pressure medicine last week but she has not gotten it from mail-order yet.  She will start this as soon as she receives it.  She will continue her regular blood pressure medications without missing or skipping doses.  She will follow-up with her primary care physician next week regarding her blood pressure as well as the need for any more antibiotics for her sinus infection.  She will go immediately to the emergency department for worsening symptoms such as fever, abdominal pain, dizziness, severe headache, uncontrollable diarrhea or bloody diarrhea.  The patient knowledges understanding of and agreement with this plan of care.   Patient's Medications       * Accurate as of May 30, 2024 11:46 AM. Reflects encounter med changes as of last refresh          New Prescriptions      Instructions  diphenoxylate-atropine 2.5-0.025 mg per tablet Commonly known as: LOMOTIL Started by: Norleen Croak, DO  1 tablet, Oral, 4 times a day as needed   metroNIDAZOLE 500 mg tablet Commonly known as: FLAGYL Started by: John Holland, DO  500 mg, Oral, 3 times a day       Continued Medications      Instructions  aspirin  EC tablet Commonly known as: ECOTRIN LOW DOSE  81 mg, Daily   atorvastatin  40 mg tablet Commonly known as: LIPITOR  40 mg, Daily   carvedilol  25 mg tablet Commonly known as: COREG   37.5 mg, 2 times daily with meals   DULoxetine  HCl 30 mg capsule Commonly known as:  CYMBALTA   30 mg, Daily   losartan -hydrochlorothiazide  100-25 MG per tablet Commonly known as: HYZAAR   1 tablet, Daily        No orders of the defined types were placed in this encounter.    Complexity Considered antibiotics and discussed stewardship with patient due to etiology (Rx equivalent)   Comorbidities Discussed with [name] that she is at an increased risk of complications from her diagnosis as a result of her current complaint and her relevant comorbidities: uncontrolled hypertension. These comorbidities could be worsened by her current diagnosis. Virginia Levine  will continue the current treatment plan provided by her provider. New medications prescribed today are listed above.   Problems Acute uncomplicated illness/injury (3) and Chronic condition NOT at treatment goal (4)  Data No significant data or note reviewed (2)  Risk Prescription medication or discussion against use (4)            Norleen JAYSON Croak, DO     Subjective   Virginia Levine is a 58 y.o. female who presents for evaluation of a 2-day history of frequent watery yellow diarrhea after having been started on Augmentin  875 mg twice daily for a sinus infection on 05/25/2024 by her primary care provider.  She denies any abdominal pain.  She denies any blood in the diarrhea.  She denies any fever.  She is still having some sinus symptoms with purulent nasal discharge.  Her doctor's office has  called in a different antibiotic but she has not started it yet.  Her blood pressure is quite elevated here.  She was also started on an additional blood pressure medication (spironolactone ) at her visit on 05/25/2024 with her primary care but has not received it from mail-order yet.  She does claim compliance with her losartan , carvedilol , and hydrochlorothiazide .  She denies any headaches, dizziness, chest pain, shortness of breath.  History reviewed. No pertinent past medical history.   Current Medications[1]   Allergies[2]    Social History[3]    Objective   BP (!) 201/100   Temp 98 F (36.7 C) (Tympanic)   Resp 16   Ht 5' 7 (1.702 m)   Wt 297 lb 6.4 oz (134.9 kg)   LMP  (LMP Unknown)   SpO2 98%   Breastfeeding No   BMI 46.58 kg/m  Full PPE worn during encounter.  General appearance: alert, appears stated age, cooperative and in no distress. Head: Normocephalic, without obvious abnormality, atraumatic Eyes: conjunctivae/corneas clear. PERRLA.  No discharge. Lungs: clear to auscultation bilaterally.  No rales, rhonchi or wheezing. Heart: regular rate and rhythm, S1, S2 normal, no murmur, click, rub or gallop Abdomen: Obese, soft, nontender, mildly hyperactive bowel sounds, no masses organomegaly. Skin: Skin color, texture, turgor normal. No rashes or lesions    No results found for this or any previous visit (from the past 14 hours).  Radiology imaging No results found.        [1]  Current Outpatient Medications:    aspirin  (ECOTRIN LOW DOSE) EC tablet, Take one tablet (81 mg dose) by mouth daily., Disp: , Rfl:    atorvastatin  (LIPITOR) 40 mg tablet, Take one tablet (40 mg dose) by mouth daily., Disp: , Rfl:    carvedilol  (COREG ) 25 mg tablet, Take one and a half tablets (37.5 mg dose) by mouth 2 (two) times a day with meals., Disp: , Rfl:    diphenoxylate-atropine (LOMOTIL) 2.5-0.025 mg per tablet, Take one tablet by mouth 4 (four) times a day as needed for Diarrhea. Max Daily Amount: 4 tablets, Disp: 30 tablet, Rfl: 0   DULoxetine  HCl (CYMBALTA ) 30 mg capsule, Take one capsule (30 mg dose) by mouth daily., Disp: , Rfl:    losartan -hydrochlorothiazide  (HYZAAR ) 100-25 MG per tablet, Take one tablet by mouth daily., Disp: , Rfl:    metroNIDAZOLE (FLAGYL) 500 mg tablet, Take one tablet (500 mg dose) by mouth 3 (three) times a day for 7 days., Disp: 21 tablet, Rfl: 0 [2] Allergies Allergen Reactions   Lisinopril  Swelling, Other and Rash    Presented to ED with angioedema 12/2015,  Angioedema / Hair loss/burned scalp  Presented to ED with angioedema 12/2015  Angioedema / Hair loss/burned scalp  Angioedema / Hair loss/burned scalp  Presented to ED with angioedema 12/2015  [3] Social History Socioeconomic History   Marital status: Unknown  Tobacco Use   Smoking status: Every Day    Types: Cigarettes   Smokeless tobacco: Never  *Some images could not be shown."

## 2024-05-31 ENCOUNTER — Other Ambulatory Visit (HOSPITAL_COMMUNITY): Payer: Self-pay

## 2024-05-31 ENCOUNTER — Other Ambulatory Visit: Payer: Self-pay

## 2024-06-01 ENCOUNTER — Other Ambulatory Visit: Payer: Self-pay

## 2024-06-01 ENCOUNTER — Encounter: Payer: Self-pay | Admitting: Pharmacist

## 2024-06-07 ENCOUNTER — Other Ambulatory Visit: Payer: Self-pay

## 2024-06-08 ENCOUNTER — Other Ambulatory Visit (HOSPITAL_COMMUNITY): Payer: Self-pay

## 2024-06-10 NOTE — Assessment & Plan Note (Signed)
 Patient's blood pressure {is/is not:320031} controlled today.  . Goal of {BPGOAL:29904}. Patient's medication regimen includes ***. - Changes to current regimen include *** - Referral to pharmacy clinic due to {KOVALBPCRITERIA:29903} - Labs: BMP, urine microalbumin/creatinine *** - BP log added to patient's AVS*** - Follow up in *** weeks

## 2024-06-10 NOTE — Progress Notes (Unsigned)
" ° ° °  SUBJECTIVE:   CHIEF COMPLAINT / HPI:   HTN - Losartan -HCTZ 100-25 refilled at last visit and patient was started on Spironolactone  25 mg daily on 12/22 - Unable to take ACEi d/t swelling and couldn't tolerate the swelling with Amlodipine  either  PERTINENT  PMH / PSH:  HTN, CAD, OSA, GERD, T2DM, HLD  OBJECTIVE:   LMP 11/21/2012   General: Awake and Alert in NAD HEENT: NCAT. Sclera anicteric. No rhinorrhea. Cardiovascular: RRR. No M/R/G Respiratory: CTAB, normal WOB on RA. No wheezing, crackles, rhonchi, or diminished breath sounds. Abdomen: Soft, non-tender, non-distended. Bowel sounds normoactive/hypoactive/hyperactive. *** Extremities: Able to move all extremities. No BLE edema, no deformities or significant joint findings. Skin: Warm and dry. No abrasions or rashes noted. Neuro: A&Ox***. No focal neurological deficits.  ASSESSMENT/PLAN:   Assessment & Plan Essential hypertension Patient's blood pressure {is/is not:320031} controlled today.  . Goal of {BPGOAL:29904}. Patient's medication regimen includes ***. - Changes to current regimen include *** - Referral to pharmacy clinic due to {KOVALBPCRITERIA:29903} - Labs: BMP, urine microalbumin/creatinine *** - BP log added to patient's AVS*** - Follow up in *** weeks    Kathrine Melena, DO [facility] "

## 2024-06-11 ENCOUNTER — Ambulatory Visit: Payer: Self-pay | Admitting: Family Medicine

## 2024-06-11 ENCOUNTER — Other Ambulatory Visit (HOSPITAL_COMMUNITY): Payer: Self-pay

## 2024-06-16 NOTE — Progress Notes (Signed)
" ° ° °  SUBJECTIVE:   CHIEF COMPLAINT / HPI:   HTN - Losartan -HCTZ 100-25 refilled at last visit and patient was started on Spironolactone  25 mg daily on 12/22. Started Spironolactone  on 1/8. - Unable to take ACEi d/t swelling and couldn't tolerate the swelling with Amlodipine  either - Feels like the Losartan  is increasing her BP - Eats chicken and fish, but trying to improve her eating habits.   Recently lost her sister in December, has been coping relatively okay since.  Initially was drinking a lot but shares that she has stopped doing so in the past week.  Congratulated patient on this!  Briefly mentioned some low back pain that is nonradiating.  She still has good motion.  Advised patient to follow-up to address this at another visit.  PERTINENT  PMH / PSH:  HTN, CAD, OSA, GERD, T2DM, HLD  OBJECTIVE:   BP (!) 159/76   Pulse 81   Wt (!) 302 lb (137 kg)   LMP 11/21/2012   SpO2 97%   BMI 47.30 kg/m   General: Awake and Alert in NAD HEENT: NCAT. Sclera anicteric. No rhinorrhea. Cardiovascular: RRR. No M/R/G Respiratory: CTAB, normal WOB on RA. No wheezing, crackles, rhonchi, or diminished breath sounds. Abdomen: Soft, non-tender, non-distended. Bowel sounds normoactive Extremities: Able to move all extremities. 1+ BLE edema, no deformities or significant joint findings. Skin: Warm and dry. No abrasions or rashes noted. Neuro: A&Ox3. No focal neurological deficits.  ASSESSMENT/PLAN:   Assessment & Plan Essential hypertension Patient's blood pressure is not controlled today. BP: (!) 159/76. Goal of 140/90. Patient's medication regimen includes Hyzaar  100-25 daily and Spironolactone  225 mg daily, Coreg  37.5 mg BID - Changes to current regimen include: None made today since she recently started taking the spironolactone  a week ago - Referral to pharmacy clinic due to On 3 or more antihypertensives and still not well controlled - Labs: BMP, urine microalbumin/creatinine collected  today - Follow up in 2 weeks with Dr. Amalia Type 2 diabetes mellitus with diabetic neuropathy, without long-term current use of insulin  (HCC) - Last A1c 6.5 in 08/2023, 6.1 today - Eye exam: Recommended today - Foot exam: Attempted to perform today, however patient has difficulty removing/replacing these socks - Microalbumin: Performed today - Statin: Lipitor 40 mg daily   Previn Jian, DO [facility] "

## 2024-06-16 NOTE — Assessment & Plan Note (Signed)
 Patient's blood pressure {is/is not:320031} controlled today.  . Goal of {BPGOAL:29904}. Patient's medication regimen includes ***. - Changes to current regimen include: Hyzaar  100-25 daily and Spironolactone  225 mg daily - Referral to pharmacy clinic due to {KOVALBPCRITERIA:29903} - Labs: BMP, urine microalbumin/creatinine - BP log added to patient's AVS*** - Follow up in *** weeks

## 2024-06-18 ENCOUNTER — Encounter: Payer: Self-pay | Admitting: Family Medicine

## 2024-06-18 ENCOUNTER — Ambulatory Visit: Admitting: Family Medicine

## 2024-06-18 VITALS — BP 159/76 | HR 81 | Wt 302.0 lb

## 2024-06-18 DIAGNOSIS — E1140 Type 2 diabetes mellitus with diabetic neuropathy, unspecified: Secondary | ICD-10-CM

## 2024-06-18 DIAGNOSIS — I10 Essential (primary) hypertension: Secondary | ICD-10-CM

## 2024-06-18 LAB — POCT GLYCOSYLATED HEMOGLOBIN (HGB A1C): HbA1c, POC (controlled diabetic range): 6.1 % (ref 0.0–7.0)

## 2024-06-18 NOTE — Patient Instructions (Signed)
 It was great to see you today! Thank you for choosing [facility] for your primary care. Virginia Levine was seen for blood pressure.  Please bring ALL of your medications with you to every visit.   Today we addressed: Blood Pressure - we scheduled you an appt with Dr. Koval (clinical pharmacist) on Friday Jan 30th at 8:30 AM. Please make sure to bring in all your medications. Keep taking the Coreg , Losartan -hydrochlorothiazide , and Spironolactone  as prescribed.  Diabetes - Keep up the great work! Follow up with eye doctor for diabetic eye exam.  We are checking some labs today. I will send you a MyChart message with your results, per your preference. If you do not hear about your labs in the next 2 weeks, please call the office.   You should return to our clinic No follow-ups on file. Please arrive 15 minutes before your appointment to ensure smooth check in process.  We appreciate your efforts in making this happen.  Thank you for allowing me to participate in your care, Kathrine Melena, DO 06/18/2024, 8:42 AM PGY-2, [facility]

## 2024-06-18 NOTE — Assessment & Plan Note (Addendum)
-   Last A1c 6.5 in 08/2023, 6.1 today - Eye exam: Recommended today - Foot exam: Attempted to perform today, however patient has difficulty removing/replacing these socks - Microalbumin: Performed today - Statin: Lipitor 40 mg daily

## 2024-06-19 ENCOUNTER — Ambulatory Visit: Payer: Self-pay | Admitting: Family Medicine

## 2024-06-19 LAB — BASIC METABOLIC PANEL WITH GFR
BUN/Creatinine Ratio: 18 (ref 12–28)
BUN: 12 mg/dL (ref 8–27)
CO2: 24 mmol/L (ref 20–29)
Calcium: 10 mg/dL (ref 8.7–10.3)
Chloride: 99 mmol/L (ref 96–106)
Creatinine, Ser: 0.66 mg/dL (ref 0.57–1.00)
Glucose: 104 mg/dL — ABNORMAL HIGH (ref 70–99)
Potassium: 3.8 mmol/L (ref 3.5–5.2)
Sodium: 140 mmol/L (ref 134–144)
eGFR: 100 mL/min/1.73

## 2024-06-19 LAB — MICROALBUMIN / CREATININE URINE RATIO
Creatinine, Urine: 156.7 mg/dL
Microalb/Creat Ratio: 8 mg/g{creat} (ref 0–29)
Microalbumin, Urine: 12.1 ug/mL

## 2024-07-02 ENCOUNTER — Ambulatory Visit: Admitting: Pharmacist

## 2024-07-02 ENCOUNTER — Telehealth: Payer: Self-pay | Admitting: Pharmacist

## 2024-07-06 ENCOUNTER — Other Ambulatory Visit (HOSPITAL_COMMUNITY): Payer: Self-pay

## 2024-07-23 ENCOUNTER — Ambulatory Visit: Admitting: Pharmacist
